# Patient Record
Sex: Male | Born: 1945
Health system: Southern US, Community
[De-identification: ages and names within clinical notes are randomized; demographics above are authoritative.]

## PROBLEM LIST (undated history)

## (undated) DIAGNOSIS — Z973 Presence of spectacles and contact lenses: Secondary | ICD-10-CM

## (undated) DIAGNOSIS — Z960 Presence of urogenital implants: Secondary | ICD-10-CM

## (undated) DIAGNOSIS — Z978 Presence of other specified devices: Secondary | ICD-10-CM

## (undated) DIAGNOSIS — I10 Essential (primary) hypertension: Secondary | ICD-10-CM

## (undated) DIAGNOSIS — N400 Enlarged prostate without lower urinary tract symptoms: Secondary | ICD-10-CM

## (undated) DIAGNOSIS — M1990 Unspecified osteoarthritis, unspecified site: Secondary | ICD-10-CM

## (undated) DIAGNOSIS — E785 Hyperlipidemia, unspecified: Secondary | ICD-10-CM

## (undated) DIAGNOSIS — E119 Type 2 diabetes mellitus without complications: Secondary | ICD-10-CM

## (undated) DIAGNOSIS — K219 Gastro-esophageal reflux disease without esophagitis: Secondary | ICD-10-CM

## (undated) DIAGNOSIS — R338 Other retention of urine: Secondary | ICD-10-CM

## (undated) HISTORY — PX: CATARACT EXTRACTION W/ INTRAOCULAR LENS IMPLANT: SHX1309

## (undated) HISTORY — PX: INGUINAL HERNIA REPAIR: SUR1180

## (undated) HISTORY — PX: RETINAL DETACHMENT SURGERY: SHX105

---

## 2000-09-16 ENCOUNTER — Ambulatory Visit (HOSPITAL_BASED_OUTPATIENT_CLINIC_OR_DEPARTMENT_OTHER): Admission: RE | Admit: 2000-09-16 | Discharge: 2000-09-16 | Payer: Self-pay | Admitting: Internal Medicine

## 2011-01-06 DIAGNOSIS — E113553 Type 2 diabetes mellitus with stable proliferative diabetic retinopathy, bilateral: Secondary | ICD-10-CM | POA: Diagnosis present

## 2015-12-03 ENCOUNTER — Encounter (HOSPITAL_BASED_OUTPATIENT_CLINIC_OR_DEPARTMENT_OTHER): Payer: Self-pay | Admitting: *Deleted

## 2015-12-03 ENCOUNTER — Emergency Department (HOSPITAL_BASED_OUTPATIENT_CLINIC_OR_DEPARTMENT_OTHER)
Admission: EM | Admit: 2015-12-03 | Discharge: 2015-12-03 | Disposition: A | Discharge status: Home / Self Care | Payer: BLUE CROSS/BLUE SHIELD | Attending: Emergency Medicine | Admitting: Emergency Medicine

## 2015-12-03 DIAGNOSIS — R339 Retention of urine, unspecified: Secondary | ICD-10-CM | POA: Diagnosis not present

## 2015-12-03 DIAGNOSIS — Z79899 Other long term (current) drug therapy: Secondary | ICD-10-CM | POA: Insufficient documentation

## 2015-12-03 DIAGNOSIS — I10 Essential (primary) hypertension: Secondary | ICD-10-CM | POA: Diagnosis not present

## 2015-12-03 DIAGNOSIS — R338 Other retention of urine: Secondary | ICD-10-CM

## 2015-12-03 DIAGNOSIS — E119 Type 2 diabetes mellitus without complications: Secondary | ICD-10-CM | POA: Insufficient documentation

## 2015-12-03 DIAGNOSIS — Z7984 Long term (current) use of oral hypoglycemic drugs: Secondary | ICD-10-CM | POA: Diagnosis not present

## 2015-12-03 HISTORY — DX: Essential (primary) hypertension: I10

## 2015-12-03 LAB — URINE MICROSCOPIC-ADD ON
RBC / HPF: NONE SEEN RBC/hpf (ref 0–5)
Squamous Epithelial / LPF: NONE SEEN
WBC, UA: NONE SEEN WBC/hpf (ref 0–5)

## 2015-12-03 LAB — URINALYSIS, ROUTINE W REFLEX MICROSCOPIC
Bilirubin Urine: NEGATIVE
Hgb urine dipstick: NEGATIVE
Ketones, ur: NEGATIVE mg/dL
LEUKOCYTES UA: NEGATIVE
Nitrite: NEGATIVE
PH: 5.5 (ref 5.0–8.0)
Protein, ur: NEGATIVE mg/dL
Specific Gravity, Urine: 1.025 (ref 1.005–1.030)

## 2015-12-03 NOTE — ED Provider Notes (Signed)
[place] DEPT [facility] Provider Note   CSN: [id number] Arrival date & time: 12/03/15  0438     History   Chief Complaint Chief Complaint  Patient presents with  . Urinary Retention    HPI David Sharp is a 70 y.o. male with a history of BPH. He is here with a 2 day history of inability to urinate. His bladder discomfort and urge to urinate have worsened and he presents with moderate discomfort. He denies fever, chills, nausea, vomiting or diarrhea. He has no previous history of urinary retention. He denies any new prescription or over-the-counter medications that may have precipitated this. Bladder scan performed by nursing staff indicates retention of over 700 milliliters of urine.  HPI  Past Medical History:  Diagnosis Date  . Diabetes mellitus without complication ([place])   . Hypertension     There are no active problems to display for this patient.   History reviewed. No pertinent surgical history.     Home Medications    Prior to Admission medications   Medication Sig Start Date End Date Taking? Authorizing Provider  lisinopril (PRINIVIL,ZESTRIL) 10 MG tablet Take 10 mg by mouth daily.   Yes Historical Provider, MD  metFORMIN (GLUCOPHAGE) 500 MG tablet Take by mouth 2 (two) times daily with a meal.   Yes Historical Provider, MD    Family History No family history on file.  Social History Social History  Substance Use Topics  . Smoking status: Not on file  . Smokeless tobacco: Not on file  . Alcohol use Not on file     Allergies   Review of patient's allergies indicates no known allergies.   Review of Systems Review of Systems  All other systems reviewed and are negative.   Physical Exam Updated Vital Signs BP 149/61 (BP Location: Left Arm)   Pulse 82   Temp 98.2 F (36.8 C) (Oral)   Resp 20   Ht 6\' 2"  (1.88 m)   Wt 280 lb (127 kg)   SpO2 99%   BMI 35.95 kg/m   Physical Exam General: Well-developed, well-nourished male in no acute  distress; appearance consistent with age of record HENT: normocephalic; atraumatic Eyes: pupils equal, round and reactive to light; extraocular muscles intact Neck: supple Heart: regular rate and rhythm Lungs: clear to auscultation bilaterally Abdomen: soft; distended bladder; bowel sounds present Extremities: No deformity; full range of motion; pulses normal Neurologic: Awake, alert and oriented; motor function intact in all extremities and symmetric; no facial droop Skin: Warm and dry Psychiatric: Normal mood and affect   ED Treatments / Results  Nursing notes and vitals signs, including pulse oximetry, reviewed.  Summary of this visit's results, reviewed by myself:  Labs:  Results for orders placed or performed during the hospital encounter of 12/03/15 (from the past 24 hour(s))  Urinalysis, Routine w reflex microscopic (not at [facility])     Status: Abnormal   Collection Time: 12/03/15  5:34 AM  Result Value Ref Range   Color, Urine YELLOW YELLOW   APPearance CLEAR CLEAR   Specific Gravity, Urine 1.025 1.005 - 1.030   pH 5.5 5.0 - 8.0   Glucose, UA >1000 (A) NEGATIVE mg/dL   Hgb urine dipstick NEGATIVE NEGATIVE   Bilirubin Urine NEGATIVE NEGATIVE   Ketones, ur NEGATIVE NEGATIVE mg/dL   Protein, ur NEGATIVE NEGATIVE mg/dL   Nitrite NEGATIVE NEGATIVE   Leukocytes, UA NEGATIVE NEGATIVE  Urine microscopic-add on     Status: Abnormal   Collection Time: 12/03/15  5:34  AM  Result Value Ref Range   Squamous Epithelial / LPF NONE SEEN NONE SEEN   WBC, UA NONE SEEN 0 - 5 WBC/hpf   RBC / HPF NONE SEEN 0 - 5 RBC/hpf   Bacteria, UA FEW (A) NONE SEEN   Casts HYALINE CASTS (A) NEGATIVE   Urine-Other MUCOUS PRESENT     5:31 AM Foley catheter placed by nursing staff, more than 1000 milliliters of clear yellow urine obtained.  Procedures (including critical care time)  Final Clinical Impressions(s) / ED Diagnoses   Final diagnoses:  Acute urinary retention       Shanon Rosser,  MD 12/03/15 [phone]

## 2015-12-03 NOTE — ED Triage Notes (Signed)
Unable to urinate since Sunday am

## 2015-12-14 ENCOUNTER — Encounter (HOSPITAL_COMMUNITY): Payer: Self-pay | Admitting: Emergency Medicine

## 2015-12-14 ENCOUNTER — Emergency Department (HOSPITAL_COMMUNITY)
Admission: EM | Admit: 2015-12-14 | Discharge: 2015-12-14 | Disposition: A | Discharge status: Home / Self Care | Payer: BLUE CROSS/BLUE SHIELD | Attending: Emergency Medicine | Admitting: Emergency Medicine

## 2015-12-14 DIAGNOSIS — Z79899 Other long term (current) drug therapy: Secondary | ICD-10-CM | POA: Diagnosis not present

## 2015-12-14 DIAGNOSIS — I10 Essential (primary) hypertension: Secondary | ICD-10-CM | POA: Diagnosis not present

## 2015-12-14 DIAGNOSIS — E119 Type 2 diabetes mellitus without complications: Secondary | ICD-10-CM | POA: Diagnosis not present

## 2015-12-14 DIAGNOSIS — R339 Retention of urine, unspecified: Secondary | ICD-10-CM | POA: Diagnosis present

## 2015-12-14 LAB — URINE MICROSCOPIC-ADD ON

## 2015-12-14 LAB — URINALYSIS, ROUTINE W REFLEX MICROSCOPIC
Bilirubin Urine: NEGATIVE
GLUCOSE, UA: 250 mg/dL — AB
Ketones, ur: NEGATIVE mg/dL
Nitrite: NEGATIVE
PH: 5.5 (ref 5.0–8.0)
PROTEIN: 30 mg/dL — AB
SPECIFIC GRAVITY, URINE: 1.014 (ref 1.005–1.030)

## 2015-12-14 MED ORDER — CIPROFLOXACIN HCL 500 MG PO TABS
500.0000 mg | ORAL_TABLET | Freq: Once | ORAL | Status: AC
  Administered 2015-12-14: 500 mg via ORAL
  Filled 2015-12-14: qty 1

## 2015-12-14 MED ORDER — CIPROFLOXACIN HCL 500 MG PO TABS: 500.0000 mg | ORAL_TABLET | Freq: Two times a day (BID) | ORAL | 0 refills | Status: DC

## 2015-12-14 NOTE — ED Notes (Signed)
>  970ml showed on the bladder scan.

## 2015-12-14 NOTE — ED Provider Notes (Signed)
[place] DEPT Provider Note   CSN: [id number] Arrival date & time: 12/14/15  1457     History   Chief Complaint Chief Complaint  Patient presents with  . Urinary Retention    HPI David Sharp is a 70 y.o. male.  HPI   70 year old male presents today with complaints of urinary retention. Patient reports that he was seen on 12/03/2015 emergency room for acute urinary retention. Patient reports he followed up with [facility], was started on Rapaflo at that time. He was again seen on September 12 with catheter removal. He notes the following day he had a urinary tract infection was started on Bactrim. Patient notes that he was able to urinate after that but has still had some difficulties. Patient reports over the last day he has decreased urine output; very little output this morning. Patient notes developing suprapubic pain radiates to his back. Denies any fever or chills nausea or vomiting, upper abdominal pain. Patient denies any history of kidney stones, denies any history of cancer.   Past Medical History:  Diagnosis Date  . Diabetes mellitus without complication ([place])   . Hypertension   . Renal disorder     There are no active problems to display for this patient.   History reviewed. No pertinent surgical history.     Home Medications    Prior to Admission medications   Medication Sig Start Date End Date Taking? Authorizing Provider  amLODipine (NORVASC) 5 MG tablet Take 5 mg by mouth daily. for blood pressure 12/04/15  Yes Historical Provider, MD  atorvastatin (LIPITOR) 10 MG tablet Take 10 mg by mouth every evening.  12/04/15  Yes Historical Provider, MD  glyBURIDE-metformin (GLUCOVANCE) 2.5-500 MG tablet Take 1 tablet by mouth 2 (two) times daily. 11/18/15  Yes Historical Provider, MD  lisinopril (PRINIVIL,ZESTRIL) 20 MG tablet Take 1 tablet by mouth every morning. 11/12/15  Yes Historical Provider, MD  pioglitazone (ACTOS) 45 MG tablet Take 45 mg by mouth  every evening.  12/04/15  Yes Historical Provider, MD  ciprofloxacin (CIPRO) 500 MG tablet Take 1 tablet (500 mg total) by mouth every 12 (twelve) hours. 12/14/15   Okey Regal, PA-C    Family History No family history on file.  Social History Social History  Substance Use Topics  . Smoking status: Not on file  . Smokeless tobacco: Not on file  . Alcohol use Not on file     Allergies   Review of patient's allergies indicates no known allergies.   Review of Systems Review of Systems  All other systems reviewed and are negative.    Physical Exam Updated Vital Signs BP (!) 160/53   Pulse 78   Temp 98.3 F (36.8 C) (Oral)   Resp 19   SpO2 100%   Physical Exam  Constitutional: He is oriented to person, place, and time. He appears well-developed and well-nourished.  HENT:  Head: Normocephalic and atraumatic.  Eyes: Conjunctivae are normal. Pupils are equal, round, and reactive to light. Right eye exhibits no discharge. Left eye exhibits no discharge. No scleral icterus.  Neck: Normal range of motion. No JVD present. No tracheal deviation present.  Pulmonary/Chest: Effort normal. No stridor.  Abdominal:  Suprapubic distention and TTP; remainder of abd soft and non-tender   Neurological: He is alert and oriented to person, place, and time. Coordination normal.  Psychiatric: He has a normal mood and affect. His behavior is normal. Judgment and thought content normal.  Nursing note and vitals reviewed.  ED Treatments / Results  Labs (all labs ordered are listed, but only abnormal results are displayed) Labs Reviewed  URINALYSIS, ROUTINE W REFLEX MICROSCOPIC (NOT AT [facility]) - Abnormal; Notable for the following:       Result Value   APPearance CLOUDY (*)    Glucose, UA 250 (*)    Hgb urine dipstick LARGE (*)    Protein, ur 30 (*)    Leukocytes, UA LARGE (*)    All other components within normal limits  URINE MICROSCOPIC-ADD ON - Abnormal; Notable for the following:     Squamous Epithelial / LPF 0-5 (*)    Bacteria, UA FEW (*)    All other components within normal limits  URINE CULTURE    EKG  EKG Interpretation None      Radiology No results found.  Procedures Procedures (including critical care time)  Medications Ordered in ED Medications  ciprofloxacin (CIPRO) tablet 500 mg (500 mg Oral Given 12/14/15 1925)     Initial Impression / Assessment and Plan / ED Course  I have reviewed the triage vital signs and the nursing notes.  Pertinent labs & imaging results that were available during my care of the patient were reviewed by me and considered in my medical decision making (see chart for details).  Clinical Course     Final Clinical Impressions(s) / ED Diagnoses   Final diagnoses:  Urinary retention   Labs:  Imaging:  Consults:  Therapeutics:  Discharge Meds:   Assessment/Plan:   Pt presents today with urinary retention. Greater than 999 ml. Foley will be placed.  Foley placed patient notes dramatic improvement in symptoms. He is afebrile nontoxic, he does have signs of urinary tract infection. He will be started on Cipro, urine culture ordered, close follow-up with urology. With the patient and his wife verbalized understanding and agreement to today's plan had no further questions or concerns at time of discharge.      New Prescriptions Discharge Medication List as of 12/14/2015  7:42 PM    START taking these medications   Details  ciprofloxacin (CIPRO) 500 MG tablet Take 1 tablet (500 mg total) by mouth every 12 (twelve) hours., Starting Sat 12/14/2015, Print         Okey Regal, PA-C 12/14/15 2158    Deno Etienne, DO 12/14/15 2240

## 2015-12-14 NOTE — Discharge Instructions (Signed)
Please read attached information. If you experience any new or worsening signs or symptoms please return to the emergency room for evaluation. Please follow-up with urology as discussed. Please use medication prescribed only as directed and discontinue taking if you have any concerning signs or symptoms.

## 2015-12-14 NOTE — ED Triage Notes (Signed)
Per pt, states urinary retention for a couple of weeks-diagnosed with UTI on Wednesday-on antibiotic--states can urinate a little but doesn't empty bladder completely

## 2015-12-17 LAB — URINE CULTURE: Culture: NO GROWTH

## 2016-01-13 ENCOUNTER — Other Ambulatory Visit: Payer: Self-pay | Admitting: Urology

## 2016-01-21 ENCOUNTER — Encounter (HOSPITAL_BASED_OUTPATIENT_CLINIC_OR_DEPARTMENT_OTHER): Payer: Self-pay | Admitting: *Deleted

## 2016-01-21 NOTE — Progress Notes (Signed)
SPOKE W/ PT WIFE.  NPO AFTER MN.  NEEDS ISTAT AND EKG.  WILL TAKE NORVASC AM DOS W/ SIPS OF WATER.

## 2016-01-26 NOTE — H&P (Signed)
HPI: David Sharp is a 70 year-old male with urinary retention.  His problem was diagnosed 12/03/2015. His current symptoms did not begin after he had a surgical procedure. He currently has an indwelling catheter. His urinary retention is being treated with foley catheter. Patient denies suprapubic tube, intemittent catheterization, flomax, hytrin, cardura, uroxatrol, rapaflo, avodart, and proscar.   He does not have a good size and strength to his urinary stream.   He has not previously had an indwelling catheter in for more than two weeks at a time.   He developed urinary retention and was seen on 12/03/15. Foley catheter was placed and he was started on Rapaflo. He underwent a voiding trial on 12/10/15 and did well but the following day he was seen for symptoms of retention however his PVR was 0. He was maintained on Rapaflo however he presented to the emergency room on 12/14/15 with greater than 1 L in his bladder. A Foley catheter was reinserted.       ALLERGIES: No Known Drug Allergies    MEDICATIONS: Lisinopril 1 PO Daily  Metformin Hcl 1 PO Daily  Rapaflo 8 mg capsule 1 capsule PO Daily  Amlodipine Besylate 1 PO Daily  Aspir 81 81 mg tablet, delayed release  Atorvastatin Calcium 1 PO Daily  Daily Multiple Vitamin  Fish Oil     GU PSH: Complex cystometrogram, w/ void pressure and urethral pressure profile studies, any technique - 01/02/2016 Complex Uroflow - 01/02/2016 Emg surf Electrd - 01/02/2016 Hernia Repair Inject For cystogram - 01/02/2016 Intrabd voidng Press - 01/02/2016 Prostate Needle Biopsy, pt states it was done at [facility]: negative - 2014    NON-GU PSH: Cataract Surgery Repair Retinal Detach, Cplx    GU PMH: Urinary Retention, drug induced (Stable), We discussed the options moving forward and since he has had a catheter in and been on Rapaflo I told him we could take the catheter out today for a voiding trial but there was some risk that if he redeveloped urinary  retention he might have reviewed the emergency room. The second option was to take the catheter out in the morning at home to see if he urinated throughout the day and if not come in for replacement of his catheter. The third option we discussed was to learn intermittent self-catheterization in order to eliminate the catheter. He said he does not mind particularly and he is getting more sleep at night. Finally his wife wanted to know what I thought the cause of his urinary retention was and I told her that I felt it was most likely due to his BPH especially with his symptoms prior to this occurring. He had reported nocturia 1-2 times with a weak stream and some hesitancy. - 12/17/2015 Acute Cystitis (Acute), Culture urine. Empirically begin SMZ-TMP DS 1 po BID X 7 days Ceftriaxone 1 GM IM today - 12/11/2015 Gross hematuria (Acute), Hemorrhagic cystitis. Will culture urine. - 12/11/2015 Urinary Retention (Improving, Chronic), Will continue with Rapaflo 8 mg 1 po daily. If unable to void in 6 hrs RTC for PVR. Instructed to push po fluids - 12/10/2015, (Acute), Rapaflo 8 mg 1 po daily RTC in 2 days for attempted voiding trail. If he fails voiding trail will need UDS and cysto w/MD, - 12/06/2015      PMH Notes: Elevated PSA: He reported that in about 2013 he underwent TRUS/BX due to an elevated PSA. He indicated that this was done in [facility] in that his pathology was benign.  He does have a markedly enlarged prostate by DRE.     NON-GU PMH: Basal cell carcinoma of skin of nose Diabetes Type 2 Hypertension    FAMILY HISTORY: Death - Father, Mother Diabetes - Mother   SOCIAL HISTORY: Marital Status: Married Current Smoking Status: Patient has never smoked.  Social Drinker.  Drinks 2 caffeinated drinks per day. Patient's occupation is/was [employer].     Notes: 3 stepchildren   REVIEW OF SYSTEMS:    GU Review Male:   Patient reports frequent urination, hard to postpone urination, get up at  night to urinate, trouble starting your stream, have to strain to urinate , and erection problems. Patient denies burning/ pain with urination, leakage of urine, stream starts and stops, and penile pain.  Gastrointestinal (Upper):   Patient denies nausea, vomiting, and indigestion/ heartburn.  Gastrointestinal (Lower):   Patient denies diarrhea and constipation.  Constitutional:   Patient denies fever, night sweats, weight loss, and fatigue.  Skin:   Patient denies skin rash/ lesion and itching.  Eyes:   Patient denies blurred vision and double vision.  Ears/ Nose/ Throat:   Patient denies sore throat and sinus problems.  Hematologic/Lymphatic:   Patient denies swollen glands and easy bruising.  Cardiovascular:   Patient denies leg swelling and chest pains.  Respiratory:   Patient denies shortness of breath and cough.  Endocrine:   Patient denies excessive thirst.  Musculoskeletal:   Patient denies back pain and joint pain.  Neurological:   Patient denies headaches and dizziness.  Psychologic:   Patient denies depression and anxiety.   VITAL SIGNS:  Weight 280 lb / 127.01 kg  Height 74 in / 187.96 cm  BP 154/74 mmHg  Pulse 66 /min  Temperature 98.1 F / 37 C  BMI 35.9 kg/m     GU PHYSICAL EXAMINATION:    Penis: Penile foley catheter present. Circumcised, no foreskin warts, no cracks.   Prostate: Prostate 4 + size. Left lobe normal consistency, right lobe normal consistency. Symmetrical lobes. No prostate nodule. Left lobe no tenderness, right lobe no tenderness.    MULTI-SYSTEM PHYSICAL EXAMINATION:    Constitutional: Obese. No physical deformities. Normally developed. Good grooming.   Respiratory: No labored breathing, no use of accessory muscles.   Cardiovascular: Normal temperature, normal extremity pulses, no swelling, no varicosities.   Neurologic / Psychiatric: Oriented to time, oriented to place, oriented to person. No depression, no anxiety, no agitation.   Gastrointestinal:  Obese abdomen. No mass, no tenderness, no rigidity.     PAST DATA REVIEWED:  Source Of History:  Patient  Records Review:   Previous Patient Records  Urodynamics Review:   Review Urodynamics Tests  Notes:                     Urodynamics 01/02/16: Study was performed to evaluate urinary retention.  CMG: He was found to have a maximum cystometric capacity of 645 cc. There was normal bladder sensation. No detrusor instability was noted.  Pressure-flow: He was able to generate a voluntary contraction but was unable to void. He was able to reach contraction pressures of 69 cm H2O.  EMG: Normal  Fluoroscopy: Indentation of the bladder base by a large prostate with mild trabeculation.   Impression: His study was essentially normal other than obstruction with a good detrusor contraction generated. He would benefit from outlet resistance surgery.     PROCEDURES:          Urinalysis w/Scope - 81001 Dipstick  Dipstick Cont'd Micro  Specimen: Voided Bilirubin: Neg WBC/hpf: >60/hpf  Color: Yellow Ketones: Neg RBC/hpf: 3-10/hpf  Appearance: Cloudy Blood: 2+ Bacteria: Moderate (26-50/hpf)  Specific Gravity: 1.020 Protein: 3+ Cystals: NS (Not Seen)  pH: 5.0 Urobilinogen: 0.2 Casts: NS (Not Seen)  Glucose: Neg Nitrites: Neg Trichomonas: Not Present    Leukocyte Esterase: 3+ Mucous: Not Present      Epithelial Cells: 0-5/hpf      Yeast: NS (Not Seen)      Sperm: Not Present    ASSESSMENT:      ICD-10 Details  1 GU:   Urinary Retention, drug induced - R33.0 We discussed the options which would include continued Foley catheterization versus suprapubic tube placement. I also have discussed nonsurgical management with intermittent self-catheterization. Finally I discussed outlet resistance surgery and the various types. We primarily discussed TURP and I went over the procedure with him in detail, its risks and complications, the probability of success and the anticipated postoperative course. He  understands and has elected to proceed with surgery.              Notes:   I went over the results of the urodynamic study today. We discussed each of the findings of the study and the normal/anticipated results as well as the significance of the results of this study in relation to the reported subjective symptoms.   He has a good detrusor contraction with outlet obstruction.   He had a positive culture the time of his urodynamic study. Based on these culture results I have prescribed Septra DS which she will begin taking one week prior to his planned surgery.       PLAN:   TURP

## 2016-01-27 ENCOUNTER — Encounter (HOSPITAL_COMMUNITY)
Admission: RE | Disposition: A | Discharge status: Home / Self Care | Payer: Self-pay | Source: Ambulatory Visit | Attending: Urology

## 2016-01-27 ENCOUNTER — Ambulatory Visit (HOSPITAL_BASED_OUTPATIENT_CLINIC_OR_DEPARTMENT_OTHER): Payer: BLUE CROSS/BLUE SHIELD | Admitting: Anesthesiology

## 2016-01-27 ENCOUNTER — Ambulatory Visit (HOSPITAL_BASED_OUTPATIENT_CLINIC_OR_DEPARTMENT_OTHER)
Admission: RE | Admit: 2016-01-27 | Discharge: 2016-01-28 | Disposition: A | Discharge status: Home / Self Care | Payer: BLUE CROSS/BLUE SHIELD | Source: Ambulatory Visit | Attending: Urology | Admitting: Urology

## 2016-01-27 ENCOUNTER — Encounter (HOSPITAL_BASED_OUTPATIENT_CLINIC_OR_DEPARTMENT_OTHER): Payer: Self-pay | Admitting: *Deleted

## 2016-01-27 DIAGNOSIS — E669 Obesity, unspecified: Secondary | ICD-10-CM | POA: Insufficient documentation

## 2016-01-27 DIAGNOSIS — Z6836 Body mass index (BMI) 36.0-36.9, adult: Secondary | ICD-10-CM | POA: Insufficient documentation

## 2016-01-27 DIAGNOSIS — N138 Other obstructive and reflux uropathy: Secondary | ICD-10-CM | POA: Insufficient documentation

## 2016-01-27 DIAGNOSIS — Z23 Encounter for immunization: Secondary | ICD-10-CM | POA: Insufficient documentation

## 2016-01-27 DIAGNOSIS — N401 Enlarged prostate with lower urinary tract symptoms: Secondary | ICD-10-CM | POA: Insufficient documentation

## 2016-01-27 DIAGNOSIS — R338 Other retention of urine: Secondary | ICD-10-CM | POA: Diagnosis not present

## 2016-01-27 DIAGNOSIS — Z7982 Long term (current) use of aspirin: Secondary | ICD-10-CM | POA: Diagnosis not present

## 2016-01-27 DIAGNOSIS — I10 Essential (primary) hypertension: Secondary | ICD-10-CM | POA: Diagnosis not present

## 2016-01-27 DIAGNOSIS — K219 Gastro-esophageal reflux disease without esophagitis: Secondary | ICD-10-CM | POA: Insufficient documentation

## 2016-01-27 DIAGNOSIS — Z7984 Long term (current) use of oral hypoglycemic drugs: Secondary | ICD-10-CM | POA: Insufficient documentation

## 2016-01-27 DIAGNOSIS — E119 Type 2 diabetes mellitus without complications: Secondary | ICD-10-CM | POA: Diagnosis not present

## 2016-01-27 DIAGNOSIS — M1990 Unspecified osteoarthritis, unspecified site: Secondary | ICD-10-CM | POA: Diagnosis not present

## 2016-01-27 HISTORY — DX: Unspecified osteoarthritis, unspecified site: M19.90

## 2016-01-27 HISTORY — PX: TRANSURETHRAL RESECTION OF PROSTATE: SHX73

## 2016-01-27 HISTORY — DX: Presence of other specified devices: Z97.8

## 2016-01-27 HISTORY — DX: Presence of spectacles and contact lenses: Z97.3

## 2016-01-27 HISTORY — DX: Gastro-esophageal reflux disease without esophagitis: K21.9

## 2016-01-27 HISTORY — DX: Benign prostatic hyperplasia without lower urinary tract symptoms: N40.0

## 2016-01-27 HISTORY — DX: Presence of urogenital implants: Z96.0

## 2016-01-27 HISTORY — DX: Other retention of urine: R33.8

## 2016-01-27 HISTORY — DX: Type 2 diabetes mellitus without complications: E11.9

## 2016-01-27 HISTORY — DX: Hyperlipidemia, unspecified: E78.5

## 2016-01-27 LAB — POCT I-STAT 4, (NA,K, GLUC, HGB,HCT)
Glucose, Bld: 221 mg/dL — ABNORMAL HIGH (ref 65–99)
HCT: 33 % — ABNORMAL LOW (ref 39.0–52.0)
Hemoglobin: 11.2 g/dL — ABNORMAL LOW (ref 13.0–17.0)
Potassium: 5 mmol/L (ref 3.5–5.1)
Sodium: 138 mmol/L (ref 135–145)

## 2016-01-27 LAB — GLUCOSE, CAPILLARY: Glucose-Capillary: 225 mg/dL — ABNORMAL HIGH (ref 65–99)

## 2016-01-27 SURGERY — TURP (TRANSURETHRAL RESECTION OF PROSTATE)
Anesthesia: General | Site: Prostate

## 2016-01-27 MED ORDER — METOCLOPRAMIDE HCL 5 MG/ML IJ SOLN
INTRAMUSCULAR | Status: DC | PRN
  Administered 2016-01-27: 10 mg via INTRAVENOUS

## 2016-01-27 MED ORDER — MIDAZOLAM HCL 2 MG/2ML IJ SOLN
INTRAMUSCULAR | Status: AC
  Filled 2016-01-27: qty 2

## 2016-01-27 MED ORDER — HYDROCODONE-ACETAMINOPHEN 10-325 MG PO TABS: 1.0000 | ORAL_TABLET | ORAL | 0 refills | Status: DC | PRN

## 2016-01-27 MED ORDER — SODIUM CHLORIDE 0.9 % IR SOLN
Status: DC | PRN
  Administered 2016-01-27: 18000 mL

## 2016-01-27 MED ORDER — PROMETHAZINE HCL 25 MG/ML IJ SOLN
6.2500 mg | INTRAMUSCULAR | Status: DC | PRN
  Filled 2016-01-27: qty 1

## 2016-01-27 MED ORDER — PROPOFOL 10 MG/ML IV BOLUS
INTRAVENOUS | Status: AC
  Filled 2016-01-27: qty 20

## 2016-01-27 MED ORDER — CIPROFLOXACIN HCL 500 MG PO TABS: 500.0000 mg | ORAL_TABLET | Freq: Two times a day (BID) | ORAL | 0 refills | Status: DC

## 2016-01-27 MED ORDER — FENTANYL CITRATE (PF) 100 MCG/2ML IJ SOLN
INTRAMUSCULAR | Status: DC | PRN
  Administered 2016-01-27 (×2): 25 ug via INTRAVENOUS
  Administered 2016-01-27: 50 ug via INTRAVENOUS

## 2016-01-27 MED ORDER — ONDANSETRON HCL 4 MG/2ML IJ SOLN
INTRAMUSCULAR | Status: DC | PRN
  Administered 2016-01-27: 4 mg via INTRAVENOUS

## 2016-01-27 MED ORDER — CIPROFLOXACIN IN D5W 400 MG/200ML IV SOLN
400.0000 mg | Freq: Two times a day (BID) | INTRAVENOUS | Status: AC
  Administered 2016-01-27 – 2016-01-28 (×2): 400 mg via INTRAVENOUS
  Filled 2016-01-27 (×2): qty 200

## 2016-01-27 MED ORDER — INFLUENZA VAC SPLIT QUAD 0.5 ML IM SUSY
0.5000 mL | PREFILLED_SYRINGE | INTRAMUSCULAR | Status: AC
  Administered 2016-01-28: 0.5 mL via INTRAMUSCULAR

## 2016-01-27 MED ORDER — PHENAZOPYRIDINE HCL 200 MG PO TABS: 200.0000 mg | ORAL_TABLET | Freq: Three times a day (TID) | ORAL | 0 refills | Status: DC | PRN

## 2016-01-27 MED ORDER — ONDANSETRON HCL 4 MG/2ML IJ SOLN: 4.0000 mg | INTRAMUSCULAR | Status: DC | PRN

## 2016-01-27 MED ORDER — EPHEDRINE SULFATE 50 MG/ML IJ SOLN
INTRAMUSCULAR | Status: DC | PRN
  Administered 2016-01-27 (×2): 10 mg via INTRAVENOUS

## 2016-01-27 MED ORDER — LIDOCAINE 2% (20 MG/ML) 5 ML SYRINGE
INTRAMUSCULAR | Status: AC
  Filled 2016-01-27: qty 5

## 2016-01-27 MED ORDER — HYDROMORPHONE HCL 1 MG/ML IJ SOLN: 0.5000 mg | INTRAMUSCULAR | Status: DC | PRN

## 2016-01-27 MED ORDER — ACETAMINOPHEN 325 MG PO TABS: 650.0000 mg | ORAL_TABLET | ORAL | Status: DC | PRN

## 2016-01-27 MED ORDER — CIPROFLOXACIN IN D5W 400 MG/200ML IV SOLN
INTRAVENOUS | Status: AC
  Filled 2016-01-27: qty 200

## 2016-01-27 MED ORDER — HYDROCODONE-ACETAMINOPHEN 5-325 MG PO TABS
1.0000 | ORAL_TABLET | ORAL | Status: DC | PRN
  Administered 2016-01-27: 1 via ORAL
  Filled 2016-01-27: qty 1

## 2016-01-27 MED ORDER — PHENYLEPHRINE HCL 10 MG/ML IJ SOLN
INTRAMUSCULAR | Status: DC | PRN
  Administered 2016-01-27 (×4): 80 ug via INTRAVENOUS

## 2016-01-27 MED ORDER — DEXAMETHASONE SODIUM PHOSPHATE 10 MG/ML IJ SOLN
INTRAMUSCULAR | Status: AC
  Filled 2016-01-27: qty 1

## 2016-01-27 MED ORDER — FENTANYL CITRATE (PF) 100 MCG/2ML IJ SOLN
25.0000 ug | INTRAMUSCULAR | Status: DC | PRN
  Filled 2016-01-27: qty 1

## 2016-01-27 MED ORDER — BACITRACIN-NEOMYCIN-POLYMYXIN 400-5-5000 EX OINT: 1.0000 "application " | TOPICAL_OINTMENT | Freq: Three times a day (TID) | CUTANEOUS | Status: DC | PRN

## 2016-01-27 MED ORDER — EPHEDRINE 5 MG/ML INJ
INTRAVENOUS | Status: AC
  Filled 2016-01-27: qty 10

## 2016-01-27 MED ORDER — LACTATED RINGERS IV SOLN
INTRAVENOUS | Status: DC
  Administered 2016-01-27 (×2): via INTRAVENOUS
  Filled 2016-01-27: qty 1000

## 2016-01-27 MED ORDER — FENTANYL CITRATE (PF) 100 MCG/2ML IJ SOLN
INTRAMUSCULAR | Status: AC
  Filled 2016-01-27: qty 2

## 2016-01-27 MED ORDER — PROPOFOL 10 MG/ML IV BOLUS
INTRAVENOUS | Status: DC | PRN
  Administered 2016-01-27: 200 mg via INTRAVENOUS
  Administered 2016-01-27: 50 mg via INTRAVENOUS

## 2016-01-27 MED ORDER — CIPROFLOXACIN IN D5W 400 MG/200ML IV SOLN
400.0000 mg | INTRAVENOUS | Status: AC
  Administered 2016-01-27: 400 mg via INTRAVENOUS
  Filled 2016-01-27: qty 200

## 2016-01-27 MED ORDER — BELLADONNA ALKALOIDS-OPIUM 16.2-60 MG RE SUPP: 1.0000 | Freq: Four times a day (QID) | RECTAL | Status: DC | PRN

## 2016-01-27 MED ORDER — MIDAZOLAM HCL 5 MG/5ML IJ SOLN
INTRAMUSCULAR | Status: DC | PRN
  Administered 2016-01-27: 2 mg via INTRAVENOUS

## 2016-01-27 MED ORDER — SODIUM CHLORIDE 0.9 % IV SOLN
INTRAVENOUS | Status: DC
  Administered 2016-01-27 – 2016-01-28 (×2): via INTRAVENOUS

## 2016-01-27 MED ORDER — LIDOCAINE HCL (CARDIAC) 20 MG/ML IV SOLN
INTRAVENOUS | Status: DC | PRN
  Administered 2016-01-27: 100 mg via INTRAVENOUS

## 2016-01-27 MED ORDER — ONDANSETRON HCL 4 MG/2ML IJ SOLN
INTRAMUSCULAR | Status: AC
  Filled 2016-01-27: qty 2

## 2016-01-27 SURGICAL SUPPLY — 28 items
BAG DRAIN URO-CYSTO SKYTR STRL (DRAIN) ×2 IMPLANT
BAG URINE DRAINAGE (UROLOGICAL SUPPLIES) ×2 IMPLANT
CATH FOLEY 3WAY 20FR (CATHETERS) ×2 IMPLANT
CLOTH BEACON ORANGE TIMEOUT ST (SAFETY) ×2 IMPLANT
ELECT REM PT RETURN 9FT ADLT (ELECTROSURGICAL)
ELECTRODE REM PT RTRN 9FT ADLT (ELECTROSURGICAL) IMPLANT
EVACUATOR MICROVAS BLADDER (UROLOGICAL SUPPLIES) ×2 IMPLANT
GLOVE BIO SURGEON STRL SZ8 (GLOVE) ×2 IMPLANT
GLOVE BIOGEL PI IND STRL 7.0 (GLOVE) ×1 IMPLANT
GLOVE BIOGEL PI INDICATOR 7.0 (GLOVE) ×1
GLOVE ECLIPSE 7.0 STRL STRAW (GLOVE) ×2 IMPLANT
GOWN STRL REUS W/ TWL LRG LVL3 (GOWN DISPOSABLE) ×1 IMPLANT
GOWN STRL REUS W/ TWL XL LVL3 (GOWN DISPOSABLE) ×1 IMPLANT
GOWN STRL REUS W/TWL LRG LVL3 (GOWN DISPOSABLE) ×1
GOWN STRL REUS W/TWL XL LVL3 (GOWN DISPOSABLE) ×1
HOLDER FOLEY CATH W/STRAP (MISCELLANEOUS) ×2 IMPLANT
IV NS IRRIG 3000ML ARTHROMATIC (IV SOLUTION) ×12 IMPLANT
KIT ROOM TURNOVER WOR (KITS) ×2 IMPLANT
LOOP CUT BIPOLAR 24F LRG (ELECTROSURGICAL) ×4 IMPLANT
MANIFOLD NEPTUNE II (INSTRUMENTS) ×2 IMPLANT
NS IRRIG 500ML POUR BTL (IV SOLUTION) ×4 IMPLANT
PACK CYSTO (CUSTOM PROCEDURE TRAY) ×2 IMPLANT
PLUG CATH AND CAP STER (CATHETERS) IMPLANT
SYR 30ML LL (SYRINGE) IMPLANT
SYRINGE IRR TOOMEY STRL 70CC (SYRINGE) ×2 IMPLANT
TUBE CONNECTING 12X1/4 (SUCTIONS) IMPLANT
WATER STERILE IRR 3000ML UROMA (IV SOLUTION) IMPLANT
WATER STERILE IRR 500ML POUR (IV SOLUTION) ×2 IMPLANT

## 2016-01-27 NOTE — Anesthesia Procedure Notes (Signed)
Procedure Name: LMA Insertion Date/Time: 01/27/2016 11:11 AM Performed by: Franne Grip Pre-anesthesia Checklist: Patient identified, Emergency Drugs available, Suction available and Patient being monitored Patient Re-evaluated:Patient Re-evaluated prior to inductionOxygen Delivery Method: Circle system utilized Preoxygenation: Pre-oxygenation with 100% oxygen Intubation Type: IV induction Ventilation: Mask ventilation without difficulty LMA: LMA inserted LMA Size: 5.0 Number of attempts: 1 Airway Equipment and Method: Bite block Placement Confirmation: positive ETCO2 Tube secured with: Tape Dental Injury: Teeth and Oropharynx as per pre-operative assessment

## 2016-01-27 NOTE — Anesthesia Postprocedure Evaluation (Signed)
Anesthesia Post Note  Patient: David Sharp  Procedure(s) Performed: Procedure(s) (LRB): TRANSURETHRAL RESECTION OF THE PROSTATE (TURP) WITH GYRUS (N/A)  Patient location during evaluation: PACU Anesthesia Type: General Level of consciousness: awake and alert Pain management: pain level controlled Vital Signs Assessment: post-procedure vital signs reviewed and stable Respiratory status: spontaneous breathing, nonlabored ventilation, respiratory function stable and patient connected to nasal cannula oxygen Cardiovascular status: blood pressure returned to baseline and stable Postop Assessment: no signs of nausea or vomiting Anesthetic complications: no    Last Vitals:  Vitals:   01/27/16 1229 01/27/16 1300  BP: 133/63 131/61  Pulse: 80 81  Resp: 12 11  Temp: (!) 36 C     Last Pain:  Vitals:   01/27/16 1300  TempSrc:   PainSc: 0-No pain                 Darsha Zumstein J

## 2016-01-27 NOTE — Progress Notes (Signed)
Taking over care of patient. Agree with previous RN assessment. Patient denies any needs at this time. Will continue to monitor.  

## 2016-01-27 NOTE — Transfer of Care (Signed)
  Last Vitals:  Vitals:   01/27/16 0917  BP: (!) 152/59  Pulse: 78  Resp: 18  Temp: 36.8 C    Last Pain:  Vitals:   01/27/16 0917  TempSrc: Oral      Patients Stated Pain Goal: 5 (01/27/16 0946)  Immediate Anesthesia Transfer of Care Note  Patient: David Sharp  Procedure(s) Performed: Procedure(s) (LRB): TRANSURETHRAL RESECTION OF THE PROSTATE (TURP) WITH GYRUS (N/A)  Patient Location: PACU  Anesthesia Type: General  Level of Consciousness: awake, alert  and oriented  Airway & Oxygen Therapy: Patient Spontanous Breathing and Patient connected to face mask oxygen  Post-op Assessment: Report given to PACU RN and Post -op Vital signs reviewed and stable  Post vital signs: Reviewed and stable  Complications: No apparent anesthesia complications

## 2016-01-27 NOTE — Anesthesia Preprocedure Evaluation (Signed)
Anesthesia Evaluation  Patient identified by MRN, date of birth, ID band Patient awake    Reviewed: Allergy & Precautions, NPO status , Patient's Chart, lab work & pertinent test results  Airway Mallampati: II  TM Distance: >3 FB Neck ROM: Full    Dental no notable dental hx.    Pulmonary neg pulmonary ROS,    Pulmonary exam normal breath sounds clear to auscultation       Cardiovascular Exercise Tolerance: Good hypertension, Pt. on medications Normal cardiovascular exam Rhythm:Regular Rate:Normal     Neuro/Psych negative neurological ROS  negative psych ROS   GI/Hepatic Neg liver ROS, GERD  Medicated,  Endo/Other  diabetes, Type 2, Oral Hypoglycemic Agents  Renal/GU negative Renal ROS  negative genitourinary   Musculoskeletal  (+) Arthritis ,   Abdominal (+) + obese,   Peds negative pediatric ROS (+)  Hematology negative hematology ROS (+)   Anesthesia Other Findings   Reproductive/Obstetrics negative OB ROS                             Anesthesia Physical Anesthesia Plan  ASA: III  Anesthesia Plan: General   Post-op Pain Management:    Induction: Intravenous  Airway Management Planned: LMA  Additional Equipment:   Intra-op Plan:   Post-operative Plan: Extubation in OR  Informed Consent: I have reviewed the patients History and Physical, chart, labs and discussed the procedure including the risks, benefits and alternatives for the proposed anesthesia with the patient or authorized representative who has indicated his/her understanding and acceptance.   Dental advisory given  Plan Discussed with: CRNA  Anesthesia Plan Comments:         Anesthesia Quick Evaluation

## 2016-01-27 NOTE — Progress Notes (Signed)
Night of surgery note  Subjective: The patient is doing well.  No complaints other than what he describes as slight bladder twinges that last for only about half a second. Otherwise no painor complaint.  Objective: Vital signs in last 24 hours: Temp:  [96.8 F (36 C)-98.4 F (36.9 C)] 98.4 F (36.9 C) (10/30 1403) Pulse Rate:  [78-84] 84 (10/30 1403) Resp:  [11-18] 18 (10/30 1403) BP: (131-152)/(53-63) 137/53 (10/30 1403) SpO2:  [98 %-100 %] 100 % (10/30 1403) Weight:  [284 lb 5 oz (129 kg)] 284 lb 5 oz (129 kg) (10/30 0917)  Intake/Output this shift: Total I/O In: 3846.7 [I.V.:2071.7; Other:1775] Out: [id number] [Urine:5400; Blood:20]  Physical Exam:  General: Alert and oriented. Abdomen: Soft, Nondistended. Foley catheter draining bloody urine with no clots on CBI.  Lab Results:  Recent Labs  01/27/16 1007  HGB 11.2*  HCT 33.0*    Assessment/Plan: 1) Continue to monitor 2) Per orders   Ayvin Lipinski C. Karsten Ro, MD  Claybon Jabs 01/27/2016, 5:44 PM

## 2016-01-27 NOTE — Op Note (Signed)
PATIENT:  David Sharp  PRE-OPERATIVE DIAGNOSIS: BPH with outlet obstruction and urinary retention  POST-OPERATIVE DIAGNOSIS: Same  PROCEDURE: TURP  SURGEON:  Claybon Jabs  INDICATION: David Sharp is a 70 year old male who developed urinary retention after a surgery. He was placed on alpha blockade therapy and initially passed a voiding trial but then redeveloped urinary retention. Evaluation with urodynamics revealed a good, strong detrusor contraction with outlet obstruction. We discussed the treatment options and he has elected to proceed with a transurethral resection of his prostate. A preoperative urine culture was positive and he was placed on oral antibiotics and maintained on this until his day of surgery.  ANESTHESIA:  General  EBL:  Minimal  DRAINS: 20 French three-way Foley catheter  SPECIMEN:  Prostate chips to pathology  After informed consent the patient was brought to the major OR and placed on the table. He was administered general anesthesia and then moved to the dorsal lithotomy position. His genitalia was sterilely prepped and draped and an official timeout was then performed.  Initially the 26 French resectoscope sheath with the visual obturator was passed into the bladder and the obturator removed. I then inserted the resectoscope element with 30 lens and  performed a systematic inspection of the bladder. I noted the bladder had 4+ trabeculation but was free of any tumor stones or inflammatory lesions. The ureteral orifices were noted to be of normal configuration and position. Withdrawing the scope into the prostatic urethra I noted obstructing bilobar hypertrophy with elongation of the prostatic urethra and a median lobe component.  Resection was then begun I. first resected the median lobe in the midline down to the level of the bladder neck. I then began resecting the left lobe of the prostate by resecting first from the level of the bladder neck back to the  level of the Veru at the 5:00 position and then progressed in a counterclockwise direction resecting all of the adenomatous tissue of the left lobe down to the surgical capsule. Bleeding points were cauterized as they were encountered. I then turned my attention to the right lobe of the prostate and it was resected in an identical fashion. Tissue in the area of the apex was then resected circumferentially with care being taken to maintain the resection proximal to the Veru at all times. The prostatic chips were then flushed into the bladder and the Microvasive evacuator was then used to evacuate all chips from the bladder. Reinspection of the bladder revealed the mucosa to be intact, the ureteral orifices intact as well and well away from the bladder neck and area of resection. There were no prostatic chips remaining within the bladder. The prostatic capsule was intact throughout with no perforation and there was no active bleeding noted at the end of the procedure.  The resectoscope was therefore removed and the 20 French three-way Foley catheter was then inserted and balloon was filled to 30 cc. This was placed on mild traction and the bladder was irrigated with the irrigant returning clear. The catheter was then hooked to closed system drainage and continuous irrigation and the patient was awakened and taken to recovery room in stable and satisfactory condition. He tolerated the procedure well and there were no intraoperative complications.  PLAN OF CARE: Observation overnight with anticipated discharge in the morning.  PATIENT DISPOSITION:  PACU - Hemodynamically stable.

## 2016-01-27 NOTE — OR Nursing (Signed)
PATIENT ARRIVED TO OR 1 WITH FOLEY LEG BAG REMOVED AT START OF CASE AND DISCARDED 200 ML OF URINE.

## 2016-01-27 NOTE — Discharge Instructions (Signed)

## 2016-01-28 ENCOUNTER — Encounter (HOSPITAL_BASED_OUTPATIENT_CLINIC_OR_DEPARTMENT_OTHER): Payer: Self-pay | Admitting: Urology

## 2016-01-28 DIAGNOSIS — N401 Enlarged prostate with lower urinary tract symptoms: Secondary | ICD-10-CM | POA: Diagnosis not present

## 2016-01-28 NOTE — Care Management Note (Signed)
Case Management Note  Patient Details  Name: David Sharp MRN: [id number] Date of Birth: May 15, 1945  Subjective/Objective: 70 y/o m admitted w/BPH. From home.No CM needs.                   Action/Plan:d/c home.   Expected Discharge Date:                  Expected Discharge Plan:  Home/Self Care  In-House Referral:     Discharge planning Services  CM Consult  Post Acute Care Choice:    Choice offered to:     DME Arranged:    DME Agency:     HH Arranged:    [place] Agency:     Status of Service:  Completed, signed off  If discussed at [employer] of Stay Meetings, dates discussed:    Additional Comments:  Dessa Phi, RN 01/28/2016, 9:49 AM

## 2016-01-28 NOTE — Discharge Summary (Signed)
Physician Discharge Summary  Patient ID: David Sharp MRN: [id number] DOB/AGE: [birth year] 70 y.o.  Admit date: 01/27/2016 Discharge date: 01/28/2016  Admission Diagnoses: BPH with acute uniary retention  Discharge Diagnoses:  Active Problems:   BPH with urinary obstruction   Discharged Condition: good  Hospital Course: he was admitted to the hospital for an elective transurethral resection of the prostate due to a long-standing history of BPH with urinary retention that did not respond to alpha blockade therapy and failed voiding trials.  Urodynamics revealed normal bladder with high pressure voiding consistent with outlet obstruction.  He underwent a transurethral resection of his prostate.  This was uncomplicated and postoperatively he was noted to be doing well the evening of his surgery with no pain.  He was maintained on continuous bladder irrigation overnight and the following morning his urine had cleared and his Foley catheter was removed in preparation for voiding trial.  He is tolerating regular diet.  He has no complaints at this time.  He will be discharged home once he urinates.  He will follow-up in the office as an outpatient in 1-2 weeks and was given prescriptions for pain medication, further short course of antibiotics due to his preop culture having been positive and Pyridium.  Significant Diagnostic Studies: No results found.  Discharge Exam: Blood pressure (!) 124/52, pulse 70, temperature 98.5 F (36.9 C), temperature source Oral, resp. rate 16, height 6\' 2"  (1.88 m), weight 284 lb 5 oz (129 kg), SpO2 98 %.  Alert, awake and in no distress. Abdomen soft and nontender without mass. Foley catheter removed.  No blood at meatus.   Disposition: 01-Home or Self Care  Discharge Instructions    Discharge patient    Complete by:  As directed        Medication List    TAKE these medications   amLODipine 5 MG tablet Commonly known as:  NORVASC Take 5 mg by mouth  every morning. for blood pressure   aspirin EC 81 MG tablet Take 81 mg by mouth daily.   atorvastatin 10 MG tablet Commonly known as:  LIPITOR Take 10 mg by mouth every evening.   ciprofloxacin 500 MG tablet Commonly known as:  CIPRO Take 500 mg by mouth 2 (two) times daily. What changed:  Another medication with the same name was added. Make sure you understand how and when to take each.   ciprofloxacin 500 MG tablet Commonly known as:  CIPRO Take 1 tablet (500 mg total) by mouth 2 (two) times daily. What changed:  You were already taking a medication with the same name, and this prescription was added. Make sure you understand how and when to take each.   Fish Oil 1200 MG Caps Take 1-2 capsules by mouth 2 (two) times daily.   glyBURIDE-metformin 2.5-500 MG tablet Commonly known as:  GLUCOVANCE Take 1 tablet by mouth 2 (two) times daily.   HYDROcodone-acetaminophen 10-325 MG tablet Commonly known as:  NORCO Take 1-2 tablets by mouth every 4 (four) hours as needed for moderate pain. Maximum dose per 24 hours -8 pills.   lisinopril 20 MG tablet Commonly known as:  PRINIVIL,ZESTRIL Take 20 mg by mouth every morning.   multivitamin tablet Take 1 tablet by mouth daily.   phenazopyridine 200 MG tablet Commonly known as:  PYRIDIUM Take 1 tablet (200 mg total) by mouth 3 (three) times daily as needed for pain.   pioglitazone 45 MG tablet Commonly known as:  ACTOS Take 45 mg by mouth  every evening.   RAPAFLO 8 MG Caps capsule Generic drug:  silodosin Take 8 mg by mouth daily.   sulfamethoxazole-trimethoprim 800-160 MG tablet Commonly known as:  BACTRIM DS,SEPTRA DS Take 1 tablet by mouth 2 (two) times daily.      [facility] On 02/04/2016.   Why:  For your appiontment at 2:45 Contact information: [place] [address] [phone]           Signed: Claybon Jabs 01/28/2016, 7:52 AM

## 2016-01-28 NOTE — Plan of Care (Signed)
Wife of patient requests anti-anxiety medication for patient.

## 2016-01-28 NOTE — Progress Notes (Signed)
After catheter was removed, patient was unable to void and was becoming very uncomfortable. Bladder scan showed 900cc. Called [facility] and spoke with MD's nurse. Verbal order to insert 15F. Attempted insertion of 15F with resistance and no return of urine. Irrigated catheter and a large clot can out around the catheter. Called office back to make MD aware. Verbal order to insert 58F Coude. 58F Coude inserted smoothly and urine began to drain. Exactly 900cc of bloody urine drained into catheter bag. Will d/c patient home with catheter per MD verbal order.   Callie Fielding RN

## 2017-04-12 ENCOUNTER — Emergency Department (HOSPITAL_BASED_OUTPATIENT_CLINIC_OR_DEPARTMENT_OTHER): Payer: BLUE CROSS/BLUE SHIELD

## 2017-04-12 ENCOUNTER — Encounter (HOSPITAL_BASED_OUTPATIENT_CLINIC_OR_DEPARTMENT_OTHER): Payer: Self-pay | Admitting: Emergency Medicine

## 2017-04-12 ENCOUNTER — Emergency Department (HOSPITAL_BASED_OUTPATIENT_CLINIC_OR_DEPARTMENT_OTHER)
Admission: EM | Admit: 2017-04-12 | Discharge: 2017-04-12 | Disposition: A | Discharge status: Home / Self Care | Payer: BLUE CROSS/BLUE SHIELD | Attending: Emergency Medicine | Admitting: Emergency Medicine

## 2017-04-12 ENCOUNTER — Other Ambulatory Visit: Payer: Self-pay

## 2017-04-12 DIAGNOSIS — Z7982 Long term (current) use of aspirin: Secondary | ICD-10-CM | POA: Insufficient documentation

## 2017-04-12 DIAGNOSIS — M545 Low back pain: Secondary | ICD-10-CM | POA: Diagnosis present

## 2017-04-12 DIAGNOSIS — M5442 Lumbago with sciatica, left side: Secondary | ICD-10-CM | POA: Insufficient documentation

## 2017-04-12 DIAGNOSIS — Z7984 Long term (current) use of oral hypoglycemic drugs: Secondary | ICD-10-CM | POA: Diagnosis not present

## 2017-04-12 DIAGNOSIS — I10 Essential (primary) hypertension: Secondary | ICD-10-CM | POA: Diagnosis not present

## 2017-04-12 DIAGNOSIS — R202 Paresthesia of skin: Secondary | ICD-10-CM | POA: Diagnosis not present

## 2017-04-12 DIAGNOSIS — M79605 Pain in left leg: Secondary | ICD-10-CM | POA: Insufficient documentation

## 2017-04-12 DIAGNOSIS — E119 Type 2 diabetes mellitus without complications: Secondary | ICD-10-CM | POA: Diagnosis not present

## 2017-04-12 DIAGNOSIS — Z79899 Other long term (current) drug therapy: Secondary | ICD-10-CM | POA: Insufficient documentation

## 2017-04-12 MED ORDER — TRAMADOL HCL 50 MG PO TABS: 50.0000 mg | ORAL_TABLET | Freq: Four times a day (QID) | ORAL | 0 refills | Status: DC | PRN

## 2017-04-12 MED ORDER — LIDOCAINE 5 % EX PTCH: 1.0000 | MEDICATED_PATCH | CUTANEOUS | 0 refills | Status: DC

## 2017-04-12 MED ORDER — KETOROLAC TROMETHAMINE 15 MG/ML IJ SOLN: 15.0000 mg | Freq: Once | INTRAMUSCULAR | Status: DC

## 2017-04-12 MED ORDER — METHYLPREDNISOLONE 4 MG PO TBPK: ORAL_TABLET | ORAL | 0 refills | Status: DC

## 2017-04-12 MED ORDER — NAPROXEN 375 MG PO TABS: 375.0000 mg | ORAL_TABLET | Freq: Two times a day (BID) | ORAL | 0 refills | Status: DC

## 2017-04-12 MED ORDER — KETOROLAC TROMETHAMINE 30 MG/ML IJ SOLN
15.0000 mg | Freq: Once | INTRAMUSCULAR | Status: AC
  Administered 2017-04-12: 15 mg via INTRAMUSCULAR
  Filled 2017-04-12: qty 1

## 2017-04-12 MED FILL — traMADol HCL 50 MG TABS: 50 | 2 days supply | Qty: 10 | Fill #0

## 2017-04-12 MED FILL — NAPROXEN 375 MG TABS: 375 | 10 days supply | Qty: 20 | Fill #0

## 2017-04-12 MED FILL — LIDOCAINE PATCH 5%: 5 | 30 days supply | Qty: 30 | Fill #0

## 2017-04-12 MED FILL — METHYLPREDNISOLONE 4 MG TAB: 4 | 6 days supply | Qty: 21 | Fill #0

## 2017-04-12 NOTE — ED Provider Notes (Signed)
Medical screening examination/treatment/procedure(s) were conducted as a shared visit with non-physician practitioner(s) and myself.  I personally evaluated the patient during the encounter. Briefly, the patient is a 72 yo M here with left buttocks pain radiating down leg. Suspect sciatica with likely lumbar radiculopathy. Pain reproduced with leg raise, weight bearing on left. No falls or hip pain. Imaging c/w degenerative disease. No LE weakness, numbness on my exam, no signs of cauda equina or cord compression. No abd pain, no pulsatile masses or signs of AAA. Will tx supportively, refer to ortho.   EKG Interpretation None          Duffy Bruce, MD 04/12/17 1009

## 2017-04-12 NOTE — Discharge Instructions (Signed)
Your x-Javyn shows chronic changes.  Have given you several medications to start taking for your symptoms.  You may take the naproxen as prescribed however avoid any other NSAIDs including Advil, Aleve, Motrin.  You may also take Tylenol for pain around-the-clock.  Have given you short course of tramadol to take for pain that is not controlled by the naproxen or Tylenol.  This medication make you drowsy so do not drive or work with it.  Have also given you a steroid pack to help with inflammation.  This will make her blood sugars increase to keep a close eye on your blood sugars.  The lidocaine patches can be applied to the area of most discomfort.  Use as prescribed.  I would also soak in warm bath with Epsom salt.  Apply heating pad to the affected area.  Have given you a referral to orthopedics.  Return to the ED with any worsening symptoms including loss of bowel or bladder, able to urinate, fevers, chills, worsening pain or numbness/weakness, or for any other reason.

## 2017-04-12 NOTE — ED Provider Notes (Signed)
[facility] EMERGENCY DEPARTMENT Provider Note   CSN: [id number] Arrival date & time: 04/12/17  0920     History   Chief Complaint Chief Complaint  Patient presents with  . Back Pain    HPI David Sharp is a 72 y.o. male.  HPI 72 year old Caucasian male past medical history significant for BPH, GERD, hypertension, diabetes that presents to the emergency department today with complaints of left lower back pain that radiates to his left leg.  The patient states that his symptoms have been ongoing for 1 week.  He states that certain positions when he sits, prolonged standing makes the pain worse.  He states that it is a dull ache with intermittent intensity with certain position changes.  Patient reports some intermittent paresthesias as well.  Denies any associated edema or calf tenderness.  Patient denies any known injury.  His wife who has sciatica thinks that he has a sciatic nerve pain.  Patient has been taking naproxen and oxycodone with only little relief.  Patient has not had to use a walker over the past few days due to the pain however he is normally ambulatory without a walker.  Patient denies any associated urinary symptoms. Pt denies any ha, night sweats, hx of ivdu/cancer, loss or bowel or bladder, urinary retention, saddle paresthesias, lower extremity paresthesias.  Denies any associated abdominal pain, chest pain, shortness of breath. Pt states he took one naproxen earlier this morning.  Past Medical History:  Diagnosis Date  . Acute urinary retention   . Arthritis   . BPH (benign prostatic hyperplasia)   . Foley catheter in place   . GERD (gastroesophageal reflux disease)   . Hyperlipidemia   . Hypertension   . Type 2 diabetes mellitus ([place])   . Wears glasses     Patient Active Problem List   Diagnosis Date Noted  . BPH with urinary obstruction 01/27/2016    Past Surgical History:  Procedure Laterality Date  . CATARACT EXTRACTION W/ INTRAOCULAR LENS  IMPLANT Right 1990's  . INGUINAL HERNIA REPAIR Right 2007  approx  . RETINAL DETACHMENT SURGERY Bilateral last one , left eye 1997/  right eye 1990's  . TRANSURETHRAL RESECTION OF PROSTATE N/A 01/27/2016   Procedure: TRANSURETHRAL RESECTION OF THE PROSTATE (TURP) WITH GYRUS;  Surgeon: Kathie Rhodes, MD;  Location: [facility];  Service: Urology;  Laterality: N/A;       Home Medications    Prior to Admission medications   Medication Sig Start Date End Date Taking? Authorizing Provider  amLODipine (NORVASC) 5 MG tablet Take 5 mg by mouth every morning. for blood pressure 12/04/15  Yes [provider]  aspirin EC 81 MG tablet Take 81 mg by mouth daily.   Yes [provider]  atorvastatin (LIPITOR) 10 MG tablet Take 10 mg by mouth every evening.  12/04/15  Yes [provider]  glyBURIDE-metformin (GLUCOVANCE) 2.5-500 MG tablet Take 1 tablet by mouth 2 (two) times daily. 11/18/15  Yes [provider]  lisinopril (PRINIVIL,ZESTRIL) 20 MG tablet Take 20 mg by mouth every morning.  11/12/15  Yes [provider]  ciprofloxacin (CIPRO) 500 MG tablet Take 500 mg by mouth 2 (two) times daily.    [provider]  ciprofloxacin (CIPRO) 500 MG tablet Take 1 tablet (500 mg total) by mouth 2 (two) times daily. 01/27/16   Kathie Rhodes, MD  HYDROcodone-acetaminophen (NORCO) 10-325 MG tablet Take 1-2 tablets by mouth every 4 (four) hours as needed for moderate  pain. Maximum dose per 24 hours -8 pills. 01/27/16   Kathie Rhodes, MD  Multiple Vitamin (MULTIVITAMIN) tablet Take 1 tablet by mouth daily.    [provider]  Omega-3 Fatty Acids (FISH OIL) 1200 MG CAPS Take 1-2 capsules by mouth 2 (two) times daily.     [provider]  phenazopyridine (PYRIDIUM) 200 MG tablet Take 1 tablet (200 mg total) by mouth 3 (three) times daily as needed for pain. 01/27/16   Kathie Rhodes, MD  pioglitazone (ACTOS) 45 MG tablet Take 45 mg by mouth  every evening.  12/04/15   [provider]  RAPAFLO 8 MG CAPS capsule Take 8 mg by mouth daily. 01/06/16   [provider]  sulfamethoxazole-trimethoprim (BACTRIM DS,SEPTRA DS) 800-160 MG tablet Take 1 tablet by mouth 2 (two) times daily. 01/14/16   [provider]    Family History No family history on file.  Social History Social History   Tobacco Use  . Smoking status: Never Smoker  . Smokeless tobacco: Never Used  Substance Use Topics  . Alcohol use: Yes    Comment: VERY RARE  . Drug use: No     Allergies   Amoxicillin   Review of Systems Review of Systems  Constitutional: Negative for chills and fever.  Respiratory: Negative for shortness of breath.   Cardiovascular: Negative for chest pain and leg swelling.  Gastrointestinal: Negative for abdominal pain, diarrhea, nausea and vomiting.  Genitourinary: Negative for dysuria, frequency, hematuria and urgency.  Musculoskeletal: Positive for arthralgias, back pain, gait problem and myalgias. Negative for neck pain and neck stiffness.  Skin: Negative for color change and rash.  Neurological: Positive for numbness. Negative for weakness.     Physical Exam Updated Vital Signs BP (!) 162/80 (BP Location: Right Arm)   Pulse 90   Temp 98.3 F (36.8 C) (Oral)   Resp 18   Ht 6\' 2"  (1.88 m)   Wt 122.5 kg (270 lb)   SpO2 99%   BMI 34.67 kg/m   Physical Exam  Constitutional: He appears well-developed and well-nourished. No distress.  HENT:  Head: Normocephalic and atraumatic.  Eyes: Right eye exhibits no discharge. Left eye exhibits no discharge. No scleral icterus.  Neck: Normal range of motion.  Cardiovascular: Intact distal pulses.  Pulmonary/Chest: No respiratory distress.  Abdominal: Soft. Bowel sounds are normal. He exhibits no pulsatile midline mass. There is no tenderness. There is no rebound and no guarding.  No CVA tenderness.  Musculoskeletal: Normal range of motion.  DP pulses are  2+ bilaterally.  Brisk cap refill.  Sensation intact.  Full range of motion.  No joints with erythema or warmth.  Patient does have some reproducible pain and paresthesias with straight leg raise test of the left leg.  Neurological: He is alert.  Strength 5 out of 5 in lower extremities with flexion extension of the bilateral hips and flexion extensions of the bilateral knees.  Sensation is intact in all dermatomes.  Patellar reflexes are normal.  Skin: Skin is warm and dry. Capillary refill takes less than 2 seconds. No rash noted. No pallor.  Varicose veins noted to the lower extremities.  No associated edema or erythema of the lower extremity with no associated calf tenderness.  No rashes noted.  Psychiatric: His behavior is normal. Judgment and thought content normal.  Nursing note and vitals reviewed.    ED Treatments / Results  Labs (all labs ordered are listed, but only abnormal results are displayed) Labs Reviewed -  No data to display  EKG  EKG Interpretation None       Radiology No results found.  Procedures Procedures (including critical care time)  Medications Ordered in ED Medications - No data to display   Initial Impression / Assessment and Plan / ED Course  I have reviewed the triage vital signs and the nursing notes.  Pertinent labs & imaging results that were available during my care of the patient were reviewed by me and considered in my medical decision making (see chart for details).     Patient with back pain that started 1 week ago.  Describes it as left low back pain that radiates down his left buttocks into his left leg.  Denies any known injuries.  X-Skipper shows degenerative changes but no acute findings.  No neurological deficits and normal neuro exam.  Patient can walk but states is painful. No abd pain, or pulsatile masses that would be concerning for AAA.  No loss of bowel or bladder control.  No concern for cauda equina.  No fever, night sweats,  weight loss, h/o cancer, IVDU.  Physical exam is reassuring.  Patient denies any associated abdominal pain, chest pain, fevers.  She is symptoms seem consistent with radicular pain from possible herniated disc versus sciatic nerve pain.  We will treat this symptomatically with NSAIDs, tramadol, lidocaine patches, steroid pack.  RICE protocol and pain medicine indicated and discussed with patient. .  Pt seen and eval by my attending who is agreeable with the above plan.   Final Clinical Impressions(s) / ED Diagnoses   Final diagnoses:  Acute left-sided low back pain with left-sided sciatica    ED Discharge Orders        Ordered    naproxen (NAPROSYN) 375 MG tablet  2 times daily     04/12/17 1012    traMADol (ULTRAM) 50 MG tablet  Every 6 hours PRN     04/12/17 1012    methylPREDNISolone (MEDROL DOSEPAK) 4 MG TBPK tablet     04/12/17 1012    lidocaine (LIDODERM) 5 %  Every 24 hours     04/12/17 1012       Doristine Devoid, PA-C 04/12/17 1021    Duffy Bruce, MD 04/13/17 [phone]

## 2017-04-12 NOTE — ED Triage Notes (Signed)
L lower back pain radiating down leg x 1 week. Denies injury.

## 2017-04-12 NOTE — ED Notes (Signed)
Patient transported to [facility] 

## 2020-01-18 ENCOUNTER — Telehealth (HOSPITAL_COMMUNITY): Payer: Self-pay

## 2020-01-18 ENCOUNTER — Ambulatory Visit (HOSPITAL_COMMUNITY)
Admission: RE | Admit: 2020-01-18 | Discharge: 2020-01-18 | Disposition: A | Discharge status: Home / Self Care | Payer: PPO | Source: Ambulatory Visit | Attending: Pulmonary Disease | Admitting: Pulmonary Disease

## 2020-01-18 ENCOUNTER — Other Ambulatory Visit: Payer: Self-pay | Admitting: Physician Assistant

## 2020-01-18 DIAGNOSIS — N138 Other obstructive and reflux uropathy: Secondary | ICD-10-CM | POA: Insufficient documentation

## 2020-01-18 DIAGNOSIS — I10 Essential (primary) hypertension: Secondary | ICD-10-CM | POA: Insufficient documentation

## 2020-01-18 DIAGNOSIS — E119 Type 2 diabetes mellitus without complications: Secondary | ICD-10-CM

## 2020-01-18 DIAGNOSIS — Z23 Encounter for immunization: Secondary | ICD-10-CM | POA: Insufficient documentation

## 2020-01-18 DIAGNOSIS — U071 COVID-19: Secondary | ICD-10-CM | POA: Insufficient documentation

## 2020-01-18 DIAGNOSIS — N401 Enlarged prostate with lower urinary tract symptoms: Secondary | ICD-10-CM | POA: Insufficient documentation

## 2020-01-18 MED ORDER — ALBUTEROL SULFATE HFA 108 (90 BASE) MCG/ACT IN AERS: 2.0000 | INHALATION_SPRAY | Freq: Once | RESPIRATORY_TRACT | Status: DC | PRN

## 2020-01-18 MED ORDER — EPINEPHRINE 0.3 MG/0.3ML IJ SOAJ: 0.3000 mg | Freq: Once | INTRAMUSCULAR | Status: DC | PRN

## 2020-01-18 MED ORDER — SODIUM CHLORIDE 0.9 % IV SOLN: INTRAVENOUS | Status: DC | PRN

## 2020-01-18 MED ORDER — METHYLPREDNISOLONE SODIUM SUCC 125 MG IJ SOLR: 125.0000 mg | Freq: Once | INTRAMUSCULAR | Status: DC | PRN

## 2020-01-18 MED ORDER — FAMOTIDINE IN NACL 20-0.9 MG/50ML-% IV SOLN: 20.0000 mg | Freq: Once | INTRAVENOUS | Status: DC | PRN

## 2020-01-18 MED ORDER — DIPHENHYDRAMINE HCL 50 MG/ML IJ SOLN: 50.0000 mg | Freq: Once | INTRAMUSCULAR | Status: DC | PRN

## 2020-01-18 MED ORDER — SODIUM CHLORIDE 0.9 % IV SOLN: Freq: Once | INTRAVENOUS | Status: AC

## 2020-01-18 NOTE — Telephone Encounter (Signed)
Called to Discuss with patient about Covid symptoms and the use of the monoclonal antibody infusion for those with mild to moderate Covid symptoms and at a high risk of hospitalization.     Pt appears to qualify for this infusion due to co-morbid conditions and/or a member of an at-risk group in accordance with the FDA Emergency Use Authorization.   Referred to APP to further screen and/or schedule. Pt did not have a COVID test, wife has positive test and pt has symptoms.

## 2020-01-18 NOTE — Discharge Instructions (Signed)

## 2020-01-18 NOTE — Progress Notes (Signed)
  Diagnosis: COVID-19  Physician: Dr. Wright  Procedure: Covid Infusion Clinic Med: bamlanivimab\etesevimab infusion - Provided patient with bamlanimivab\etesevimab fact sheet for patients, parents and caregivers prior to infusion.  Complications: No immediate complications noted.  Discharge: Discharged home   Cormick Moss J Victormanuel Mclure 01/18/2020  

## 2020-01-18 NOTE — Progress Notes (Signed)
I connected by phone with David Sharp on 01/18/2020 at 9:04 AM to discuss the potential use of a new treatment for mild to moderate COVID-19 viral infection in non-hospitalized patients.  This patient is a 74 y.o. male that meets the FDA criteria for Emergency Use Authorization of COVID monoclonal antibody casirivimab/imdevimab or bamlamivimab/estevimab.  Has a (+) direct SARS-CoV-2 viral test result  Has mild or moderate COVID-19   Is NOT hospitalized due to COVID-19  Is within 10 days of symptom onset  Has at least one of the high risk factor(s) for progression to severe COVID-19 and/or hospitalization as defined in EUA.  Specific high risk criteria : Older age (>/= 72 yo), BMI > 25, Diabetes and Cardiovascular disease or hypertension   I have spoken and communicated the following to the patient or parent/caregiver regarding COVID monoclonal antibody treatment:  1. FDA has authorized the emergency use for the treatment of mild to moderate COVID-19 in adults and pediatric patients with positive results of direct SARS-CoV-2 viral testing who are 31 years of age and older weighing at least 40 kg, and who are at high risk for progressing to severe COVID-19 and/or hospitalization.  2. The significant known and potential risks and benefits of COVID monoclonal antibody, and the extent to which such potential risks and benefits are unknown.  3. Information on available alternative treatments and the risks and benefits of those alternatives, including clinical trials.  4. Patients treated with COVID monoclonal antibody should continue to self-isolate and use infection control measures (e.g., wear mask, isolate, social distance, avoid sharing personal items, clean and disinfect "high touch" surfaces, and frequent handwashing) according to CDC guidelines.   5. The patient or parent/caregiver has the option to accept or refuse COVID monoclonal antibody treatment.  After reviewing this information  with the patient, the patient has agreed to receive one of the available covid 19 monoclonal antibodies and will be provided an appropriate fact sheet prior to infusion.  Sx onset 10/18. Set up for infusion on 10/21 @ 12:30pm. Directions given to [facility]. Pt is aware that insurance will be charged an infusion fee. Pt is fully vaccinated.   [name] 01/18/2020 9:04 AM

## 2020-01-31 ENCOUNTER — Encounter (HOSPITAL_COMMUNITY): Payer: Self-pay

## 2020-01-31 ENCOUNTER — Other Ambulatory Visit: Payer: Self-pay

## 2020-01-31 ENCOUNTER — Inpatient Hospital Stay (HOSPITAL_COMMUNITY)
Admission: EM | Admit: 2020-01-31 | Discharge: 2020-02-03 | DRG: 637 | Disposition: A | Discharge status: Home / Self Care | Payer: PPO | Attending: Student | Admitting: Student

## 2020-01-31 ENCOUNTER — Emergency Department (HOSPITAL_COMMUNITY): Payer: PPO

## 2020-01-31 DIAGNOSIS — E1100 Type 2 diabetes mellitus with hyperosmolarity without nonketotic hyperglycemic-hyperosmolar coma (NKHHC): Secondary | ICD-10-CM | POA: Diagnosis present

## 2020-01-31 DIAGNOSIS — I480 Paroxysmal atrial fibrillation: Secondary | ICD-10-CM | POA: Diagnosis not present

## 2020-01-31 DIAGNOSIS — Z79899 Other long term (current) drug therapy: Secondary | ICD-10-CM | POA: Diagnosis not present

## 2020-01-31 DIAGNOSIS — M1990 Unspecified osteoarthritis, unspecified site: Secondary | ICD-10-CM | POA: Diagnosis not present

## 2020-01-31 DIAGNOSIS — Z881 Allergy status to other antibiotic agents status: Secondary | ICD-10-CM

## 2020-01-31 DIAGNOSIS — E1165 Type 2 diabetes mellitus with hyperglycemia: Secondary | ICD-10-CM | POA: Diagnosis not present

## 2020-01-31 DIAGNOSIS — N179 Acute kidney failure, unspecified: Secondary | ICD-10-CM | POA: Diagnosis present

## 2020-01-31 DIAGNOSIS — E875 Hyperkalemia: Secondary | ICD-10-CM | POA: Diagnosis not present

## 2020-01-31 DIAGNOSIS — E871 Hypo-osmolality and hyponatremia: Secondary | ICD-10-CM | POA: Diagnosis present

## 2020-01-31 DIAGNOSIS — R739 Hyperglycemia, unspecified: Secondary | ICD-10-CM | POA: Diagnosis not present

## 2020-01-31 DIAGNOSIS — E785 Hyperlipidemia, unspecified: Secondary | ICD-10-CM | POA: Diagnosis present

## 2020-01-31 DIAGNOSIS — K219 Gastro-esophageal reflux disease without esophagitis: Secondary | ICD-10-CM | POA: Diagnosis present

## 2020-01-31 DIAGNOSIS — R32 Unspecified urinary incontinence: Secondary | ICD-10-CM | POA: Diagnosis not present

## 2020-01-31 DIAGNOSIS — I10 Essential (primary) hypertension: Secondary | ICD-10-CM | POA: Diagnosis not present

## 2020-01-31 DIAGNOSIS — R350 Frequency of micturition: Secondary | ICD-10-CM | POA: Diagnosis not present

## 2020-01-31 DIAGNOSIS — R531 Weakness: Secondary | ICD-10-CM | POA: Diagnosis not present

## 2020-01-31 DIAGNOSIS — J9811 Atelectasis: Secondary | ICD-10-CM | POA: Diagnosis not present

## 2020-01-31 DIAGNOSIS — R7989 Other specified abnormal findings of blood chemistry: Secondary | ICD-10-CM | POA: Diagnosis not present

## 2020-01-31 DIAGNOSIS — Z7982 Long term (current) use of aspirin: Secondary | ICD-10-CM | POA: Diagnosis not present

## 2020-01-31 DIAGNOSIS — Z8249 Family history of ischemic heart disease and other diseases of the circulatory system: Secondary | ICD-10-CM | POA: Diagnosis not present

## 2020-01-31 DIAGNOSIS — N281 Cyst of kidney, acquired: Secondary | ICD-10-CM | POA: Diagnosis not present

## 2020-01-31 DIAGNOSIS — E861 Hypovolemia: Secondary | ICD-10-CM | POA: Diagnosis not present

## 2020-01-31 DIAGNOSIS — U071 COVID-19: Secondary | ICD-10-CM | POA: Diagnosis not present

## 2020-01-31 DIAGNOSIS — Z9841 Cataract extraction status, right eye: Secondary | ICD-10-CM | POA: Diagnosis not present

## 2020-01-31 DIAGNOSIS — I4891 Unspecified atrial fibrillation: Secondary | ICD-10-CM | POA: Diagnosis not present

## 2020-01-31 DIAGNOSIS — E118 Type 2 diabetes mellitus with unspecified complications: Secondary | ICD-10-CM | POA: Diagnosis not present

## 2020-01-31 DIAGNOSIS — R112 Nausea with vomiting, unspecified: Secondary | ICD-10-CM

## 2020-01-31 DIAGNOSIS — Z961 Presence of intraocular lens: Secondary | ICD-10-CM | POA: Diagnosis not present

## 2020-01-31 DIAGNOSIS — N400 Enlarged prostate without lower urinary tract symptoms: Secondary | ICD-10-CM | POA: Diagnosis present

## 2020-01-31 DIAGNOSIS — R0981 Nasal congestion: Secondary | ICD-10-CM | POA: Diagnosis not present

## 2020-01-31 DIAGNOSIS — E860 Dehydration: Secondary | ICD-10-CM

## 2020-01-31 DIAGNOSIS — I674 Hypertensive encephalopathy: Secondary | ICD-10-CM | POA: Diagnosis not present

## 2020-01-31 LAB — URINALYSIS, ROUTINE W REFLEX MICROSCOPIC
Bacteria, UA: NONE SEEN
Bilirubin Urine: NEGATIVE
Glucose, UA: >=500 mg/dL — AB
Ketones, ur: NEGATIVE mg/dL
Leukocytes,Ua: NEGATIVE
Nitrite: NEGATIVE
Protein, ur: NEGATIVE mg/dL
Specific Gravity, Urine: 1.025 (ref 1.005–1.030)
pH: 5 (ref 5.0–8.0)

## 2020-01-31 LAB — RESPIRATORY PANEL BY RT PCR (FLU A&B, COVID)
Influenza A by PCR: NEGATIVE
Influenza B by PCR: NEGATIVE
SARS Coronavirus 2 by RT PCR: POSITIVE — AB

## 2020-01-31 LAB — BLOOD GAS, VENOUS
Acid-base deficit: 3.5 mmol/L — ABNORMAL HIGH (ref 0.0–2.0)
Bicarbonate: 21.5 mmol/L (ref 20.0–28.0)
O2 Saturation: 54.4 %
Patient temperature: 98.6
pCO2, Ven: 40.7 mmHg — ABNORMAL LOW (ref 44.0–60.0)
pH, Ven: 7.343 (ref 7.250–7.430)
pO2, Ven: 30 mmHg — CL (ref 32.0–45.0)

## 2020-01-31 LAB — CBG MONITORING, ED
Glucose-Capillary: 578 mg/dL (ref 70–99)
Glucose-Capillary: 594 mg/dL (ref 70–99)
Glucose-Capillary: 538 mg/dL (ref 70–99)
Glucose-Capillary: 438 mg/dL — ABNORMAL HIGH (ref 70–99)
Glucose-Capillary: 411 mg/dL — ABNORMAL HIGH (ref 70–99)

## 2020-01-31 LAB — BETA-HYDROXYBUTYRIC ACID: Beta-Hydroxybutyric Acid: 0.14 mmol/L (ref 0.05–0.27)

## 2020-01-31 LAB — BASIC METABOLIC PANEL
Anion gap: 13 (ref 5–15)
BUN: 53 mg/dL — ABNORMAL HIGH (ref 8–23)
CO2: 20 mmol/L — ABNORMAL LOW (ref 22–32)
Calcium: 9.9 mg/dL (ref 8.9–10.3)
Chloride: 101 mmol/L (ref 98–111)
Creatinine, Ser: 1.79 mg/dL — ABNORMAL HIGH (ref 0.61–1.24)
GFR, Estimated: 40 mL/min — ABNORMAL LOW (ref >60)
Glucose, Bld: 543 mg/dL (ref 70–99)
Potassium: 5.6 mmol/L — ABNORMAL HIGH (ref 3.5–5.1)
Sodium: 134 mmol/L — ABNORMAL LOW (ref 135–145)

## 2020-01-31 LAB — CBC
HCT: 41.3 % (ref 39.0–52.0)
Hemoglobin: 14.1 g/dL (ref 13.0–17.0)
MCH: 31.2 pg (ref 26.0–34.0)
MCHC: 34.1 g/dL (ref 30.0–36.0)
MCV: 91.4 fL (ref 80.0–100.0)
Platelets: 287 10*3/uL (ref 150–400)
RBC: 4.52 MIL/uL (ref 4.22–5.81)
RDW: 13.1 % (ref 11.5–15.5)
WBC: 22.5 10*3/uL — ABNORMAL HIGH (ref 4.0–10.5)
nRBC: 0 % (ref 0.0–0.2)

## 2020-01-31 MED ORDER — ONDANSETRON HCL 4 MG/2ML IJ SOLN: 4.0000 mg | Freq: Four times a day (QID) | INTRAMUSCULAR | Status: DC | PRN

## 2020-01-31 MED ORDER — ATORVASTATIN CALCIUM 10 MG PO TABS
10.0000 mg | ORAL_TABLET | Freq: Every evening | ORAL | Status: DC
  Administered 2020-02-01 – 2020-02-02 (×2): 10 mg via ORAL
  Filled 2020-01-31 (×2): qty 1

## 2020-01-31 MED ORDER — ACETAMINOPHEN 650 MG RE SUPP: 650.0000 mg | Freq: Four times a day (QID) | RECTAL | Status: DC | PRN

## 2020-01-31 MED ORDER — ACETAMINOPHEN 325 MG PO TABS
650.0000 mg | ORAL_TABLET | Freq: Four times a day (QID) | ORAL | Status: DC | PRN
  Administered 2020-01-31 – 2020-02-03 (×2): 650 mg via ORAL
  Filled 2020-01-31 (×2): qty 2

## 2020-01-31 MED ORDER — DEXTROSE 50 % IV SOLN: 0.0000 mL | INTRAVENOUS | Status: DC | PRN

## 2020-01-31 MED ORDER — INSULIN REGULAR(HUMAN) IN NACL 100-0.9 UT/100ML-% IV SOLN
INTRAVENOUS | Status: DC
  Administered 2020-01-31: 13 [IU]/h via INTRAVENOUS
  Filled 2020-01-31: qty 100

## 2020-01-31 MED ORDER — ASPIRIN EC 81 MG PO TBEC
81.0000 mg | DELAYED_RELEASE_TABLET | Freq: Every day | ORAL | Status: DC
  Administered 2020-02-01 – 2020-02-02 (×2): 81 mg via ORAL
  Filled 2020-01-31 (×2): qty 1

## 2020-01-31 MED ORDER — LACTATED RINGERS IV SOLN: INTRAVENOUS | Status: DC

## 2020-01-31 MED ORDER — AMLODIPINE BESYLATE 5 MG PO TABS: 5.0000 mg | ORAL_TABLET | Freq: Every morning | ORAL | Status: DC

## 2020-01-31 MED ORDER — HEPARIN SODIUM (PORCINE) 5000 UNIT/ML IJ SOLN
5000.0000 [IU] | Freq: Three times a day (TID) | INTRAMUSCULAR | Status: DC
  Administered 2020-02-01 (×2): 5000 [IU] via SUBCUTANEOUS
  Filled 2020-01-31 (×2): qty 1

## 2020-01-31 MED ORDER — DEXTROSE IN LACTATED RINGERS 5 % IV SOLN: INTRAVENOUS | Status: DC

## 2020-01-31 MED ORDER — ONDANSETRON HCL 4 MG PO TABS: 4.0000 mg | ORAL_TABLET | Freq: Four times a day (QID) | ORAL | Status: DC | PRN

## 2020-01-31 MED ORDER — ONDANSETRON HCL 4 MG/2ML IJ SOLN
4.0000 mg | Freq: Once | INTRAMUSCULAR | Status: AC
  Administered 2020-01-31: 4 mg via INTRAVENOUS
  Filled 2020-01-31: qty 2

## 2020-01-31 MED ORDER — LACTATED RINGERS IV BOLUS
1000.0000 mL | INTRAVENOUS | Status: AC
  Administered 2020-01-31: 1000 mL via INTRAVENOUS

## 2020-01-31 NOTE — H&P (Signed)
[facility] History and Physical  AYAZ SONDGEROTH SWN:[id number] DOB: [birth year] DOA: 01/31/2020   PCP: Dr. Daryel Gerald with [facility] Specialists: None  Chief Complaint: Elevated glucose levels  HPI: David Sharp is a 74 y.o. male with a past medical history of diabetes mellitus type 2, essential hypertension who was diagnosed with COVID-19 on 01/17/20.  Due to his risk factors he was offered monoclonal antibody treatment on 10/21.  He received an infusion on that day.  He was being followed by the hospital at home nursing staff.  Apparently his oral intake has been poor.  His appetite was very poor as well.  He has had some nausea and vomiting.  No diarrhea.  Denies any abdominal pain.  No chest pain.  Does not have any respiratory symptoms at all.  He was given a prescription for azithromycin.  Does not recall being on steroids.  At home his glucose levels have been elevated.  Despite getting insulin home health nurses were not able to control it.  So he was sent over to the ED for further evaluation.  In the emergency department he was found to have a potassium of 5.6, sodium of 134 glucose of 543.  Anion gap was normal.  Elevated WBC was noted.  Patient was started on insulin infusion and hospitalized for further management.  Home Medications: Prior to Admission medications   Medication Sig Start Date End Date Taking? Authorizing Provider  acetaminophen (TYLENOL) 500 MG tablet Take 1,000 mg by mouth every 6 (six) hours as needed for moderate pain.   Yes [provider]  amLODipine (NORVASC) 5 MG tablet Take 5 mg by mouth every morning.  12/04/15  Yes [provider]  Ascorbic Acid (VITAMIN C) 1000 MG tablet Take 1,000 mg by mouth daily.   Yes [provider]  aspirin EC 81 MG tablet Take 81 mg by mouth daily.   Yes [provider]  atorvastatin (LIPITOR) 10 MG tablet Take 10 mg by mouth every evening.  12/04/15  Yes [provider]    Cholecalciferol (VITAMIN D) 125 MCG (5000 UT) CAPS Take 5,000 Units by mouth daily.   Yes [provider]  glyBURIDE-metformin (GLUCOVANCE) 2.5-500 MG tablet Take 1 tablet by mouth 2 (two) times daily. 11/18/15  Yes [provider]  lisinopril (ZESTRIL) 40 MG tablet Take 40 mg by mouth daily.   Yes [provider]  Multiple Vitamin (MULTIVITAMIN) tablet Take 1 tablet by mouth daily.   Yes [provider]  Omega-3 Fatty Acids (FISH OIL) 1200 MG CAPS Take 1 capsule by mouth in the morning, at noon, and at bedtime.    Yes [provider]  zinc gluconate 50 MG tablet Take 50 mg by mouth daily.   Yes [provider]  ciprofloxacin (CIPRO) 500 MG tablet Take 500 mg by mouth 2 (two) times daily. Patient not taking: Reported on 01/31/2020    [provider]  ciprofloxacin (CIPRO) 500 MG tablet Take 1 tablet (500 mg total) by mouth 2 (two) times daily. Patient not taking: Reported on 01/31/2020 01/27/16   Kathie Rhodes, MD  HYDROcodone-acetaminophen [facility]) 10-325 MG tablet Take 1-2 tablets by mouth every 4 (four) hours as needed for moderate pain. Maximum dose per 24 hours -8 pills. Patient not taking: Reported on 01/31/2020 01/27/16   Kathie Rhodes, MD  lidocaine (LIDODERM) 5 % Place 1 patch onto the skin daily. Remove & Discard patch within 12 hours or as directed by MD Patient not taking:  Reported on 01/31/2020 04/12/17   Ocie Cornfield T, PA-C  methylPREDNISolone (MEDROL DOSEPAK) 4 MG TBPK tablet Take as prescribed Patient not taking: Reported on 01/31/2020 04/12/17   Ocie Cornfield T, PA-C  naproxen (NAPROSYN) 375 MG tablet Take 1 tablet (375 mg total) by mouth 2 (two) times daily. Patient not taking: Reported on 01/31/2020 04/12/17   Ocie Cornfield T, PA-C  phenazopyridine (PYRIDIUM) 200 MG tablet Take 1 tablet (200 mg total) by mouth 3 (three) times daily as needed for pain. Patient not taking: Reported on 01/31/2020 01/27/16   Kathie Rhodes, MD  traMADol (ULTRAM) 50 MG tablet Take 1 tablet (50 mg total) by mouth every 6 (six) hours as needed. Patient not taking: Reported on 01/31/2020 04/12/17   Doristine Devoid, PA-C    Allergies:  Allergies  Allergen Reactions  . Amoxicillin Hives    Past Medical History: Past Medical History:  Diagnosis Date  . Acute urinary retention   . Arthritis   . BPH (benign prostatic hyperplasia)   . Foley catheter in place   . GERD (gastroesophageal reflux disease)   . Hyperlipidemia   . Hypertension   . Type 2 diabetes mellitus ([place])   . Wears glasses     Past Surgical History:  Procedure Laterality Date  . CATARACT EXTRACTION W/ INTRAOCULAR LENS IMPLANT Right 1990's  . INGUINAL HERNIA REPAIR Right 2007  approx  . RETINAL DETACHMENT SURGERY Bilateral last one , left eye 1997/  right eye 1990's  . TRANSURETHRAL RESECTION OF PROSTATE N/A 01/27/2016   Procedure: TRANSURETHRAL RESECTION OF THE PROSTATE (TURP) WITH GYRUS;  Surgeon: Kathie Rhodes, MD;  Location: [facility];  Service: Urology;  Laterality: N/A;    Social History: Lives with his wife.  No history of smoking alcohol use.   Family History: History of high blood pressure in the family  Review of Systems - History obtained from the patient General ROS: positive for  - fatigue Psychological ROS: negative Ophthalmic ROS: negative ENT ROS: negative Allergy and Immunology ROS: negative Hematological and Lymphatic ROS: negative Endocrine ROS: negative Respiratory ROS: no cough, shortness of breath, or wheezing Cardiovascular ROS: no chest pain or dyspnea on exertion Gastrointestinal ROS: As in HPI Genito-Urinary ROS: no dysuria, trouble voiding, or hematuria Musculoskeletal ROS: negative Neurological ROS: no TIA or stroke symptoms Dermatological ROS: negative  Physical Examination  Vitals:   01/31/20 2130 01/31/20 2200 01/31/20 2306 01/31/20 2330  BP: (!) 161/71 (!) 151/69 (!) 149/67 138/64    Pulse: 72 68 75 69  Resp: 18 17 18 17   Temp:      TempSrc:      SpO2: 96% 98% 97% 96%  Weight:      Height:        BP 138/64   Pulse 69   Temp 97.9 F (36.6 C) (Oral)   Resp 17   Ht 6\' 1"  (1.854 m)   Wt 113.4 kg   SpO2 96%   BMI 32.98 kg/m   General appearance: alert, cooperative, appears stated age and no distress Head: Normocephalic, without obvious abnormality, atraumatic Eyes: conjunctivae/corneas clear. PERRL, EOM's intact.  Throat: lips, mucosa, and tongue normal; teeth and gums normal Neck: no adenopathy, no carotid bruit, no JVD, supple, symmetrical, trachea midline and thyroid not enlarged, symmetric, no tenderness/mass/nodules Resp: clear to auscultation bilaterally Cardio: regular rate and rhythm, S1, S2 normal, no murmur, click, rub or gallop GI: soft, non-tender; bowel sounds normal; no masses,  no organomegaly Extremities: Mild edema bilateral  lower extremities Pulses: 2+ and symmetric Skin: Skin color, texture, turgor normal. No rashes or lesions Lymph nodes: Cervical, supraclavicular, and axillary nodes normal. Neurologic: Alert and oriented x3.  Slow to respond at times.  No obvious focal deficits noted.   Labs on Admission: I have personally reviewed following labs and imaging studies  CBC: Recent Labs  Lab 01/31/20 1818  WBC 22.5*  HGB 14.1  HCT 41.3  MCV 91.4  PLT 656   Basic Metabolic Panel: Recent Labs  Lab 01/31/20 1818  NA 134*  K 5.6*  CL 101  CO2 20*  GLUCOSE 543*  BUN 53*  CREATININE 1.79*  CALCIUM 9.9   GFR: Estimated Creatinine Clearance: 49.2 mL/min (A) (by C-G formula based on SCr of 1.79 mg/dL (H)).  CBG: Recent Labs  Lab 01/31/20 1824 01/31/20 2025 01/31/20 2223 01/31/20 2302 01/31/20 2356  GLUCAP 578* 538* 438* 411* 309*     Radiological Exams on Admission: DG Chest 2 View  Result Date: 01/31/2020 CLINICAL DATA:  Weakness EXAM: CHEST - 2 VIEW COMPARISON:  None. FINDINGS: Cardiac shadows within normal  limits. Patchy atelectatic changes are noted in the bases bilaterally. No focal confluent infiltrate is seen. No sizable effusion is noted. No acute bony abnormality is seen. IMPRESSION: Patchy bibasilar atelectasis. Electronically Signed   By: Inez Catalina M.D.   On: 01/31/2020 18:59    My interpretation of Electrocardiogram: Sinus rhythm.  76 bpm.  Normal axis.  Intervals are normal.  No concerning ST or T wave changes.   Problem List  Active Problems:   Hyperglycemia   ARF (acute renal failure) (HCC)   Hyponatremia   Hyperkalemia   Essential hypertension   COVID-19 virus infection   Assessment: This is a 74 year old male with a past medical history as stated earlier who was diagnosed with COVID-19 on October 28.  Received monoclonal antibodies on October 21.  Has had poor oral intake.  Has been followed by a hospital at home nursing staff.  Has been noted to have high glucose levels.  Apparently has been having sugary drinks at home.  Came in with hyperglycemia.  Plan:  1. Hyperglycemia in the setting of diabetes mellitus type 2: He has been placed on a insulin infusion.  Continue to monitor CBGs.  No evidence for diabetic ketoacidosis.  He takes Metformin and glyburide at home.  Does not take insulin at home.  Check HbA1c.  HbA1c was 9.2 in June.  2. COVID-19 infection: He has completed his 10-day isolation.  He had mild symptoms.  He did not have any respiratory symptoms at all.  He received monoclonal antibody treatment on October 21.  Currently does not have any respiratory symptoms.  Chest x-Happy only showed atelectasis.  Continue to monitor for now.  No need for isolation currently.  3. Acute renal failure/hyponatremia/hyperkalemia: This is all likely due to poor oral intake in the setting of ACE inhibitor use at home.  He is being hydrated.  We will recheck labs tomorrow morning.  4.  Leukocytosis: Noted to be afebrile.  No obvious source of infection has been found.  Denies  diarrhea.  Likely reactive.  Recheck tomorrow morning.  5. Nausea and vomiting: Reason for this is unclear.  His abdomen is benign.  Continue to monitor.  6. Essential hypertension: Hold his ACE inhibitor.  Continue amlodipine.  Monitor blood pressures closely.  7.  History of BPH  DVT Prophylaxis: Subcutaneous heparin Code Status: Full code Family Communication: No family at bedside Disposition:  Hopefully return home when improved Consults called: None. Admission Status: Status is: Observation  The patient remains OBS appropriate and will d/c before 2 midnights.  Dispo: The patient is from: Home              Anticipated d/c is to: Home              Anticipated d/c date is: 1 day              Patient currently is not medically stable to d/c.     Severity of Illness: The appropriate patient status for this patient is OBSERVATION. Observation status is judged to be reasonable and necessary in order to provide the required intensity of service to ensure the patient's safety. The patient's presenting symptoms, physical exam findings, and initial radiographic and laboratory data in the context of their medical condition is felt to place them at decreased risk for further clinical deterioration. Furthermore, it is anticipated that the patient will be medically stable for discharge from the hospital within 2 midnights of admission. The following factors support the patient status of observation.   " The patient's presenting symptoms include fatigue, nausea vomiting. " The physical exam findings include generalized weakness. " The initial radiographic and laboratory data are hyperglycemia, acute renal failure.   Further management decisions will depend on results of further testing and patient's response to treatment.   Oren Barella [employer]  [facility] operational officer on [email or web address]  02/01/2020, 12:29 AM

## 2020-01-31 NOTE — ED Notes (Signed)
Date and time results received: 01/31/20 2138  (use smartphrase ".now" to insert current time)  Test: Glucose Critical Value: 543  Name of Provider Notified: Dr.Haviland  Orders Received? Or Actions Taken?: Notified MD

## 2020-01-31 NOTE — ED Provider Notes (Addendum)
[facility] DEPT Provider Note   CSN: [id number] Arrival date & time: 01/31/20  1740     History Chief Complaint  Patient presents with  . Hyperglycemia    David Sharp is a 74 y.o. male.  Pt presents to the ED today with elevated blood sugar.  Pt was diagnosed with Covid on 10/20.  He had the mab infusion on 10/21.  I received a call from hospital at home nurses.  They have been giving him fluids and extra insulin doses.  They checked his blood sugar was over 700.  Pt feels very weak and has been having n/v.          Past Medical History:  Diagnosis Date  . Acute urinary retention   . Arthritis   . BPH (benign prostatic hyperplasia)   . Foley catheter in place   . GERD (gastroesophageal reflux disease)   . Hyperlipidemia   . Hypertension   . Type 2 diabetes mellitus ([place])   . Wears glasses     Patient Active Problem List   Diagnosis Date Noted  . BPH with urinary obstruction 01/27/2016    Past Surgical History:  Procedure Laterality Date  . CATARACT EXTRACTION W/ INTRAOCULAR LENS IMPLANT Right 1990's  . INGUINAL HERNIA REPAIR Right 2007  approx  . RETINAL DETACHMENT SURGERY Bilateral last one , left eye 1997/  right eye 1990's  . TRANSURETHRAL RESECTION OF PROSTATE N/A 01/27/2016   Procedure: TRANSURETHRAL RESECTION OF THE PROSTATE (TURP) WITH GYRUS;  Surgeon: Kathie Rhodes, MD;  Location: [facility];  Service: Urology;  Laterality: N/A;       History reviewed. No pertinent family history.  Social History   Tobacco Use  . Smoking status: Never Smoker  . Smokeless tobacco: Never Used  Vaping Use  . Vaping Use: Never used  Substance Use Topics  . Alcohol use: Yes    Comment: VERY RARE  . Drug use: No    Home Medications Prior to Admission medications   Medication Sig Start Date End Date Taking? Authorizing Provider  acetaminophen (TYLENOL) 500 MG tablet Take 1,000 mg by mouth every 6 (six) hours as  needed for moderate pain.   Yes [provider]  amLODipine (NORVASC) 5 MG tablet Take 5 mg by mouth every morning.  12/04/15  Yes [provider]  Ascorbic Acid (VITAMIN C) 1000 MG tablet Take 1,000 mg by mouth daily.   Yes [provider]  aspirin EC 81 MG tablet Take 81 mg by mouth daily.   Yes [provider]  atorvastatin (LIPITOR) 10 MG tablet Take 10 mg by mouth every evening.  12/04/15  Yes [provider]  Cholecalciferol (VITAMIN D) 125 MCG (5000 UT) CAPS Take 5,000 Units by mouth daily.   Yes [provider]  glyBURIDE-metformin (GLUCOVANCE) 2.5-500 MG tablet Take 1 tablet by mouth 2 (two) times daily. 11/18/15  Yes [provider]  lisinopril (ZESTRIL) 40 MG tablet Take 40 mg by mouth daily.   Yes [provider]  Multiple Vitamin (MULTIVITAMIN) tablet Take 1 tablet by mouth daily.   Yes [provider]  Omega-3 Fatty Acids (FISH OIL) 1200 MG CAPS Take 1 capsule by mouth in the morning, at noon, and at bedtime.    Yes [provider]  zinc gluconate 50 MG tablet Take 50 mg by mouth daily.   Yes [provider]  ciprofloxacin (CIPRO) 500 MG tablet Take 500 mg by mouth 2 (two) times  daily. Patient not taking: Reported on 01/31/2020    [provider]  ciprofloxacin (CIPRO) 500 MG tablet Take 1 tablet (500 mg total) by mouth 2 (two) times daily. Patient not taking: Reported on 01/31/2020 01/27/16   Kathie Rhodes, MD  HYDROcodone-acetaminophen [facility]) 10-325 MG tablet Take 1-2 tablets by mouth every 4 (four) hours as needed for moderate pain. Maximum dose per 24 hours -8 pills. Patient not taking: Reported on 01/31/2020 01/27/16   Kathie Rhodes, MD  lidocaine (LIDODERM) 5 % Place 1 patch onto the skin daily. Remove & Discard patch within 12 hours or as directed by MD Patient not taking: Reported on 01/31/2020 04/12/17   Ocie Cornfield T, PA-C  methylPREDNISolone (MEDROL DOSEPAK) 4 MG TBPK  tablet Take as prescribed Patient not taking: Reported on 01/31/2020 04/12/17   Ocie Cornfield T, PA-C  naproxen (NAPROSYN) 375 MG tablet Take 1 tablet (375 mg total) by mouth 2 (two) times daily. Patient not taking: Reported on 01/31/2020 04/12/17   Ocie Cornfield T, PA-C  phenazopyridine (PYRIDIUM) 200 MG tablet Take 1 tablet (200 mg total) by mouth 3 (three) times daily as needed for pain. Patient not taking: Reported on 01/31/2020 01/27/16   Kathie Rhodes, MD  traMADol (ULTRAM) 50 MG tablet Take 1 tablet (50 mg total) by mouth every 6 (six) hours as needed. Patient not taking: Reported on 01/31/2020 04/12/17   Ocie Cornfield T, PA-C    Allergies    Amoxicillin  Review of Systems   Review of Systems  Gastrointestinal: Positive for nausea and vomiting.  Neurological: Positive for weakness.  All other systems reviewed and are negative.   Physical Exam Updated Vital Signs BP (!) 151/69   Pulse 68   Temp 97.9 F (36.6 C) (Oral)   Resp 17   Ht 6\' 1"  (1.854 m)   Wt 113.4 kg   SpO2 98%   BMI 32.98 kg/m   Physical Exam Vitals and nursing note reviewed.  Constitutional:      Appearance: Normal appearance.  HENT:     Head: Normocephalic and atraumatic.     Right Ear: External ear normal.     Left Ear: External ear normal.     Nose: Nose normal.     Mouth/Throat:     Mouth: Mucous membranes are dry.  Eyes:     Extraocular Movements: Extraocular movements intact.     Conjunctiva/sclera: Conjunctivae normal.     Pupils: Pupils are equal, round, and reactive to light.  Cardiovascular:     Rate and Rhythm: Normal rate and regular rhythm.     Pulses: Normal pulses.     Heart sounds: Normal heart sounds.  Pulmonary:     Effort: Pulmonary effort is normal.     Breath sounds: Normal breath sounds.  Abdominal:     General: Abdomen is flat. Bowel sounds are normal.     Palpations: Abdomen is soft.  Musculoskeletal:        General: Normal range of motion.     Cervical back:  Normal range of motion and neck supple.  Skin:    General: Skin is warm.     Capillary Refill: Capillary refill takes less than 2 seconds.  Neurological:     General: No focal deficit present.     Mental Status: He is alert and oriented to person, place, and time.     Comments: Diffusely weak  Psychiatric:        Mood and Affect: Mood normal.  Behavior: Behavior normal.        Thought Content: Thought content normal.        Judgment: Judgment normal.     ED Results / Procedures / Treatments   Labs (all labs ordered are listed, but only abnormal results are displayed) Labs Reviewed  BASIC METABOLIC PANEL - Abnormal; Notable for the following components:      Result Value   Sodium 134 (*)    Potassium 5.6 (*)    CO2 20 (*)    Glucose, Bld 543 (*)    BUN 53 (*)    Creatinine, Ser 1.79 (*)    GFR, Estimated 40 (*)    All other components within normal limits  CBC - Abnormal; Notable for the following components:   WBC 22.5 (*)    All other components within normal limits  URINALYSIS, ROUTINE W REFLEX MICROSCOPIC - Abnormal; Notable for the following components:   Color, Urine STRAW (*)    Glucose, UA >=500 (*)    Hgb urine dipstick SMALL (*)    All other components within normal limits  BLOOD GAS, VENOUS - Abnormal; Notable for the following components:   pCO2, Ven 40.7 (*)    pO2, Ven 30.0 (*)    Acid-base deficit 3.5 (*)    All other components within normal limits  CBG MONITORING, ED - Abnormal; Notable for the following components:   Glucose-Capillary 594 (*)    All other components within normal limits  CBG MONITORING, ED - Abnormal; Notable for the following components:   Glucose-Capillary 578 (*)    All other components within normal limits  CBG MONITORING, ED - Abnormal; Notable for the following components:   Glucose-Capillary 538 (*)    All other components within normal limits  CBG MONITORING, ED - Abnormal; Notable for the following components:    Glucose-Capillary 438 (*)    All other components within normal limits  RESPIRATORY PANEL BY RT PCR (FLU A&B, COVID)  BETA-HYDROXYBUTYRIC ACID  BETA-HYDROXYBUTYRIC ACID  OSMOLALITY  BETA-HYDROXYBUTYRIC ACID    EKG EKG Interpretation  Date/Time:  Wednesday January 31 2020 20:02:49 EDT Ventricular Rate:  76 PR Interval:    QRS Duration: 101 QT Interval:  358 QTC Calculation: 403 R Axis:   10 Text Interpretation: Sinus rhythm No significant change since last tracing Confirmed by Isla Pence [phone]) on 01/31/2020 10:10:29 PM   Radiology DG Chest 2 View  Result Date: 01/31/2020 CLINICAL DATA:  Weakness EXAM: CHEST - 2 VIEW COMPARISON:  None. FINDINGS: Cardiac shadows within normal limits. Patchy atelectatic changes are noted in the bases bilaterally. No focal confluent infiltrate is seen. No sizable effusion is noted. No acute bony abnormality is seen. IMPRESSION: Patchy bibasilar atelectasis. Electronically Signed   By: Inez Catalina M.D.   On: 01/31/2020 18:59    Procedures Procedures (including critical care time)  Medications Ordered in ED Medications  insulin regular, human (MYXREDLIN) 100 units/ 100 mL infusion (8.5 Units/hr Intravenous Rate/Dose Change 01/31/20 2226)  lactated ringers infusion ( Intravenous New Bag/Given 01/31/20 2044)  dextrose 5 % in lactated ringers infusion (has no administration in time range)  dextrose 50 % solution 0-50 mL (has no administration in time range)  lactated ringers bolus 1,000 mL (1,000 mLs Intravenous New Bag/Given 01/31/20 2045)  ondansetron (ZOFRAN) injection 4 mg (4 mg Intravenous Given 01/31/20 2105)    ED Course  I have reviewed the triage vital signs and the nursing notes.  Pertinent labs & imaging results that were available  during my care of the patient were reviewed by me and considered in my medical decision making (see chart for details).    MDM Rules/Calculators/A&P                          Pt started on an insulin  drip and is given IVFs.  BS is coming down slowly.  Pt is dehydrated and still looks extremely weak.  BUN 22 and Cr 1.15 on 09/18/19, so pt has some aki as well.  Pt d/w Dr. [place] Pink ([facility]).  CRITICAL CARE Performed by: Isla Pence   Total critical care time: 30 minutes  Critical care time was exclusive of separately billable procedures and treating other patients.  Critical care was necessary to treat or prevent imminent or life-threatening deterioration.  Critical care was time spent personally by me on the following activities: development of treatment plan with patient and/or surrogate as well as nursing, discussions with consultants, evaluation of patient's response to treatment, examination of patient, obtaining history from patient or surrogate, ordering and performing treatments and interventions, ordering and review of laboratory studies, ordering and review of radiographic studies, pulse oximetry and re-evaluation of patient's condition.  David Sharp was evaluated in Emergency Department on 01/31/2020 for the symptoms described in the history of present illness. He was evaluated in the context of the global COVID-19 pandemic, which necessitated consideration that the patient might be at risk for infection with the SARS-CoV-2 virus that causes COVID-19. Institutional protocols and algorithms that pertain to the evaluation of patients at risk for COVID-19 are in a state of rapid change based on information released by regulatory bodies including the CDC and federal and state organizations. These policies and algorithms were followed during the patient's care in the ED. Final Clinical Impression(s) / ED Diagnoses Final diagnoses:  Dehydration  Poorly controlled type 2 diabetes mellitus (HCC)  Non-intractable vomiting with nausea, unspecified vomiting type  AKI (acute kidney injury) [facility])    Rx / DC Orders ED Discharge Orders    None       Isla Pence, MD 01/31/20  2251    Isla Pence, MD 02/12/20 [phone]

## 2020-01-31 NOTE — ED Triage Notes (Signed)
Patient's CBG- 594. Patient takes Metformin and glyburide. patient states he is compliant with medications. Patient also c/o nausea and abdominal pain.

## 2020-01-31 NOTE — ED Notes (Signed)
Date and time results received: 01/31/20 2319 (use smartphrase ".now" to insert current time)  Test: COVID 19 Critical Value: Posiitive  Name of Provider Notified: Haviland  Orders Received? Or Actions Taken?: expected value

## 2020-02-01 DIAGNOSIS — Z79899 Other long term (current) drug therapy: Secondary | ICD-10-CM | POA: Diagnosis not present

## 2020-02-01 DIAGNOSIS — Z7982 Long term (current) use of aspirin: Secondary | ICD-10-CM | POA: Diagnosis not present

## 2020-02-01 DIAGNOSIS — N400 Enlarged prostate without lower urinary tract symptoms: Secondary | ICD-10-CM | POA: Diagnosis present

## 2020-02-01 DIAGNOSIS — E871 Hypo-osmolality and hyponatremia: Secondary | ICD-10-CM | POA: Diagnosis present

## 2020-02-01 DIAGNOSIS — I4891 Unspecified atrial fibrillation: Secondary | ICD-10-CM | POA: Diagnosis not present

## 2020-02-01 DIAGNOSIS — I10 Essential (primary) hypertension: Secondary | ICD-10-CM | POA: Diagnosis present

## 2020-02-01 DIAGNOSIS — U071 COVID-19: Secondary | ICD-10-CM | POA: Diagnosis present

## 2020-02-01 DIAGNOSIS — N179 Acute kidney failure, unspecified: Secondary | ICD-10-CM | POA: Diagnosis present

## 2020-02-01 DIAGNOSIS — I480 Paroxysmal atrial fibrillation: Secondary | ICD-10-CM | POA: Diagnosis present

## 2020-02-01 DIAGNOSIS — E785 Hyperlipidemia, unspecified: Secondary | ICD-10-CM | POA: Diagnosis present

## 2020-02-01 DIAGNOSIS — E1100 Type 2 diabetes mellitus with hyperosmolarity without nonketotic hyperglycemic-hyperosmolar coma (NKHHC): Secondary | ICD-10-CM | POA: Diagnosis present

## 2020-02-01 DIAGNOSIS — R7989 Other specified abnormal findings of blood chemistry: Secondary | ICD-10-CM | POA: Diagnosis not present

## 2020-02-01 DIAGNOSIS — E875 Hyperkalemia: Secondary | ICD-10-CM | POA: Diagnosis present

## 2020-02-01 DIAGNOSIS — E1165 Type 2 diabetes mellitus with hyperglycemia: Secondary | ICD-10-CM | POA: Diagnosis present

## 2020-02-01 DIAGNOSIS — Z881 Allergy status to other antibiotic agents status: Secondary | ICD-10-CM | POA: Diagnosis not present

## 2020-02-01 DIAGNOSIS — Z8249 Family history of ischemic heart disease and other diseases of the circulatory system: Secondary | ICD-10-CM | POA: Diagnosis not present

## 2020-02-01 DIAGNOSIS — E861 Hypovolemia: Secondary | ICD-10-CM | POA: Diagnosis present

## 2020-02-01 DIAGNOSIS — R739 Hyperglycemia, unspecified: Secondary | ICD-10-CM | POA: Diagnosis not present

## 2020-02-01 DIAGNOSIS — E860 Dehydration: Secondary | ICD-10-CM | POA: Diagnosis present

## 2020-02-01 DIAGNOSIS — K219 Gastro-esophageal reflux disease without esophagitis: Secondary | ICD-10-CM | POA: Diagnosis present

## 2020-02-01 DIAGNOSIS — M1990 Unspecified osteoarthritis, unspecified site: Secondary | ICD-10-CM | POA: Diagnosis present

## 2020-02-01 DIAGNOSIS — Z9841 Cataract extraction status, right eye: Secondary | ICD-10-CM | POA: Diagnosis not present

## 2020-02-01 DIAGNOSIS — R112 Nausea with vomiting, unspecified: Secondary | ICD-10-CM | POA: Diagnosis not present

## 2020-02-01 DIAGNOSIS — Z961 Presence of intraocular lens: Secondary | ICD-10-CM | POA: Diagnosis present

## 2020-02-01 LAB — BASIC METABOLIC PANEL
Anion gap: 10 (ref 5–15)
BUN: 44 mg/dL — ABNORMAL HIGH (ref 8–23)
CO2: 21 mmol/L — ABNORMAL LOW (ref 22–32)
Calcium: 9 mg/dL (ref 8.9–10.3)
Chloride: 103 mmol/L (ref 98–111)
Creatinine, Ser: 1.45 mg/dL — ABNORMAL HIGH (ref 0.61–1.24)
GFR, Estimated: 51 mL/min — ABNORMAL LOW (ref >60)
Glucose, Bld: 326 mg/dL — ABNORMAL HIGH (ref 70–99)
Potassium: 4.6 mmol/L (ref 3.5–5.1)
Sodium: 134 mmol/L — ABNORMAL LOW (ref 135–145)

## 2020-02-01 LAB — HEMOGLOBIN A1C
Hgb A1c MFr Bld: 11.3 % — ABNORMAL HIGH (ref 4.8–5.6)
Mean Plasma Glucose: 277.61 mg/dL

## 2020-02-01 LAB — CBC
HCT: 40.4 % (ref 39.0–52.0)
Hemoglobin: 13.7 g/dL (ref 13.0–17.0)
MCH: 31 pg (ref 26.0–34.0)
MCHC: 33.9 g/dL (ref 30.0–36.0)
MCV: 91.4 fL (ref 80.0–100.0)
Platelets: 261 10*3/uL (ref 150–400)
RBC: 4.42 MIL/uL (ref 4.22–5.81)
RDW: 13 % (ref 11.5–15.5)
WBC: 23.1 10*3/uL — ABNORMAL HIGH (ref 4.0–10.5)
nRBC: 0 % (ref 0.0–0.2)

## 2020-02-01 LAB — COMPREHENSIVE METABOLIC PANEL
ALT: 22 U/L (ref 0–44)
AST: 22 U/L (ref 15–41)
Albumin: 3.6 g/dL (ref 3.5–5.0)
Alkaline Phosphatase: 70 U/L (ref 38–126)
Anion gap: 12 (ref 5–15)
BUN: 50 mg/dL — ABNORMAL HIGH (ref 8–23)
CO2: 22 mmol/L (ref 22–32)
Calcium: 9.5 mg/dL (ref 8.9–10.3)
Chloride: 104 mmol/L (ref 98–111)
Creatinine, Ser: 1.48 mg/dL — ABNORMAL HIGH (ref 0.61–1.24)
GFR, Estimated: 50 mL/min — ABNORMAL LOW (ref >60)
Glucose, Bld: 207 mg/dL — ABNORMAL HIGH (ref 70–99)
Potassium: 4.4 mmol/L (ref 3.5–5.1)
Sodium: 138 mmol/L (ref 135–145)
Total Bilirubin: 1.1 mg/dL (ref 0.3–1.2)
Total Protein: 6.9 g/dL (ref 6.5–8.1)

## 2020-02-01 LAB — CBG MONITORING, ED
Glucose-Capillary: 115 mg/dL — ABNORMAL HIGH (ref 70–99)
Glucose-Capillary: 202 mg/dL — ABNORMAL HIGH (ref 70–99)
Glucose-Capillary: 226 mg/dL — ABNORMAL HIGH (ref 70–99)
Glucose-Capillary: 309 mg/dL — ABNORMAL HIGH (ref 70–99)

## 2020-02-01 LAB — GLUCOSE, CAPILLARY
Glucose-Capillary: 230 mg/dL — ABNORMAL HIGH (ref 70–99)
Glucose-Capillary: 243 mg/dL — ABNORMAL HIGH (ref 70–99)
Glucose-Capillary: 286 mg/dL — ABNORMAL HIGH (ref 70–99)
Glucose-Capillary: 313 mg/dL — ABNORMAL HIGH (ref 70–99)
Glucose-Capillary: 326 mg/dL — ABNORMAL HIGH (ref 70–99)
Glucose-Capillary: 355 mg/dL — ABNORMAL HIGH (ref 70–99)

## 2020-02-01 LAB — BETA-HYDROXYBUTYRIC ACID
Beta-Hydroxybutyric Acid: 0.07 mmol/L (ref 0.05–0.27)
Beta-Hydroxybutyric Acid: 2.26 mmol/L — ABNORMAL HIGH (ref 0.05–0.27)

## 2020-02-01 LAB — MRSA PCR SCREENING: MRSA by PCR: NEGATIVE

## 2020-02-01 LAB — TSH: TSH: 0.803 u[IU]/mL (ref 0.350–4.500)

## 2020-02-01 LAB — OSMOLALITY: Osmolality: 325 mOsm/kg (ref 275–295)

## 2020-02-01 MED ORDER — INSULIN GLARGINE 100 UNIT/ML ~~LOC~~ SOLN
10.0000 [IU] | Freq: Two times a day (BID) | SUBCUTANEOUS | Status: DC
  Administered 2020-02-01 – 2020-02-02 (×2): 10 [IU] via SUBCUTANEOUS
  Filled 2020-02-01 (×2): qty 0.1

## 2020-02-01 MED ORDER — LACTATED RINGERS IV SOLN: INTRAVENOUS | Status: DC

## 2020-02-01 MED ORDER — INSULIN ASPART 100 UNIT/ML ~~LOC~~ SOLN
0.0000 [IU] | Freq: Three times a day (TID) | SUBCUTANEOUS | Status: DC
  Administered 2020-02-01: 11 [IU] via SUBCUTANEOUS
  Administered 2020-02-02: 5 [IU] via SUBCUTANEOUS

## 2020-02-01 MED ORDER — INSULIN ASPART 100 UNIT/ML ~~LOC~~ SOLN
0.0000 [IU] | Freq: Every day | SUBCUTANEOUS | Status: DC
  Administered 2020-02-01: 5 [IU] via SUBCUTANEOUS
  Filled 2020-02-01: qty 0.05

## 2020-02-01 MED ORDER — DILTIAZEM HCL 30 MG PO TABS
30.0000 mg | ORAL_TABLET | Freq: Four times a day (QID) | ORAL | Status: DC
  Administered 2020-02-01 – 2020-02-02 (×4): 30 mg via ORAL
  Filled 2020-02-01 (×4): qty 1

## 2020-02-01 MED ORDER — ENOXAPARIN SODIUM 120 MG/0.8ML ~~LOC~~ SOLN
110.0000 mg | Freq: Once | SUBCUTANEOUS | Status: AC
  Administered 2020-02-01: 110 mg via SUBCUTANEOUS
  Filled 2020-02-01: qty 0.74

## 2020-02-01 MED ORDER — INSULIN ASPART 100 UNIT/ML ~~LOC~~ SOLN
0.0000 [IU] | Freq: Three times a day (TID) | SUBCUTANEOUS | Status: DC
  Administered 2020-02-01: 7 [IU] via SUBCUTANEOUS
  Administered 2020-02-01: 3 [IU] via SUBCUTANEOUS
  Filled 2020-02-01: qty 0.09

## 2020-02-01 MED ORDER — DILTIAZEM HCL 25 MG/5ML IV SOLN
10.0000 mg | Freq: Once | INTRAVENOUS | Status: AC
  Administered 2020-02-01: 10 mg via INTRAVENOUS
  Filled 2020-02-01: qty 5

## 2020-02-01 MED ORDER — INSULIN GLARGINE 100 UNIT/ML ~~LOC~~ SOLN: 10.0000 [IU] | Freq: Every day | SUBCUTANEOUS | Status: DC

## 2020-02-01 MED ORDER — ENOXAPARIN SODIUM 120 MG/0.8ML ~~LOC~~ SOLN
110.0000 mg | Freq: Once | SUBCUTANEOUS | Status: AC
  Administered 2020-02-02: 110 mg via SUBCUTANEOUS
  Filled 2020-02-01: qty 0.74

## 2020-02-01 MED ORDER — INSULIN ASPART 100 UNIT/ML ~~LOC~~ SOLN: 0.0000 [IU] | Freq: Every day | SUBCUTANEOUS | Status: DC

## 2020-02-01 MED ORDER — INSULIN GLARGINE 100 UNIT/ML ~~LOC~~ SOLN
10.0000 [IU] | Freq: Every day | SUBCUTANEOUS | Status: DC
  Administered 2020-02-01: 10 [IU] via SUBCUTANEOUS
  Filled 2020-02-01: qty 0.1

## 2020-02-01 MED ORDER — ENOXAPARIN SODIUM 120 MG/0.8ML ~~LOC~~ SOLN
110.0000 mg | Freq: Two times a day (BID) | SUBCUTANEOUS | Status: DC
  Administered 2020-02-02: 110 mg via SUBCUTANEOUS
  Filled 2020-02-01: qty 0.74

## 2020-02-01 MED ORDER — CHLORHEXIDINE GLUCONATE CLOTH 2 % EX PADS
6.0000 | MEDICATED_PAD | Freq: Every day | CUTANEOUS | Status: DC
  Administered 2020-02-01 – 2020-02-03 (×3): 6 via TOPICAL

## 2020-02-01 NOTE — ED Notes (Signed)
Date and time results received: 02/01/20 0035 (use smartphrase ".now" to insert current time)  Test: Serum Osmolaity  Critical Value: 325  Name of Provider Notified: Dr.Krishnan  Orders Received? Or Actions Taken?: MD notified

## 2020-02-01 NOTE — Progress Notes (Signed)
Inpatient Diabetes Program Recommendations  AACE/ADA: New Consensus Statement on Inpatient Glycemic Control (2015)  Target Ranges:  Prepandial:   less than 140 mg/dL      Peak postprandial:   less than 180 mg/dL (1-2 hours)      Critically ill patients:  140 - 180 mg/dL   Lab Results  Component Value Date   GLUCAP 326 (H) 02/01/2020   HGBA1C 11.3 (H) 02/01/2020    Review of Glycemic Control  Diabetes history: DM2 Outpatient Diabetes medications: Glucovance 2.5/500 mg bid Current orders for Inpatient glycemic control: Lantus 10 units QHS, Novolog 0-15 units tidwc and hs  HgbA1C - 11.3%  Inpatient Diabetes Program Recommendations:     Increase Lantus to 10 units bid Add Novolog 4 units tidwc for meal coverage insulin if eating > 50% meal BMET  Change diet to CHO mod med.  Will likely need insulin at discharge with HgbA1C of 11.3%.  Thank you. Lorenda Peck, RD, LDN, CDE Inpatient Diabetes Coordinator [phone]

## 2020-02-01 NOTE — Progress Notes (Signed)
PROGRESS NOTE  David Sharp DDU:[id number] DOB: 21-Apr-1945 DOA: 01/31/2020 PCP: Patient, No Pcp Per   LOS: 0 days   Brief narrative: As per HPI,   David Sharp is a 74 y.o. male with a past medical history of diabetes mellitus type 2, essential hypertension with history of  COVID-19 on 01/17/20, status post  monoclonal antibody treatment on 10/21 was subsequently noted to have poor oral intake nausea vomiting.  He did not have any respiratory symptoms.  He was given prescription for erythromycin but his blood glucose levels were elevated. Despite getting insulin home health nurses were not able to do blood glucose levels so he was send in to the hospital for further evaluation. In the emergency department he was found to have a potassium of 5.6, sodium of 134 glucose of 543.  Anion gap was normal.    WBC was mildly elevated. Patient was started on insulin infusion and hospitalized for further management.  Patient was adequately hydrated.  He however developed atrial fibrillation on 02/01/2020.  Assessment/Plan:  Active Problems:   Hyperglycemia   ARF (acute renal failure) (HCC)   Hyponatremia   Hyperkalemia   Essential hypertension   COVID-19 virus infection   Hyperosmolar hyperglycemic state (HHS) (HCC) Paroxysmal atrial fibrillation  Hyperosmolar hyperglycemic state.  History of diabetes mellitus type 2.  Uncontrolled.  Hemoglobin A1c of 11.3 at this time.  Patient was on oral hypoglycemic agents at home including Metformin and glyburide.  At this time patient has been transitioned to long-acting and sliding scale insulin.  Will change to moderate intensity sliding scale insulin at this time.  He was initially on insulin drip.  Continue Ringer lactate at 125 mL/h.  Closely monitor blood glucose level.  Patient had glucose levels of 543 on presentation.  Patient will benefit from long-acting insulin on discharge.  New onset paroxysmal atrial fibrillation.  No prior history but stated that he  used to have palpitation in the past.  Elevated CHA2DS2-VASc score more than 2.  Continue IV hydration.  We will put the patient on Cardizem p.o.  Received 1 dose of IV Cardizem today.  Spoke with the patient regarding anticoagulation.  Check TSH.  Will communicate with pharmacy to change to therapeutic dose.  Recent COVID-19 infection: No respiratory symptoms. Mild disease.  He received monoclonal antibody treatment on October 21.  Chest x-Elijha with atelectasis.  Continue to monitor closely.  No remdesivir, Solu-Medrol planned at this time.  Acute kidney injury present on admission.  Likely secondary to poor oral intake and ACE inhibitor's.  Continue hydration.  Check levels in a.m.  Creatinine has trended down from 1.721.4 today.  Hyponatremia likely hypovolemic.  Continue IV fluids.  Check BMP in a.m.  BNP has improved at this time.  Hyperkalemia.  Has improved at this time after insulin.  Replenish as necessary.  Latest potassium of 4.4.  Leukocytosis likely reactive.  No obvious source of infection.  We will continue to monitor closely.  Nausea, vomiting.  Has improved at this time.    Essential hypertension.  Continue amlodipine.  Hold ACE inhibitor.  Closely monitor.  And as needed antihypertensives for now.  History of benign prostatic hypertrophy.  Not on medications.   DVT prophylaxis: heparin injection 5,000 Units Start: 01/31/20 2345   Code Status: Full code  Family Communication: I tried to reach the patient's wife on the phone few times but was unable to reach her  Status is: observation  Remains inpatient appropriate because:IV treatments appropriate  due to intensity of illness or inability to take PO and Inpatient level of care appropriate due to severity of illness   Dispo: The patient is from: Home              Anticipated d/c is to: Home              Anticipated d/c date is: 2 days              Patient currently is not medically stable to  d/c.  Consultants:  None  Procedures:  None  Antibiotics:  . None  Anti-infectives (From admission, onward)   None     Subjective: Today, patient was seen and examined at bedside.  Patient complains of generalized weakness.  Denies any chest pain palpitation shortness of breath cough or fever.  Denies any nausea, vomiting or abdominal pain today.  Objective: Vitals:   02/01/20 0900 02/01/20 0930  BP: (!) 171/78 (!) 157/74  Pulse: (!) 127 (!) 119  Resp: 17 17  Temp:    SpO2: 95% 93%    Intake/Output Summary (Last 24 hours) at 02/01/2020 1238 Last data filed at 02/01/2020 0404 Gross per 24 hour  Intake 1573.39 ml  Output --  Net 1573.39 ml   Filed Weights   01/31/20 1815  Weight: 113.4 kg   Body mass index is 32.98 kg/m.   Physical Exam: GENERAL: Patient is alert awake and communicative, obese,. Not in obvious distress. HENT: No scleral pallor or icterus. Pupils equally reactive to light. Oral mucosa is dry NECK: is supple, no gross swelling noted. CHEST:  Diminished breath sounds bilaterally.  No wheezes or crackles noted. CVS: S1 and S2 heard, irregularly irregular rhythm. ABDOMEN: Soft, non-tender, bowel sounds are present. EXTREMITIES: No edema. CNS: Cranial nerves are intact. No focal motor deficits.  Oriented. SKIN: warm and dry without rashes.  Data Review: I have personally reviewed the following laboratory data and studies,  CBC: Recent Labs  Lab 01/31/20 1818 02/01/20 0610  WBC 22.5* 23.1*  HGB 14.1 13.7  HCT 41.3 40.4  MCV 91.4 91.4  PLT 287 081   Basic Metabolic Panel: Recent Labs  Lab 01/31/20 1818 02/01/20 0610  NA 134* 138  K 5.6* 4.4  CL 101 104  CO2 20* 22  GLUCOSE 543* 207*  BUN 53* 50*  CREATININE 1.79* 1.48*  CALCIUM 9.9 9.5   Liver Function Tests: Recent Labs  Lab 02/01/20 0610  AST 22  ALT 22  ALKPHOS 70  BILITOT 1.1  PROT 6.9  ALBUMIN 3.6   No results for input(s): LIPASE, AMYLASE in the last 168  hours. No results for input(s): AMMONIA in the last 168 hours. Cardiac Enzymes: No results for input(s): CKTOTAL, CKMB, CKMBINDEX, TROPONINI in the last 168 hours. BNP (last 3 results) No results for input(s): BNP in the last 8760 hours.  ProBNP (last 3 results) No results for input(s): PROBNP in the last 8760 hours.  CBG: Recent Labs  Lab 02/01/20 0103 02/01/20 0232 02/01/20 0400 02/01/20 0802 02/01/20 1148  GLUCAP 226* 202* 115* 230* 313*   Recent Results (from the past 240 hour(s))  Respiratory Panel by RT PCR (Flu A&B, Covid) - Nasopharyngeal Swab     Status: Abnormal   Collection Time: 01/31/20  8:11 PM   Specimen: Nasopharyngeal Swab  Result Value Ref Range Status   SARS Coronavirus 2 by RT PCR POSITIVE (A) NEGATIVE Final    Comment: CRITICAL RESULT CALLED TO, READ BACK BY AND VERIFIED WITH: RN  JERRY AT [id number] 01/31/20 CRUICKSHANK A (NOTE) SARS-CoV-2 target nucleic acids are DETECTED.  SARS-CoV-2 RNA is generally detectable in upper respiratory specimens  during the acute phase of infection. Positive results are indicative of the presence of the identified virus, but do not rule out bacterial infection or co-infection with other pathogens not detected by the test. Clinical correlation with patient history and other diagnostic information is necessary to determine patient infection status. The expected result is Negative.  Fact Sheet for Patients:  PinkCheek.be  Fact Sheet for Healthcare Providers: GravelBags.it  This test is not yet approved or cleared by the [place] FDA and  has been authorized for detection and/or diagnosis of SARS-CoV-2 by FDA under an Emergency Use Authorization (EUA).  This EUA will remain in effect (meaning thi s test can be used) for the duration of  the COVID-19 declaration under Section 564(b)(1) of the Act, 21 U.S.C. section 360bbb-3(b)(1), unless the authorization  is terminated or revoked sooner.      Influenza A by PCR NEGATIVE NEGATIVE Final   Influenza B by PCR NEGATIVE NEGATIVE Final    Comment: (NOTE) The Xpert Xpress SARS-CoV-2/FLU/RSV assay is intended as an aid in  the diagnosis of influenza from Nasopharyngeal swab specimens and  should not be used as a sole basis for treatment. Nasal washings and  aspirates are unacceptable for Xpert Xpress SARS-CoV-2/FLU/RSV  testing.  Fact Sheet for Patients: PinkCheek.be  Fact Sheet for Healthcare Providers: GravelBags.it  This test is not yet approved or cleared by the [place] FDA and  has been authorized for detection and/or diagnosis of SARS-CoV-2 by  FDA under an Emergency Use Authorization (EUA). This EUA will remain  in effect (meaning this test can be used) for the duration of the  Covid-19 declaration under Section 564(b)(1) of the Act, 21  U.S.C. section 360bbb-3(b)(1), unless the authorization is  terminated or revoked. Performed at [facility], [place] [address], [place], [place] [address]   MRSA PCR Screening     Status: None   Collection Time: 02/01/20  4:49 AM   Specimen: Nasopharyngeal  Result Value Ref Range Status   MRSA by PCR NEGATIVE NEGATIVE Final    Comment:        The GeneXpert MRSA Assay (FDA approved for NASAL specimens only), is one component of a comprehensive MRSA colonization surveillance program. It is not intended to diagnose MRSA infection nor to guide or monitor treatment for MRSA infections. Performed at [facility], [place] [address], [place], [place] [address]      Studies: DG Chest 2 View  Result Date: 01/31/2020 CLINICAL DATA:  Weakness EXAM: CHEST - 2 VIEW COMPARISON:  None. FINDINGS: Cardiac shadows within normal limits. Patchy atelectatic changes are noted in the bases bilaterally. No focal confluent infiltrate is seen. No sizable effusion is  noted. No acute bony abnormality is seen. IMPRESSION: Patchy bibasilar atelectasis. Electronically Signed   By: Inez Catalina M.D.   On: 01/31/2020 18:59      Flora Lipps, MD  [facility] 02/01/2020

## 2020-02-01 NOTE — Progress Notes (Signed)
Patient rhythm switched to A fib RVR at 0820. EKG obtained and MD notified. Orders received for 10 mg IV cardizem once. MD renotified when heart rate still sustaining 110-140 post IV cardizem. Orders received for 30 mg Cardizem scheduled. Converted back to NSR 1445.

## 2020-02-01 NOTE — Progress Notes (Signed)
MD notified of BMET results- CO2 21 and anion gap [place] to transfer to telemetry at this time.   MD also notified of held Cardizem due to Sinus Bradycardia- HR 60 bpm. Orders modified with instructions to hold with HR <60

## 2020-02-01 NOTE — ED Notes (Signed)
Attempted to call report on pt to room 1422. Spoke with Network engineer and was told that they were not taking patient due to Covid + test. Agricultural consultant notified

## 2020-02-01 NOTE — Progress Notes (Signed)
RN notified MD of Beta Hydroxy of 2.26 at noon. RN also discussed insulin scale with. Orders placed for insulin scale change, no other orders received at this time.

## 2020-02-01 NOTE — Progress Notes (Signed)
OT Cancellation Note  Patient Details Name: David Sharp MRN: [id number] DOB: 13-May-1945   Cancelled Treatment:    Reason Eval/Treat Not Completed: Medical issues which prohibited therapy. Checked in x2 with RN reports patient in A-fib with RVR, HR up to 150s with ambulation to bathroom. Will check back 11/5  Delbert Phenix OT OT pager: [phone] 02/01/2020, 1:19 PM

## 2020-02-01 NOTE — Progress Notes (Signed)
PT Cancellation Note  Patient Details Name: David Sharp MRN: [id number] DOB: May 05, 1945   Cancelled Treatment:    Reason Eval/Treat Not Completed: Medical issues which prohibited therapy (RN reports patient in A-fib with RVR, HR up to 150s with ambulation to bathroom. Therapy to hold for now and check back at later date/time as pt able and schedule allows.)   Verner Mould, [place]  Office [phone] Pager [phone]  02/01/2020 1:39 PM

## 2020-02-02 ENCOUNTER — Inpatient Hospital Stay (HOSPITAL_COMMUNITY): Payer: PPO

## 2020-02-02 DIAGNOSIS — I4891 Unspecified atrial fibrillation: Secondary | ICD-10-CM | POA: Diagnosis not present

## 2020-02-02 DIAGNOSIS — R7989 Other specified abnormal findings of blood chemistry: Secondary | ICD-10-CM

## 2020-02-02 DIAGNOSIS — R112 Nausea with vomiting, unspecified: Secondary | ICD-10-CM

## 2020-02-02 LAB — ECHOCARDIOGRAM COMPLETE
Area-P 1/2: 2.99 cm2
Height: 73 in
S' Lateral: 3 cm
Weight: 4000 oz

## 2020-02-02 LAB — COMPREHENSIVE METABOLIC PANEL
ALT: 19 U/L (ref 0–44)
AST: 20 U/L (ref 15–41)
Albumin: 3.4 g/dL — ABNORMAL LOW (ref 3.5–5.0)
Alkaline Phosphatase: 65 U/L (ref 38–126)
Anion gap: 11 (ref 5–15)
BUN: 40 mg/dL — ABNORMAL HIGH (ref 8–23)
CO2: 20 mmol/L — ABNORMAL LOW (ref 22–32)
Calcium: 9.1 mg/dL (ref 8.9–10.3)
Chloride: 105 mmol/L (ref 98–111)
Creatinine, Ser: 1.67 mg/dL — ABNORMAL HIGH (ref 0.61–1.24)
GFR, Estimated: 43 mL/min — ABNORMAL LOW (ref >60)
Glucose, Bld: 211 mg/dL — ABNORMAL HIGH (ref 70–99)
Potassium: 4.5 mmol/L (ref 3.5–5.1)
Sodium: 136 mmol/L (ref 135–145)
Total Bilirubin: 1.3 mg/dL — ABNORMAL HIGH (ref 0.3–1.2)
Total Protein: 6.5 g/dL (ref 6.5–8.1)

## 2020-02-02 LAB — CBC
HCT: 41.5 % (ref 39.0–52.0)
Hemoglobin: 13.7 g/dL (ref 13.0–17.0)
MCH: 30.4 pg (ref 26.0–34.0)
MCHC: 33 g/dL (ref 30.0–36.0)
MCV: 92.2 fL (ref 80.0–100.0)
Platelets: 250 10*3/uL (ref 150–400)
RBC: 4.5 MIL/uL (ref 4.22–5.81)
RDW: 13.2 % (ref 11.5–15.5)
WBC: 19.9 10*3/uL — ABNORMAL HIGH (ref 4.0–10.5)
nRBC: 0 % (ref 0.0–0.2)

## 2020-02-02 LAB — PHOSPHORUS: Phosphorus: 2.6 mg/dL (ref 2.5–4.6)

## 2020-02-02 LAB — MAGNESIUM: Magnesium: 2.2 mg/dL (ref 1.7–2.4)

## 2020-02-02 LAB — GLUCOSE, CAPILLARY
Glucose-Capillary: 220 mg/dL — ABNORMAL HIGH (ref 70–99)
Glucose-Capillary: 250 mg/dL — ABNORMAL HIGH (ref 70–99)
Glucose-Capillary: 294 mg/dL — ABNORMAL HIGH (ref 70–99)
Glucose-Capillary: 201 mg/dL — ABNORMAL HIGH (ref 70–99)
Glucose-Capillary: 130 mg/dL — ABNORMAL HIGH (ref 70–99)
Glucose-Capillary: 115 mg/dL — ABNORMAL HIGH (ref 70–99)

## 2020-02-02 MED ORDER — APIXABAN 5 MG PO TABS
5.0000 mg | ORAL_TABLET | Freq: Two times a day (BID) | ORAL | Status: DC
  Administered 2020-02-02 – 2020-02-03 (×2): 5 mg via ORAL
  Filled 2020-02-02 (×2): qty 1

## 2020-02-02 MED ORDER — INSULIN GLARGINE 100 UNIT/ML ~~LOC~~ SOLN
15.0000 [IU] | Freq: Two times a day (BID) | SUBCUTANEOUS | Status: DC
  Administered 2020-02-02 – 2020-02-03 (×2): 15 [IU] via SUBCUTANEOUS
  Filled 2020-02-02 (×2): qty 0.15

## 2020-02-02 MED ORDER — INSULIN ASPART 100 UNIT/ML ~~LOC~~ SOLN
6.0000 [IU] | Freq: Three times a day (TID) | SUBCUTANEOUS | Status: DC
  Administered 2020-02-02 – 2020-02-03 (×3): 6 [IU] via SUBCUTANEOUS

## 2020-02-02 MED ORDER — INSULIN ASPART 100 UNIT/ML ~~LOC~~ SOLN: 0.0000 [IU] | Freq: Every day | SUBCUTANEOUS | Status: DC

## 2020-02-02 MED ORDER — DILTIAZEM HCL ER COATED BEADS 180 MG PO CP24
180.0000 mg | ORAL_CAPSULE | Freq: Every day | ORAL | Status: DC
  Administered 2020-02-02 – 2020-02-03 (×2): 180 mg via ORAL
  Filled 2020-02-02 (×2): qty 1

## 2020-02-02 MED ORDER — INSULIN ASPART 100 UNIT/ML ~~LOC~~ SOLN: 6.0000 [IU] | Freq: Three times a day (TID) | SUBCUTANEOUS | Status: DC

## 2020-02-02 MED ORDER — DILTIAZEM HCL 30 MG PO TABS
30.0000 mg | ORAL_TABLET | Freq: Four times a day (QID) | ORAL | Status: DC | PRN
  Filled 2020-02-02: qty 1

## 2020-02-02 MED ORDER — INSULIN ASPART 100 UNIT/ML ~~LOC~~ SOLN
0.0000 [IU] | Freq: Three times a day (TID) | SUBCUTANEOUS | Status: DC
  Administered 2020-02-02: 5 [IU] via SUBCUTANEOUS
  Administered 2020-02-02: 8 [IU] via SUBCUTANEOUS
  Administered 2020-02-03: 2 [IU] via SUBCUTANEOUS

## 2020-02-02 MED ORDER — SODIUM CHLORIDE 0.9 % IV SOLN: INTRAVENOUS | Status: DC

## 2020-02-02 MED ORDER — INSULIN ASPART 100 UNIT/ML ~~LOC~~ SOLN: 0.0000 [IU] | Freq: Three times a day (TID) | SUBCUTANEOUS | Status: DC

## 2020-02-02 NOTE — Progress Notes (Signed)
ANTICOAGULATION CONSULT NOTE - Initial Consult  Pharmacy Consult for apixaban Indication: atrial fibrillation  Allergies  Allergen Reactions  . Amoxicillin Hives    Patient Measurements: Height: 6\' 1"  (185.4 cm) Weight: 113.4 kg (250 lb) IBW/kg (Calculated) : 79.9  Vital Signs: Temp: 99.6 F (37.6 C) (11/05 0528) Temp Source: Oral (11/05 0528) BP: 126/69 (11/05 1101) Pulse Rate: 87 (11/05 0719)  Labs: Recent Labs    01/31/20 1818 01/31/20 1818 02/01/20 0610 02/01/20 1656 02/02/20 0354  HGB 14.1   < > 13.7  --  13.7  HCT 41.3  --  40.4  --  41.5  PLT 287  --  261  --  250  CREATININE 1.79*   < > 1.48* 1.45* 1.67*   < > = values in this interval not displayed.    Estimated Creatinine Clearance: 52.8 mL/min (A) (by C-G formula based on SCr of 1.67 mg/dL (H)).   Medical History: Past Medical History:  Diagnosis Date  . Acute urinary retention   . Arthritis   . BPH (benign prostatic hyperplasia)   . Foley catheter in place   . GERD (gastroesophageal reflux disease)   . Hyperlipidemia   . Hypertension   . Type 2 diabetes mellitus ([place])   . Wears glasses     Medications: No anticoagulants PTA  Assessment: Pt is a 74 yoM with PMH significant for T2DM, HTN, recent COVID-19 PNA (01/17/20). Pt now diagnosed with new onset atrial fibrillation, currently on therapeutic LMWH. Pharmacy consulted to transition to apixaban.  Age: 74 TBW: 113 kg  Anticoagulants: -11/4: Therapeutic LMWH -11/5: LMWH >> Apixaban  Today, 02/02/20  CBC: Hgb, Plt both WNL & stable  Scr 1.67, CrCl ~ 53 mL/min  Last dose of enoxaparin given 11/5 @ 1107   Plan:   Discontinue enoxaparin  Start apixaban 5 mg PO BID tonight  Medication counseling provided.   Lenis Noon, PharmD 02/02/2020,12:15 PM

## 2020-02-02 NOTE — Discharge Instructions (Signed)

## 2020-02-02 NOTE — Progress Notes (Signed)
Inpatient Diabetes Program Recommendations  AACE/ADA: New Consensus Statement on Inpatient Glycemic Control (2015)  Target Ranges:  Prepandial:   less than 140 mg/dL      Peak postprandial:   less than 180 mg/dL (1-2 hours)      Critically ill patients:  140 - 180 mg/dL   Lab Results  Component Value Date   GLUCAP 250 (H) 02/02/2020   HGBA1C 11.3 (H) 02/01/2020    Review of Glycemic Control   Current orders for Inpatient glycemic control:  Lantus 10 units bid Novolog 0-15 units tidwc and hs  Inpatient Diabetes Program Recommendations:     Increase Lantus to 15 units bid Add meal coverage - Novolog 6 units tidwc Change diet to CHO mod med  Will need to go home on insulin with HgbA1C of 11.3%. Will ask RN to teach insulin pen administration. Need to f/u with PCP for diabetes management.  Continue to follow while inpatient.   Thank you. Lorenda Peck, RD, LDN, CDE Inpatient Diabetes Coordinator [phone]

## 2020-02-02 NOTE — Progress Notes (Signed)
PROGRESS NOTE  David Sharp WNI:[id number] DOB: 1945/04/11   PCP: Patient, No Pcp Per  Patient is from: Home.  DOA: 01/31/2020 LOS: 1  Chief complaints: Nausea and vomiting  Brief Narrative / Interim history: 74 year old male with history of DM-2, HTN, recent COVID-80 diagnosis on 01/17/2020 s/p MAB infusion on 10/21 presenting with poor oral intake, nausea and vomiting, and admitted for HHS, new paroxysmal A. fib with RVR and AKI.  He was started on IV insulin and Cardizem drip.  Eventually transitioned to subcu insulin and p.o. Cardizem.  Did not have respiratory distress or oxygen requirement.  Subjective: Seen and examined earlier this morning.  No major events overnight of this morning.  No complaints other than generalized weakness and some nausea.  He denies emesis.  Denies chest pain, dyspnea, palpitation or UTI symptoms.  Denies prior history of GI bleed other than occasional blood streaked on stool with constipation.  Objective: Vitals:   02/02/20 0717 02/02/20 0719 02/02/20 1101 02/02/20 1346  BP: (!) 162/69 (!) 162/69 126/69 (!) 167/66  Pulse:  87  65  Resp:    18  Temp:    97.7 F (36.5 C)  TempSrc:    Oral  SpO2:  94%  96%  Weight:      Height:        Intake/Output Summary (Last 24 hours) at 02/02/2020 1617 Last data filed at 02/02/2020 0011 Gross per 24 hour  Intake 1292.57 ml  Output 50 ml  Net 1242.57 ml   Filed Weights   01/31/20 1815  Weight: 113.4 kg    Examination:  GENERAL: No apparent distress.  Nontoxic. HEENT: MMM.  Vision and hearing grossly intact.  NECK: Supple.  No apparent JVD.  RESP: On room air.  No IWOB.  Fair aeration bilaterally. CVS:  RRR. Heart sounds normal.  ABD/GI/GU: BS+. Abd soft, NTND.  MSK/EXT:  Moves extremities. No apparent deformity. No edema.  SKIN: no apparent skin lesion or wound NEURO: Awake, alert and oriented appropriately.  No apparent focal neuro deficit. PSYCH: Calm.  Somewhat flat affect.  Procedures:   None  Microbiology summarized: 11/3-COVID-19 PCR positive.  Assessment & Plan: Uncontrolled DM-2 with hyperglycemia: Did not meet criteria for HHS or DKA.  A1c 11.3%.  Initially treated with IV insulin and transitioned to subcu insulin. Recent Labs  Lab 02/01/20 2024 02/01/20 2341 02/02/20 0532 02/02/20 0747 02/02/20 1206  GLUCAP 243* 355* 220* 250* 294*  -Continue SSI-moderate -Add NovoLog 6 units AC -Add Lantus 15 units twice daily -Continue atorvastatin.  New onset paroxysmal A. fib with RVR: RVR resolved.  CHA2DS2-VASc score greater than 3.  No history of significant bleeding.  TSH within normal. -Consolidate to Cardizem CD 180 mg daily -P.o. Cardizem 30 mg every 6 hours as needed for for sustained HR> 120. -Start p.o. Eliquis for anticoagulation -Check echocardiogram to exclude valvular etiology  COVID-19 infection: Mild disease. S/p MAB on 10/21.  No respiratory symptoms. -Supportive care  AKI/azotemia: Likely prerenal from poor p.o. intake and emesis.  Also on ACE inhibitors which could contribute. Cr 1.49 (admit)>>> 1.67.  Worse despite IV fluid. -Continue IV LR -Avoid nephrotoxic meds -Urine chemistry and renal ultrasound -Recheck in the morning.  Nausea and emesis: Emesis seems to have resolved. -IV fluid and antiemetics  Hyponatremia: Corrects to normal for hyperglycemia.  Hyperkalemia: Due to AKI?  Resolved.  Leukocytosis: WBC 23>> 20.  Unclear source of this. No respiratory or UTI symptoms.  Denies recent steroid.  No significant fever other  than a mild temp to 99.6.  UA not suggestive for UTI.  CXR is negative. -Continue monitoring  Nausea, vomiting.  Has improved at this time.    Essential hypertension.  Continue amlodipine.  Hold ACE inhibitor.  Closely monitor.  And as needed antihypertensives for now.  History of BPH without LUTS: Not on meds.  Debility/generalized weakness: -PT/OT eval   Body mass index is 32.98 kg/m.         DVT  prophylaxis:   apixaban (ELIQUIS) tablet 5 mg  Code Status: Full code Family Communication: Patient and/or RN.  Attempted to call patient's wife but no answer.  Voicemail full. Status is: Inpatient  Remains inpatient appropriate because:Ongoing diagnostic testing needed not appropriate for outpatient work up, IV treatments appropriate due to intensity of illness or inability to take PO and Inpatient level of care appropriate due to severity of illness   Dispo: The patient is from: Home              Anticipated d/c is to: Home              Anticipated d/c date is: 1 day              Patient currently is not medically stable to d/c.       Consultants:  None   Sch Meds:  Scheduled Meds:  apixaban  5 mg Oral BID   atorvastatin  10 mg Oral QPM   Chlorhexidine Gluconate Cloth  6 each Topical Daily   diltiazem  180 mg Oral Daily   insulin aspart  0-15 Units Subcutaneous TID WC   insulin aspart  0-5 Units Subcutaneous QHS   insulin aspart  6 Units Subcutaneous TID WC   insulin glargine  15 Units Subcutaneous BID   Continuous Infusions:  lactated ringers 125 mL/hr at 02/02/20 0628   PRN Meds:.acetaminophen **OR** acetaminophen, dextrose, diltiazem, ondansetron **OR** ondansetron (ZOFRAN) IV  Antimicrobials: Anti-infectives (From admission, onward)   None       I have personally reviewed the following labs and images: CBC: Recent Labs  Lab 01/31/20 1818 02/01/20 0610 02/02/20 0354  WBC 22.5* 23.1* 19.9*  HGB 14.1 13.7 13.7  HCT 41.3 40.4 41.5  MCV 91.4 91.4 92.2  PLT 287 261 250   BMP &GFR Recent Labs  Lab 01/31/20 1818 02/01/20 0610 02/01/20 1656 02/02/20 0354  NA 134* 138 134* 136  K 5.6* 4.4 4.6 4.5  CL 101 104 103 105  CO2 20* 22 21* 20*  GLUCOSE 543* 207* 326* 211*  BUN 53* 50* 44* 40*  CREATININE 1.79* 1.48* 1.45* 1.67*  CALCIUM 9.9 9.5 9.0 9.1  MG  --   --   --  2.2  PHOS  --   --   --  2.6   Estimated Creatinine Clearance: 52.8  mL/min (A) (by C-G formula based on SCr of 1.67 mg/dL (H)). Liver & Pancreas: Recent Labs  Lab 02/01/20 0610 02/02/20 0354  AST 22 20  ALT 22 19  ALKPHOS 70 65  BILITOT 1.1 1.3*  PROT 6.9 6.5  ALBUMIN 3.6 3.4*   No results for input(s): LIPASE, AMYLASE in the last 168 hours. No results for input(s): AMMONIA in the last 168 hours. Diabetic: Recent Labs    02/01/20 0610  HGBA1C 11.3*   Recent Labs  Lab 02/01/20 2024 02/01/20 2341 02/02/20 0532 02/02/20 0747 02/02/20 1206  GLUCAP 243* 355* 220* 250* 294*   Cardiac Enzymes: No results for input(s): CKTOTAL, CKMB, CKMBINDEX,  TROPONINI in the last 168 hours. No results for input(s): PROBNP in the last 8760 hours. Coagulation Profile: No results for input(s): INR, PROTIME in the last 168 hours. Thyroid Function Tests: Recent Labs    02/01/20 1354  TSH 0.803   Lipid Profile: No results for input(s): CHOL, HDL, LDLCALC, TRIG, CHOLHDL, LDLDIRECT in the last 72 hours. Anemia Panel: No results for input(s): VITAMINB12, FOLATE, FERRITIN, TIBC, IRON, RETICCTPCT in the last 72 hours. Urine analysis:    Component Value Date/Time   COLORURINE STRAW (A) 01/31/2020 1818   APPEARANCEUR CLEAR 01/31/2020 1818   LABSPEC 1.025 01/31/2020 1818   PHURINE 5.0 01/31/2020 1818   GLUCOSEU >=500 (A) 01/31/2020 1818   HGBUR SMALL (A) 01/31/2020 1818   BILIRUBINUR NEGATIVE 01/31/2020 [place] 01/31/2020 1818   PROTEINUR NEGATIVE 01/31/2020 1818   NITRITE NEGATIVE 01/31/2020 1818   LEUKOCYTESUR NEGATIVE 01/31/2020 1818   Sepsis Labs: Invalid input(s): PROCALCITONIN, [place]  Microbiology: Recent Results (from the past 240 hour(s))  Respiratory Panel by RT PCR (Flu A&B, Covid) - Nasopharyngeal Swab     Status: Abnormal   Collection Time: 01/31/20  8:11 PM   Specimen: Nasopharyngeal Swab  Result Value Ref Range Status   SARS Coronavirus 2 by RT PCR POSITIVE (A) NEGATIVE Final    Comment: CRITICAL RESULT CALLED  TO, READ BACK BY AND VERIFIED WITH: [place] [address] 01/31/20 [place] A (NOTE) SARS-CoV-2 target nucleic acids are DETECTED.  SARS-CoV-2 RNA is generally detectable in upper respiratory specimens  during the acute phase of infection. Positive results are indicative of the presence of the identified virus, but do not rule out bacterial infection or co-infection with other pathogens not detected by the test. Clinical correlation with patient history and other diagnostic information is necessary to determine patient infection status. The expected result is Negative.  Fact Sheet for Patients:  PinkCheek.be  Fact Sheet for Healthcare Providers: GravelBags.it  This test is not yet approved or cleared by the [place] FDA and  has been authorized for detection and/or diagnosis of SARS-CoV-2 by FDA under an Emergency Use Authorization (EUA).  This EUA will remain in effect (meaning thi s test can be used) for the duration of  the COVID-19 declaration under Section 564(b)(1) of the Act, 21 U.S.C. section 360bbb-3(b)(1), unless the authorization is terminated or revoked sooner.      Influenza A by PCR NEGATIVE NEGATIVE Final   Influenza B by PCR NEGATIVE NEGATIVE Final    Comment: (NOTE) The Xpert Xpress SARS-CoV-2/FLU/RSV assay is intended as an aid in  the diagnosis of influenza from Nasopharyngeal swab specimens and  should not be used as a sole basis for treatment. Nasal washings and  aspirates are unacceptable for Xpert Xpress SARS-CoV-2/FLU/RSV  testing.  Fact Sheet for Patients: PinkCheek.be  Fact Sheet for Healthcare Providers: GravelBags.it  This test is not yet approved or cleared by the [place] FDA and  has been authorized for detection and/or diagnosis of SARS-CoV-2 by  FDA under an Emergency Use Authorization (EUA). This EUA will remain  in  effect (meaning this test can be used) for the duration of the  Covid-19 declaration under Section 564(b)(1) of the Act, 21  U.S.C. section 360bbb-3(b)(1), unless the authorization is  terminated or revoked. Performed at [facility], [place] [address] [place], [place] [address]   MRSA PCR Screening     Status: None   Collection Time: 02/01/20  4:49 AM   Specimen: Nasopharyngeal  Result  Value Ref Range Status   MRSA by PCR NEGATIVE NEGATIVE Final    Comment:        The GeneXpert MRSA Assay (FDA approved for NASAL specimens only), is one component of a comprehensive MRSA colonization surveillance program. It is not intended to diagnose MRSA infection nor to guide or monitor treatment for MRSA infections. Performed at [facility], [place] [address] [place], [place] [address]     Radiology Studies: No results found.   Nikya Busler T. [place]  If 7PM-7AM, please contact night-coverage [email or web address] 02/02/2020, 4:17 PM

## 2020-02-02 NOTE — Progress Notes (Signed)
Occupational Therapy Evaluation  Patient does not require any physical assistance for self care or functional transfers. Patient requiring increased time and effort standing from lower surfaces however able to self manage. Patient does report feeling low energy, educate patient on importance of mobility to build back endurance, pt verbalize understanding. Also patient provided with incentive spirometer with education to use regularly throughout the day. No further acute OT needs at this time, will discontinue. Please re-consult if new needs arise.    02/02/20 1400  OT Visit Information  Last OT Received On 02/02/20  Assistance Needed +1  History of Present Illness David Sharp is a 74 y.o. male with a past medical history of diabetes mellitus type 2, essential hypertension with history of  COVID-19 on 01/17/20, status post  monoclonal antibody treatment on 10/21 was subsequently noted to have poor oral intake nausea vomiting.  He did not have any respiratory symptoms. HH RN noted high glucose and was sent to Ed. In the emergency department he was found to have potassium of 5.6, glucose of 543.     WBC was mildly elevated.Patient decveloped  atrial fibrillation on 02/01/2020.  Precautions  Precaution Comments monitor VS, has been in Afib  Restrictions  Weight Bearing Restrictions No  Home Living  Family/patient expects to be discharged to: Private residence  Living Arrangements Spouse/significant other  Available Help at Discharge Family  Type of [place] to enter  Entrance Stairs-Number of Steps 3  [place] One level  Bathroom Shower/Tub [place] bars - tub/shower;BSC;Walker - 2 wheels  Prior Function  Level of Independence Independent  Communication  Communication No difficulties  Pain Assessment  Pain Assessment Faces  Faces Pain Scale 4  Pain Location abdomen  Pain Descriptors / Indicators Discomfort;Grimacing  Pain  Intervention(s) Monitored during session  Cognition  Arousal/Alertness Awake/alert  Behavior During Therapy Flat affect  Overall Cognitive Status Within Functional Limits for tasks assessed  General Comments keeping head down, minimal interaction, answers  with brief answers  Upper Extremity Assessment  Upper Extremity Assessment Overall WFL for tasks assessed  Lower Extremity Assessment  Lower Extremity Assessment Defer to PT evaluation  ADL  Overall ADL's  At baseline  General ADL Comments patient demonstrates ability to walk into bathroom, stand at sink to perform oral care without physical assistance  Bed Mobility  Overal bed mobility Modified Independent  Transfers  Overall transfer level Modified independent  General transfer comment increased effort due to low surfaces however did not require physical assistance  Balance  Overall balance assessment Mild deficits observed, not formally tested  General Comments  General comments (skin integrity, edema, etc.) VSS on room air  OT - End of Session  Activity Tolerance Patient tolerated treatment well  Patient left in chair;with call bell/phone within reach;with chair alarm set  Nurse Communication Mobility status;Other (comment) (IV bleeding)  OT Assessment  OT Recommendation/Assessment Patient does not need any further OT services  OT Visit Diagnosis Other abnormalities of gait and mobility (R26.89)  OT Problem List Decreased activity tolerance  AM-PAC OT "6 Clicks" Daily Activity Outcome Measure (Version 2)  Help from another person eating meals? 4  Help from another person taking care of personal grooming? 4  Help from another person toileting, which includes using toliet, bedpan, or urinal? 4  Help from another person bathing (including washing, rinsing, drying)? 4  Help from another person to put on and taking off regular upper body clothing?  4  Help from another person to put on and taking off regular lower body clothing?  4  6 Click Score 24  OT Recommendation  Follow Up Recommendations No OT follow up  OT Equipment None recommended by OT  Acute Rehab OT Goals  Patient Stated Goal go home, feel better  OT Goal Formulation With patient  OT Time Calculation  OT Start Time (ACUTE ONLY) 0937  OT Stop Time (ACUTE ONLY) 1023  OT Time Calculation (min) 46 min  OT General Charges  $OT Visit 1 Visit  OT Evaluation  $OT Eval Moderate Complexity 1 Mod  Written Expression  Dominant Hand Right   Delbert Phenix OT OT pager: [phone]

## 2020-02-02 NOTE — Evaluation (Signed)
Physical Therapy Evaluation Patient Details Name: David Sharp MRN: [id number] DOB: 10/05/1945 Today's Date: 02/02/2020   History of Present Illness  David Sharp is a 74 y.o. male with a past medical history of diabetes mellitus type 2, essential hypertension with history of  COVID-19 on 01/17/20, status post  monoclonal antibody treatment on 10/21 was subsequently noted to have poor oral intake nausea vomiting.  He did not have any respiratory symptoms. HH RN noted high glucose and was sent to Ed. In the emergency department he was found to have potassium of 5.6, glucose of 543.     WBC was mildly elevated.Patient decveloped  atrial fibrillation on 02/01/2020.  Clinical Impression  The patient resting in bed, very flat affect, minimal interaction. Patient mobil;ized and ambulated in room on RA, SPO2 > 93%, HR 81. Patient encouraged to sit up, use IS and try to eat. Patient should progress to Dc home. Wife at home, Positive for Covid.  Pt admitted with above diagnosis. Pt currently with functional limitations due to the deficits listed below (see PT Problem List). Pt will benefit from skilled PT to increase their independence and safety with mobility to allow discharge to the venue listed below.       Follow Up Recommendations No PT follow up    Equipment Recommendations  None recommended by PT    Recommendations for Other Services       Precautions / Restrictions Precautions Precaution Comments: monitor VS, has been in Afib      Mobility  Bed Mobility Overal bed mobility: Modified Independent                  Transfers Overall transfer level: Needs assistance   Transfers: Sit to/from Stand Sit to Stand: Min guard         General transfer comment: extra effort from low bed and low toilet-used rail  Ambulation/Gait Ambulation/Gait assistance: Min guard Gait Distance (Feet): 20 Feet (x2) Assistive device: None;IV Pole Gait Pattern/deviations: Trunk flexed Gait  velocity: decreased   General Gait Details: intermittent Hand support on pole  Stairs            Wheelchair Mobility    Modified Rankin (Stroke Patients Only)       Balance                                             Pertinent Vitals/Pain Pain Assessment: Faces Faces Pain Scale: Hurts little more Pain Location: abdomen Pain Descriptors / Indicators: Discomfort;Grimacing Pain Intervention(s): Monitored during session    Home Living Family/patient expects to be discharged to:: Private residence Living Arrangements: Spouse/significant other Available Help at Discharge: Family Type of Home: House Home Access: Stairs to enter   Technical brewer of Steps: 3 Home Layout: One level Home Equipment: Grab bars - tub/shower;Bedside commode;Walker - 2 wheels      Prior Function Level of Independence: Independent               Hand Dominance   Dominant Hand: Right    Extremity/Trunk Assessment   Upper Extremity Assessment Upper Extremity Assessment: Generalized weakness    Lower Extremity Assessment Lower Extremity Assessment: Generalized weakness    Cervical / Trunk Assessment Cervical / Trunk Assessment: Normal;Other exceptions Cervical / Trunk Exceptions: does keep head flexed, shoulders hunched  Communication   Communication: No difficulties  Cognition Arousal/Alertness: Awake/alert  Behavior During Therapy: [facility] for tasks assessed/performed;Flat affect Overall Cognitive Status: Within Functional Limits for tasks assessed                                 General Comments: keeping head down, minimal interaction, answers  with brief answers      General Comments      Exercises Other Exercises Other Exercises: IS x 5   Assessment/Plan    PT Assessment Patient needs continued PT services  PT Problem List Decreased strength;Decreased activity tolerance;Decreased mobility;Cardiopulmonary status limiting  activity       PT Treatment Interventions Gait training;Functional mobility training;Therapeutic activities;Therapeutic exercise;Patient/family education    PT Goals (Current goals can be found in the Care Plan section)  Acute Rehab PT Goals Patient Stated Goal: go home, feel better PT Goal Formulation: With patient Time For Goal Achievement: 02/16/20 Potential to Achieve Goals: Good    Frequency Min 3X/week   Barriers to discharge        Co-evaluation               AM-PAC PT "6 Clicks" Mobility  Outcome Measure Help needed turning from your back to your side while in a flat bed without using bedrails?: None Help needed moving from lying on your back to sitting on the side of a flat bed without using bedrails?: None Help needed moving to and from a bed to a chair (including a wheelchair)?: A Little Help needed standing up from a chair using your arms (e.g., wheelchair or bedside chair)?: A Little Help needed to walk in hospital room?: A Little Help needed climbing 3-5 steps with a railing? : A Little 6 Click Score: 20    End of Session   Activity Tolerance: Patient limited by fatigue Patient left: in chair;with call bell/phone within reach Nurse Communication: Mobility status (IV out of left hand and bleeding) PT Visit Diagnosis: Difficulty in walking, not elsewhere classified (R26.2)    Time: 4782-9562 PT Time Calculation (min) (ACUTE ONLY): 53 min   Charges:   PT Evaluation $PT Eval Low Complexity: 1 Low PT Treatments $Gait Training: 8-22 mins        [place] Pager [phone] Office [phone]   Claretha Cooper 02/02/2020, 11:47 AM

## 2020-02-03 ENCOUNTER — Inpatient Hospital Stay (HOSPITAL_COMMUNITY): Payer: PPO

## 2020-02-03 LAB — RENAL FUNCTION PANEL
Albumin: 2.7 g/dL — ABNORMAL LOW (ref 3.5–5.0)
Anion gap: 9 (ref 5–15)
BUN: 31 mg/dL — ABNORMAL HIGH (ref 8–23)
CO2: 19 mmol/L — ABNORMAL LOW (ref 22–32)
Calcium: 8.3 mg/dL — ABNORMAL LOW (ref 8.9–10.3)
Chloride: 105 mmol/L (ref 98–111)
Creatinine, Ser: 1.29 mg/dL — ABNORMAL HIGH (ref 0.61–1.24)
GFR, Estimated: 59 mL/min — ABNORMAL LOW (ref >60)
Glucose, Bld: 131 mg/dL — ABNORMAL HIGH (ref 70–99)
Phosphorus: 2.4 mg/dL — ABNORMAL LOW (ref 2.5–4.6)
Potassium: 4.2 mmol/L (ref 3.5–5.1)
Sodium: 133 mmol/L — ABNORMAL LOW (ref 135–145)

## 2020-02-03 LAB — CBC WITH DIFFERENTIAL/PLATELET
Abs Immature Granulocytes: 0.07 10*3/uL (ref 0.00–0.07)
Basophils Absolute: 0 10*3/uL (ref 0.0–0.1)
Basophils Relative: 0 %
Eosinophils Absolute: 0.1 10*3/uL (ref 0.0–0.5)
Eosinophils Relative: 1 %
HCT: 34.9 % — ABNORMAL LOW (ref 39.0–52.0)
Hemoglobin: 11.9 g/dL — ABNORMAL LOW (ref 13.0–17.0)
Immature Granulocytes: 1 %
Lymphocytes Relative: 7 %
Lymphs Abs: 1 10*3/uL (ref 0.7–4.0)
MCH: 30.5 pg (ref 26.0–34.0)
MCHC: 34.1 g/dL (ref 30.0–36.0)
MCV: 89.5 fL (ref 80.0–100.0)
Monocytes Absolute: 1.2 10*3/uL — ABNORMAL HIGH (ref 0.1–1.0)
Monocytes Relative: 9 %
Neutro Abs: 11.1 10*3/uL — ABNORMAL HIGH (ref 1.7–7.7)
Neutrophils Relative %: 82 %
Platelets: 194 10*3/uL (ref 150–400)
RBC: 3.9 MIL/uL — ABNORMAL LOW (ref 4.22–5.81)
RDW: 12.9 % (ref 11.5–15.5)
WBC: 13.5 10*3/uL — ABNORMAL HIGH (ref 4.0–10.5)
nRBC: 0 % (ref 0.0–0.2)

## 2020-02-03 LAB — GLUCOSE, CAPILLARY
Glucose-Capillary: 139 mg/dL — ABNORMAL HIGH (ref 70–99)
Glucose-Capillary: 186 mg/dL — ABNORMAL HIGH (ref 70–99)

## 2020-02-03 LAB — MAGNESIUM: Magnesium: 1.9 mg/dL (ref 1.7–2.4)

## 2020-02-03 MED ORDER — BLOOD GLUCOSE MONITOR KIT
PACK | 0 refills | Status: DC
Start: 2020-02-03 — End: 2022-04-18

## 2020-02-03 MED ORDER — ATORVASTATIN CALCIUM 20 MG PO TABS: 20.0000 mg | ORAL_TABLET | Freq: Every day | ORAL | 1 refills | Status: DC

## 2020-02-03 MED ORDER — DILTIAZEM HCL ER COATED BEADS 180 MG PO CP24
180.0000 mg | ORAL_CAPSULE | Freq: Every day | ORAL | 1 refills | Status: DC
Start: 2020-02-03 — End: 2022-04-27

## 2020-02-03 MED ORDER — LANTUS SOLOSTAR 100 UNIT/ML ~~LOC~~ SOPN: 15.0000 [IU] | PEN_INJECTOR | Freq: Two times a day (BID) | SUBCUTANEOUS | 11 refills | Status: AC

## 2020-02-03 MED ORDER — PEN NEEDLES 31G X 6 MM MISC: 1 refills | Status: AC

## 2020-02-03 MED ORDER — BLOOD GLUCOSE MONITOR KIT: PACK | 0 refills | Status: DC

## 2020-02-03 MED ORDER — METFORMIN HCL 1000 MG PO TABS: 1000.0000 mg | ORAL_TABLET | Freq: Two times a day (BID) | ORAL | 1 refills | Status: DC

## 2020-02-03 MED ORDER — APIXABAN 5 MG PO TABS
5.0000 mg | ORAL_TABLET | Freq: Two times a day (BID) | ORAL | 1 refills | Status: DC
Start: 2020-02-03 — End: 2022-04-17

## 2020-02-03 NOTE — Discharge Summary (Signed)
Physician Discharge Summary  David Sharp KDX:[id number] DOB: 1945/05/18 DOA: 01/31/2020  PCP: Patient, No Pcp Per  Admit date: 01/31/2020 Discharge date: 02/03/2020  Admitted From: Home Disposition: Home  Recommendations for Outpatient Follow-up:  1. Follow ups as below. 2. Please obtain CBC/BMP/Mag at follow up 3. Please follow up on the following pending results: Renal ultrasound  Home Health: None Equipment/Devices: None  Discharge Condition: Stable CODE STATUS: Full code  Hospital Course: 74 year old male with history of DM-2, HTN, recent COVID-19 diagnosis on 01/17/2020 s/p MAB infusion on 10/21 presenting with poor oral intake, nausea and vomiting, and admitted for HHS, new paroxysmal A. fib with RVR and AKI.  He was started on IV insulin and Cardizem drip.  Eventually transitioned to subcu insulin and p.o. Cardizem.  Did not have respiratory distress or oxygen requirement.   On the day of discharge, remained in sinus rhythm. Discharged on p.o. Cardizem CD 180 mg daily and Eliquis for anticoagulation.  In regard to his diabetes, blood glucose improved.  Discharged on Lantus 15 units twice daily and Metformin 1000 mg twice daily.  Also increased atorvastatin to 20 mg daily.  We stopped his lisinopril in the setting of AKI.  We have also stopped his amlodipine.  Recheck renal function, blood pressure and blood glucose at follow-up.  See individual problem list below for more hospital course. Discharge Diagnoses:  Uncontrolled DM-2 with hyperglycemia: Did not meet criteria for HHS or DKA.  A1c 11.3%.  Initially treated with IV insulin and transitioned to subcu insulin. Recent Labs  Lab 02/02/20 1722 02/02/20 2038 02/02/20 2243 02/03/20 0745 02/03/20 1132  GLUCAP 201* 130* 115* 139* 186*  -Discharged on Lantus 15 units twice daily, Metformin 1000 mg twice daily and atorvastatin 20 mg daily -Resume PTA ACE inhibitor's or ARB at follow-up if renal function improves.  New  onset paroxysmal A. fib with RVR: RVR resolved.  Converted to sinus rhythm. CHA2DS2-VASc score greater than 3.  No history of significant bleeding.  TSH within normal.  TTE basically normal.   -Discharged on Cardizem CD 180 mg daily and Eliquis 5 mg twice daily  COVID-19 infection: Mild disease. S/p MAB on 10/21.  No respiratory symptoms. -Supportive care  AKI/azotemia: Likely prerenal from poor p.o. intake and emesis.  Also on ACE inhibitors which could contribute. Cr 1.49 (admit)>>> 1.67> 1.29.  BUN 50> 31.   Renal ultrasound pending at the time of discharge. -Discontinue lisinopril -Recheck renal function at follow-up  Nausea and emesis:  Resolved.  Could be due to Covid or poorly controlled diabetes  Hyponatremia: Corrects to normal for hyperglycemia.  Hyperkalemia: Due to AKI?  Resolved.  Leukocytosis: WBC 23>> 13.5.  Unclear source of this. No respiratory or UTI symptoms.  Denies recent steroid.  No significant fever other than a mild temp to 99.6.  UA not suggestive for UTI.  CXR is negative.  It is reassuring that it has improved without antibiotics. -Recheck at follow-up  Essential hypertension: SBP slightly elevated.  Was on amlodipine and lisinopril. -Discontinue lisinopril in the setting of AKI -Discontinued amlodipine after starting Cardizem CD for A. fib.  History of BPH without LUTS: Not on meds.  Debility/generalized weakness: Evaluated by therapy and no need was identified.  Class I obesity Body mass index is 32.98 kg/m.            Discharge Exam: Vitals:   02/02/20 2035 02/03/20 0418  BP: (!) 150/73 (!) 160/69  Pulse: 74 72  Resp: (!) 21 17  Temp: 98.9 F (37.2 C) 99.4 F (37.4 C)  SpO2: 95% 95%    GENERAL: No apparent distress.  Nontoxic. HEENT: MMM.  Vision and hearing grossly intact.  NECK: Supple.  No apparent JVD.  RESP:  No IWOB.  Fair aeration bilaterally. CVS:  RRR. Heart sounds normal.  ABD/GI/GU: Bowel sounds present. Soft.  Non tender.  MSK/EXT:  Moves extremities. No apparent deformity. No edema.  SKIN: no apparent skin lesion or wound NEURO: Awake, alert and oriented appropriately.  No apparent focal neuro deficit. PSYCH: Calm. Normal affect.  Discharge Instructions  Discharge Instructions    Call MD for:  extreme fatigue   Complete by: As directed    Call MD for:  persistant dizziness or light-headedness   Complete by: As directed    Call MD for:  persistant nausea and vomiting   Complete by: As directed    Call MD for:  temperature >100.4   Complete by: As directed    Diet - low sodium heart healthy   Complete by: As directed    Diet Carb Modified   Complete by: As directed    Discharge instructions   Complete by: As directed    It has been a pleasure taking care of you!  You were hospitalized due to nausea and not feeling well likely from uncontrolled diabetes and atrial fibrillation. You were treated for both conditions and your symptoms improved. We have started you on insulin for diabetes and increased your Metformin dose. We have also started you on medications for atrial fibrillation. It is very important that you avoid any over-the-counter pain medication other than plain Tylenol while taking Eliquis (apixaban) which is a blood thinner to reduce your risk of having a stroke from atrial fibrillation.   Please review your new medication list and the directions on your medications before you take them.  Follow-up with your primary care doctor in 1 to 2 weeks.   Take care,  Managing your diabetes:  Your A1c is 11.2%. Your goal A1c is less than 7.0%. See below for more information about A1c.  Check your blood glucose 2-3 times a day before meals and keep blood glucose log. Inject 15 units of Lantus twice daily (in the morning and at bedtime). Take your Metformin twice daily as prescribed. Eat regular meals (breakfast, lunch and dinner) around-the-clock. You may snack as needed. See below  about few tips and recommendations on diet and exercise.  Watch for symptoms of low blood sugar (hypoglycemia). Read below about these symptoms and management. Please follow-up with your primary care doctor in 1 to 2 weeks.  Take your medications and blood glucose log to your follow-up. You also need an eye exam as soon as possible.  Please find an eye doctor and schedule an appointment as soon as possible. Do not walk barefoot.  Wear appropriate shoe with good cushion . Check your feet for any skin break or wound daily.  What is A1c:  The A1C test result reflects your average blood sugar level for the past two to three months. Specifically, the A1C test measures what percentage of your hemoglobin - a protein in red blood cells that carries oxygen - is coated with sugar (glycated). The higher your A1C level, the poorer your blood sugar control and the higher your risk of diabetes complications. Portion Size ?  Choose healthier foods such as 100% whole grains, vegetables, fruits, beans, nut seeds, olive oil, most vegetable oils, fat-free dietary, wild game and fish.  Avoid sweet tea, other sweetened beverages, soda, fruit juice, cold cereal and milk and trans fat.  Eat at least 3 meals and 1-2 snacks per day.  Aim for no more than 5 hours between eating.  Eat breakfast within one hour of getting up.   Exercise at least 150 minutes per week, including weight resistance exercises 3 or 4 times per week.  Try to lose at least 7-10% of your current body weight.   Hypoglycemia Hypoglycemia is when the sugar (glucose) level in the blood is too low. Symptoms of low blood sugar may include: Feeling: Hungry. Worried or nervous (anxious). Sweaty and clammy. Confused. Dizzy. Sleepy. Sick to your stomach (nauseous). Having: A fast heartbeat. A headache. A change in your vision. Jerky movements that you cannot control (seizure). Nightmares. Tingling or no feeling (numbness) around the mouth,  lips, or tongue. Having trouble with: Talking. Paying attention (concentrating). Moving (coordination). Sleeping. Shaking. Passing out (fainting). Getting upset easily (irritability).  Low blood sugar can happen to people who have diabetes and people who do not have diabetes. Low blood sugar can happen quickly, and it can be an emergency. Treating Low Blood Sugar Low blood sugar is often treated by eating or drinking something sugary right away. If you can think clearly and swallow safely, follow the 15:15 rule: Take 15 grams of a fast-acting carb (carbohydrate). Some fast-acting carbs are: 1 tube of glucose gel. 3 sugar tablets (glucose pills). 6-8 pieces of hard candy. 4 oz (120 mL) of fruit juice. 4 oz (120 mL) of regular (not diet) soda. Check your blood sugar 15 minutes after you take the carb. If your blood sugar is still at or below 70 mg/dL (3.9 mmol/L), take 15 grams of a carb again. If your blood sugar does not go above 70 mg/dL (3.9 mmol/L) after 3 tries, get help right away. After your blood sugar goes back to normal, eat a meal or a snack within 1 hour.   Treating Very Low Blood Sugar If your blood sugar is at or below 54 mg/dL (3 mmol/L), you have very low blood sugar (severe hypoglycemia). This is an emergency. Do not wait to see if the symptoms will go away. Get medical help right away. Call your local emergency services (911 in the [place] Do not drive yourself to the hospital. If you have very low blood sugar and you cannot eat or drink, you may need a glucagon shot (injection). A family member or friend should learn how to check your blood sugar and how to give you a glucagon shot. Ask your doctor if you need to have a glucagon shot kit at home. Follow these instructions at home: General instructions Avoid any diets that cause you to not eat enough food. Talk with your doctor before you start any new diet. Take over-the-counter and prescription medicines only as told  by your doctor. Limit alcohol to no more than 1 drink per day for nonpregnant women and 2 drinks per day for men. One drink equals 12 oz of beer, 5 oz of wine, or 1 oz of hard liquor. Keep all follow-up visits as told by your doctor. This is important. If You Have Diabetes: ? Make sure you know the symptoms of low blood sugar. Always keep a source of sugar with you, such as: Sugar. Sugar tablets. Glucose gel. Fruit juice. Regular soda (not diet soda). Milk. Hard candy. Honey. Take your medicines as told. Follow your exercise and meal plan. Eat on time. Do not  skip meals. Follow your sick day plan when you cannot eat or drink normally. Make this plan ahead of time with your doctor. Check your blood sugar as often as told by your doctor. Always check before and after exercise. Share your diabetes care plan with: Your work or school. People you live with. Check your pee (urine) for ketones: When you are sick. As told by your doctor. Carry a card or wear jewelry that says you have diabetes. If You Have Low Blood Sugar From Other Causes: ? Check your blood sugar as often as told by your doctor. Follow instructions from your doctor about what you cannot eat or drink. Contact a doctor if: You have trouble keeping your blood sugar in your target range. You have low blood sugar often. Get help right away if: You still have symptoms after you eat or drink something sugary. Your blood sugar is at or below 54 mg/dL (3 mmol/L). You have jerky movements that you cannot control.   Increase activity slowly   Complete by: As directed      Allergies as of 02/03/2020      Reactions   Amoxicillin Hives      Medication List    STOP taking these medications   amLODipine 5 MG tablet Commonly known as: NORVASC   ciprofloxacin 500 MG tablet Commonly known as: CIPRO   glyBURIDE-metformin 2.5-500 MG tablet Commonly known as: GLUCOVANCE   HYDROcodone-acetaminophen 10-325 MG  tablet Commonly known as: NORCO   lidocaine 5 % Commonly known as: Lidoderm   lisinopril 40 MG tablet Commonly known as: ZESTRIL   methylPREDNISolone 4 MG Tbpk tablet Commonly known as: MEDROL DOSEPAK   naproxen 375 MG tablet Commonly known as: NAPROSYN   phenazopyridine 200 MG tablet Commonly known as: Pyridium   traMADol 50 MG tablet Commonly known as: ULTRAM     TAKE these medications   acetaminophen 500 MG tablet Commonly known as: TYLENOL Take 1,000 mg by mouth every 6 (six) hours as needed for moderate pain.   apixaban 5 MG Tabs tablet Commonly known as: ELIQUIS Take 1 tablet (5 mg total) by mouth 2 (two) times daily.   aspirin EC 81 MG tablet Take 81 mg by mouth daily.   atorvastatin 20 MG tablet Commonly known as: Lipitor Take 1 tablet (20 mg total) by mouth daily. What changed:   medication strength  how much to take  when to take this   blood glucose meter kit and supplies Kit Use to check your blood glucose two to three times a day. Dispense based on patient and insurance preference. Use up to four times daily as directed. (FOR ICD-9 250.00, 250.01).   diltiazem 180 MG 24 hr capsule Commonly known as: CARDIZEM CD Take 1 capsule (180 mg total) by mouth daily.   Fish Oil 1200 MG Caps Take 1 capsule by mouth in the morning, at noon, and at bedtime.   Lantus SoloStar 100 UNIT/ML Solostar Pen Generic drug: insulin glargine Inject 15 Units into the skin 2 (two) times daily.   metFORMIN 1000 MG tablet Commonly known as: Glucophage Take 1 tablet (1,000 mg total) by mouth 2 (two) times daily with a meal.   multivitamin tablet Take 1 tablet by mouth daily.   Pen Needles 31G X 6 MM Misc Use to inject your insulin twice a day   vitamin C 1000 MG tablet Take 1,000 mg by mouth daily.   Vitamin D 125 MCG (5000 UT) Caps Take 5,000 Units by mouth  daily.   zinc gluconate 50 MG tablet Take 50 mg by mouth daily.        Consultations:  None  Procedures/Studies:  2D Echo on 02/02/2020 1. Left ventricular ejection fraction, by estimation, is 65 to 70%. The  left ventricle has normal function. The left ventricle has no regional  wall motion abnormalities. There is mild left ventricular hypertrophy.  Left ventricular diastolic parameters  were normal.  2. Right ventricular systolic function is normal. The right ventricular  size is normal.  3. No evidence of mitral valve regurgitation.  4. Mild aortic valve sclerosis is present, with no evidence of aortic  valve stenosis.    DG Chest 2 View  Result Date: 01/31/2020 CLINICAL DATA:  Weakness EXAM: CHEST - 2 VIEW COMPARISON:  None. FINDINGS: Cardiac shadows within normal limits. Patchy atelectatic changes are noted in the bases bilaterally. No focal confluent infiltrate is seen. No sizable effusion is noted. No acute bony abnormality is seen. IMPRESSION: Patchy bibasilar atelectasis. Electronically Signed   By: Inez Catalina M.D.   On: 01/31/2020 18:59   ECHOCARDIOGRAM COMPLETE  Result Date: 02/02/2020    ECHOCARDIOGRAM REPORT   Patient Name:   OBBIE LEWALLEN Date of Exam: 02/02/2020 Medical Rec #:  [id number]    Height:       73.0 in Accession #:    [id number]   Weight:       250.0 lb Date of Birth:  [birth year]     BSA:          2.366 m Patient Age:    6 years     BP:           167/66 mmHg Patient Gender: M            HR:           65 bpm. Exam Location:  Inpatient Procedure: 2D Echo Indications:    Atrial Fibrillation 427.31 / I48.91  History:        Patient has no prior history of Echocardiogram examinations.                 Risk Factors:Hypertension. COVID 19. Acute renal failure.                 History of paroxysmal atrial fibrillation.  Sonographer:    Darlina Sicilian RDCS Referring Phys: [id number] Charlesetta Ivory Shameka Aggarwal IMPRESSIONS  1. Left ventricular ejection fraction, by estimation, is 65 to 70%. The left ventricle has normal function. The left ventricle has no  regional wall motion abnormalities. There is mild left ventricular hypertrophy. Left ventricular diastolic parameters were normal.  2. Right ventricular systolic function is normal. The right ventricular size is normal.  3. No evidence of mitral valve regurgitation.  4. Mild aortic valve sclerosis is present, with no evidence of aortic valve stenosis. FINDINGS  Left Ventricle: Left ventricular ejection fraction, by estimation, is 65 to 70%. The left ventricle has normal function. The left ventricle has no regional wall motion abnormalities. The left ventricular internal cavity size was normal in size. There is  mild left ventricular hypertrophy. Left ventricular diastolic parameters were normal. Right Ventricle: The right ventricular size is normal. Right vetricular wall thickness was not assessed. Right ventricular systolic function is normal. Left Atrium: Left atrial size was normal in size. Right Atrium: Right atrial size was normal in size. Pericardium: There is no evidence of pericardial effusion. Mitral Valve: The mitral valve is abnormal. There is mild thickening of the mitral valve  leaflet(s). No evidence of mitral valve regurgitation. Tricuspid Valve: The tricuspid valve is normal in structure. Tricuspid valve regurgitation is trivial. Aortic Valve: The aortic valve is abnormal. Aortic valve regurgitation is not visualized. Mild aortic valve sclerosis is present, with no evidence of aortic valve stenosis. Pulmonic Valve: The pulmonic valve was normal in structure. Pulmonic valve regurgitation is not visualized. Aorta: The aortic root is normal in size and structure. IAS/Shunts: The interatrial septum was not assessed.  LEFT VENTRICLE PLAX 2D LVIDd:         4.90 cm  Diastology LVIDs:         3.00 cm  LV e' medial:    7.18 cm/s LV PW:         1.00 cm  LV E/e' medial:  7.0 LV IVS:        1.20 cm  LV e' lateral:   6.85 cm/s LVOT diam:     2.20 cm  LV E/e' lateral: 7.4 LV SV:         56 LV SV Index:   24 LVOT  Area:     3.80 cm  RIGHT VENTRICLE RV S prime:     12.30 cm/s TAPSE (M-mode): 2.0 cm LEFT ATRIUM             Index LA diam:        3.80 cm 1.61 cm/m LA Vol (A2C):   50.7 ml 21.43 ml/m LA Vol (A4C):   45.2 ml 19.11 ml/m LA Biplane Vol: 50.4 ml 21.30 ml/m  AORTIC VALVE LVOT Vmax:   84.90 cm/s LVOT Vmean:  64.000 cm/s LVOT VTI:    0.148 m  AORTA Ao Root diam: 3.70 cm Ao Asc diam:  3.80 cm MITRAL VALVE MV Area (PHT): 2.99 cm    SHUNTS MV Decel Time: 254 msec    Systemic VTI:  0.15 m MV E velocity: 50.60 cm/s  Systemic Diam: 2.20 cm MV A velocity: 64.30 cm/s MV E/A ratio:  0.79 Dorris Carnes MD Electronically signed by Dorris Carnes MD Signature Date/Time: 02/02/2020/7:48:50 PM    Final         The results of significant diagnostics from this hospitalization (including imaging, microbiology, ancillary and laboratory) are listed below for reference.     Microbiology: Recent Results (from the past 240 hour(s))  Respiratory Panel by RT PCR (Flu A&B, Covid) - Nasopharyngeal Swab     Status: Abnormal   Collection Time: 01/31/20  8:11 PM   Specimen: Nasopharyngeal Swab  Result Value Ref Range Status   SARS Coronavirus 2 by RT PCR POSITIVE (A) NEGATIVE Final    Comment: CRITICAL RESULT CALLED TO, READ BACK BY AND VERIFIED WITH: [place] [id number] 01/31/20 CRUICKSHANK A (NOTE) SARS-CoV-2 target nucleic acids are DETECTED.  SARS-CoV-2 RNA is generally detectable in upper respiratory specimens  during the acute phase of infection. Positive results are indicative of the presence of the identified virus, but do not rule out bacterial infection or co-infection with other pathogens not detected by the test. Clinical correlation with patient history and other diagnostic information is necessary to determine patient infection status. The expected result is Negative.  Fact Sheet for Patients:  PinkCheek.be  Fact Sheet for Healthcare  Providers: GravelBags.it  This test is not yet approved or cleared by the [place] FDA and  has been authorized for detection and/or diagnosis of SARS-CoV-2 by FDA under an Emergency Use Authorization (EUA).  This EUA will remain in effect (meaning thi s test can be  used) for the duration of  the COVID-19 declaration under Section 564(b)(1) of the Act, 21 U.S.C. section 360bbb-3(b)(1), unless the authorization is terminated or revoked sooner.      Influenza A by PCR NEGATIVE NEGATIVE Final   Influenza B by PCR NEGATIVE NEGATIVE Final    Comment: (NOTE) The Xpert Xpress SARS-CoV-2/FLU/RSV assay is intended as an aid in  the diagnosis of influenza from Nasopharyngeal swab specimens and  should not be used as a sole basis for treatment. Nasal washings and  aspirates are unacceptable for Xpert Xpress SARS-CoV-2/FLU/RSV  testing.  Fact Sheet for Patients: PinkCheek.be  Fact Sheet for Healthcare Providers: GravelBags.it  This test is not yet approved or cleared by the [place] FDA and  has been authorized for detection and/or diagnosis of SARS-CoV-2 by  FDA under an Emergency Use Authorization (EUA). This EUA will remain  in effect (meaning this test can be used) for the duration of the  Covid-19 declaration under Section 564(b)(1) of the Act, 21  U.S.C. section 360bbb-3(b)(1), unless the authorization is  terminated or revoked. Performed at [facility], [place] [address] [place], [place] [address]   MRSA PCR Screening     Status: None   Collection Time: 02/01/20  4:49 AM   Specimen: Nasopharyngeal  Result Value Ref Range Status   MRSA by PCR NEGATIVE NEGATIVE Final    Comment:        The GeneXpert MRSA Assay (FDA approved for NASAL specimens only), is one component of a comprehensive MRSA colonization surveillance program. It is not intended to diagnose  MRSA infection nor to guide or monitor treatment for MRSA infections. Performed at [facility], [place] [address], [place], [place] [address]      Labs: BNP (last 3 results) No results for input(s): BNP in the last 8760 hours. Basic Metabolic Panel: Recent Labs  Lab 01/31/20 1818 02/01/20 0610 02/01/20 1656 02/02/20 0354 02/03/20 0526  NA 134* 138 134* 136 133*  K 5.6* 4.4 4.6 4.5 4.2  CL 101 104 103 105 105  CO2 20* 22 21* 20* 19*  GLUCOSE 543* 207* 326* 211* 131*  BUN 53* 50* 44* 40* 31*  CREATININE 1.79* 1.48* 1.45* 1.67* 1.29*  CALCIUM 9.9 9.5 9.0 9.1 8.3*  MG  --   --   --  2.2 1.9  PHOS  --   --   --  2.6 2.4*   Liver Function Tests: Recent Labs  Lab 02/01/20 0610 02/02/20 0354 02/03/20 0526  AST 22 20  --   ALT 22 19  --   ALKPHOS 70 65  --   BILITOT 1.1 1.3*  --   PROT 6.9 6.5  --   ALBUMIN 3.6 3.4* 2.7*   No results for input(s): LIPASE, AMYLASE in the last 168 hours. No results for input(s): AMMONIA in the last 168 hours. CBC: Recent Labs  Lab 01/31/20 1818 02/01/20 0610 02/02/20 0354 02/03/20 0526  WBC 22.5* 23.1* 19.9* 13.5*  NEUTROABS  --   --   --  11.1*  HGB 14.1 13.7 13.7 11.9*  HCT 41.3 40.4 41.5 34.9*  MCV 91.4 91.4 92.2 89.5  PLT 287 261 250 194   Cardiac Enzymes: No results for input(s): CKTOTAL, CKMB, CKMBINDEX, TROPONINI in the last 168 hours. BNP: Invalid input(s): POCBNP CBG: Recent Labs  Lab 02/02/20 1722 02/02/20 2038 02/02/20 2243 02/03/20 0745 02/03/20 1132  GLUCAP 201* 130* 115* 139* 186*   D-Dimer No results for input(s): DDIMER  in the last 72 hours. Hgb A1c Recent Labs    02/01/20 0610  HGBA1C 11.3*   Lipid Profile No results for input(s): CHOL, HDL, LDLCALC, TRIG, CHOLHDL, LDLDIRECT in the last 72 hours. Thyroid function studies Recent Labs    02/01/20 1354  TSH 0.803   Anemia work up No results for input(s): VITAMINB12, FOLATE, FERRITIN, TIBC, IRON, RETICCTPCT in the last 72  hours. Urinalysis    Component Value Date/Time   COLORURINE STRAW (A) 01/31/2020 1818   APPEARANCEUR CLEAR 01/31/2020 1818   LABSPEC 1.025 01/31/2020 1818   PHURINE 5.0 01/31/2020 1818   GLUCOSEU >=500 (A) 01/31/2020 1818   HGBUR SMALL (A) 01/31/2020 1818   BILIRUBINUR NEGATIVE 01/31/2020 1818   KETONESUR NEGATIVE 01/31/2020 1818   PROTEINUR NEGATIVE 01/31/2020 1818   NITRITE NEGATIVE 01/31/2020 1818   LEUKOCYTESUR NEGATIVE 01/31/2020 1818   Sepsis Labs Invalid input(s): PROCALCITONIN,  WBC,  LACTICIDVEN   Time coordinating discharge: 40 minutes  SIGNED:  Mercy Riding, MD  [facility] 02/03/2020, 3:00 PM  If 7PM-7AM, please contact night-coverage [email or web address]

## 2020-02-03 NOTE — Progress Notes (Signed)
Patient discharged via wheelchair accompanied by staff. Pt. Is alert and oriented, not on any distress. AVS discussed and pt. Was properly instructed, pt. Verbalized understanding. Discharge paper given to pt. All personal belongings are with the pt.

## 2020-02-19 DIAGNOSIS — I10 Essential (primary) hypertension: Secondary | ICD-10-CM | POA: Diagnosis not present

## 2020-02-19 DIAGNOSIS — Z8616 Personal history of COVID-19: Secondary | ICD-10-CM | POA: Diagnosis not present

## 2020-02-19 DIAGNOSIS — Z7982 Long term (current) use of aspirin: Secondary | ICD-10-CM | POA: Diagnosis not present

## 2020-02-19 DIAGNOSIS — I480 Paroxysmal atrial fibrillation: Secondary | ICD-10-CM | POA: Diagnosis not present

## 2020-02-19 DIAGNOSIS — E871 Hypo-osmolality and hyponatremia: Secondary | ICD-10-CM | POA: Diagnosis not present

## 2020-02-19 DIAGNOSIS — E113553 Type 2 diabetes mellitus with stable proliferative diabetic retinopathy, bilateral: Secondary | ICD-10-CM | POA: Diagnosis not present

## 2020-02-19 DIAGNOSIS — Z79899 Other long term (current) drug therapy: Secondary | ICD-10-CM | POA: Diagnosis not present

## 2020-02-19 DIAGNOSIS — N401 Enlarged prostate with lower urinary tract symptoms: Secondary | ICD-10-CM | POA: Diagnosis not present

## 2020-02-19 DIAGNOSIS — E860 Dehydration: Secondary | ICD-10-CM | POA: Diagnosis not present

## 2020-02-19 DIAGNOSIS — N138 Other obstructive and reflux uropathy: Secondary | ICD-10-CM | POA: Diagnosis not present

## 2020-03-02 ENCOUNTER — Emergency Department (HOSPITAL_BASED_OUTPATIENT_CLINIC_OR_DEPARTMENT_OTHER)
Admission: EM | Admit: 2020-03-02 | Discharge: 2020-03-02 | Disposition: A | Discharge status: Home / Self Care | Payer: PPO | Attending: Emergency Medicine | Admitting: Emergency Medicine

## 2020-03-02 ENCOUNTER — Other Ambulatory Visit: Payer: Self-pay

## 2020-03-02 DIAGNOSIS — I129 Hypertensive chronic kidney disease with stage 1 through stage 4 chronic kidney disease, or unspecified chronic kidney disease: Secondary | ICD-10-CM | POA: Diagnosis not present

## 2020-03-02 DIAGNOSIS — E1122 Type 2 diabetes mellitus with diabetic chronic kidney disease: Secondary | ICD-10-CM | POA: Insufficient documentation

## 2020-03-02 DIAGNOSIS — L03115 Cellulitis of right lower limb: Secondary | ICD-10-CM | POA: Diagnosis not present

## 2020-03-02 DIAGNOSIS — N183 Chronic kidney disease, stage 3 unspecified: Secondary | ICD-10-CM | POA: Insufficient documentation

## 2020-03-02 DIAGNOSIS — R609 Edema, unspecified: Secondary | ICD-10-CM | POA: Diagnosis not present

## 2020-03-02 DIAGNOSIS — Z7984 Long term (current) use of oral hypoglycemic drugs: Secondary | ICD-10-CM | POA: Diagnosis not present

## 2020-03-02 DIAGNOSIS — N1831 Chronic kidney disease, stage 3a: Secondary | ICD-10-CM | POA: Diagnosis not present

## 2020-03-02 DIAGNOSIS — Z794 Long term (current) use of insulin: Secondary | ICD-10-CM | POA: Diagnosis not present

## 2020-03-02 DIAGNOSIS — Z7982 Long term (current) use of aspirin: Secondary | ICD-10-CM | POA: Diagnosis not present

## 2020-03-02 DIAGNOSIS — L03116 Cellulitis of left lower limb: Secondary | ICD-10-CM | POA: Diagnosis not present

## 2020-03-02 DIAGNOSIS — Z8616 Personal history of COVID-19: Secondary | ICD-10-CM | POA: Insufficient documentation

## 2020-03-02 DIAGNOSIS — R600 Localized edema: Secondary | ICD-10-CM | POA: Diagnosis not present

## 2020-03-02 DIAGNOSIS — M79605 Pain in left leg: Secondary | ICD-10-CM | POA: Diagnosis present

## 2020-03-02 LAB — CBC WITH DIFFERENTIAL/PLATELET
Abs Immature Granulocytes: 0.11 10*3/uL — ABNORMAL HIGH (ref 0.00–0.07)
Basophils Absolute: 0.1 10*3/uL (ref 0.0–0.1)
Basophils Relative: 1 %
Eosinophils Absolute: 0.2 10*3/uL (ref 0.0–0.5)
Eosinophils Relative: 1 %
HCT: 28.9 % — ABNORMAL LOW (ref 39.0–52.0)
Hemoglobin: 9.4 g/dL — ABNORMAL LOW (ref 13.0–17.0)
Immature Granulocytes: 1 %
Lymphocytes Relative: 13 %
Lymphs Abs: 1.4 10*3/uL (ref 0.7–4.0)
MCH: 29.9 pg (ref 26.0–34.0)
MCHC: 32.5 g/dL (ref 30.0–36.0)
MCV: 92 fL (ref 80.0–100.0)
Monocytes Absolute: 0.8 10*3/uL (ref 0.1–1.0)
Monocytes Relative: 7 %
Neutro Abs: 8.2 10*3/uL — ABNORMAL HIGH (ref 1.7–7.7)
Neutrophils Relative %: 77 %
Platelets: 408 10*3/uL — ABNORMAL HIGH (ref 150–400)
RBC: 3.14 MIL/uL — ABNORMAL LOW (ref 4.22–5.81)
RDW: 13.5 % (ref 11.5–15.5)
WBC: 10.6 10*3/uL — ABNORMAL HIGH (ref 4.0–10.5)
nRBC: 0 % (ref 0.0–0.2)

## 2020-03-02 LAB — BASIC METABOLIC PANEL
Anion gap: 10 (ref 5–15)
BUN: 15 mg/dL (ref 8–23)
CO2: 20 mmol/L — ABNORMAL LOW (ref 22–32)
Calcium: 8.8 mg/dL — ABNORMAL LOW (ref 8.9–10.3)
Chloride: 109 mmol/L (ref 98–111)
Creatinine, Ser: 1.32 mg/dL — ABNORMAL HIGH (ref 0.61–1.24)
GFR, Estimated: 57 mL/min — ABNORMAL LOW (ref >60)
Glucose, Bld: 65 mg/dL — ABNORMAL LOW (ref 70–99)
Potassium: 4.3 mmol/L (ref 3.5–5.1)
Sodium: 139 mmol/L (ref 135–145)

## 2020-03-02 MED ORDER — CEPHALEXIN 500 MG PO CAPS: 500.0000 mg | ORAL_CAPSULE | Freq: Four times a day (QID) | ORAL | 0 refills | Status: DC

## 2020-03-02 MED ORDER — FUROSEMIDE 10 MG/ML IJ SOLN
20.0000 mg | Freq: Once | INTRAMUSCULAR | Status: AC
  Administered 2020-03-02: 20 mg via INTRAVENOUS
  Filled 2020-03-02: qty 2

## 2020-03-02 MED ORDER — CEFAZOLIN SODIUM-DEXTROSE 1-4 GM/50ML-% IV SOLN
1.0000 g | Freq: Once | INTRAVENOUS | Status: AC
  Administered 2020-03-02: 1 g via INTRAVENOUS
  Filled 2020-03-02: qty 50

## 2020-03-02 MED ORDER — SODIUM CHLORIDE 0.9 % IV SOLN: INTRAVENOUS | Status: DC | PRN

## 2020-03-02 MED ORDER — FUROSEMIDE 20 MG PO TABS: 20.0000 mg | ORAL_TABLET | Freq: Every day | ORAL | 0 refills | Status: DC

## 2020-03-02 NOTE — ED Provider Notes (Signed)
David Sharp Provider Note   CSN: [id number] Arrival date & time: 03/02/20  1105     History Chief Complaint  Patient presents with  . Leg Pain    [name] is a 74 y.o. male.  Pt presents to the ED today with bilateral leg pain, swelling, and rash.  Pt was admitted to the hospital from 11/3-6 for dehydration and elevated BS.  Since d/c, his legs have been swollen.  He noticed a rash forming on both legs last week.  He denies any fevers.  He said his blood sugars have been good since d/c.  Pt has been compliant with his Eliquis that he's been taking for p. Afib.  CHA2DS2/VAS Stroke Risk Points  Current as of 21 minutes ago     3 >= 2 Points: High Risk  1 - 1.99 Points: Medium Risk  0 Points: Low Risk    No Change      Details    This score determines the patient's risk of having a stroke if the  patient has atrial fibrillation.       Points Metrics  0 Has Congestive Heart Failure:  No    Current as of 21 minutes ago  0 Has Vascular Disease:  No    Current as of 21 minutes ago  1 Has Hypertension:  Yes    Current as of 21 minutes ago  1 Age:  70    Current as of 21 minutes ago  1 Has Diabetes:  Yes    Current as of 21 minutes ago  0 Had Stroke:  No  Had TIA:  No  Had thromboembolism:  No    Current as of 21 minutes ago  0 Male:  No    Current as of 21 minutes ago                 Past Medical History:  Diagnosis Date  . Acute urinary retention   . Arthritis   . BPH (benign prostatic hyperplasia)   . Foley catheter in place   . GERD (gastroesophageal reflux disease)   . Hyperlipidemia   . Hypertension   . Type 2 diabetes mellitus ([place])   . Wears glasses     Patient Active Problem List   Diagnosis Date Noted  . ARF (acute renal failure) ([place]) 02/01/2020  . Hyponatremia 02/01/2020  . Hyperkalemia 02/01/2020  . Essential hypertension 02/01/2020  . COVID-19 virus infection 02/01/2020  . Hyperosmolar hyperglycemic  state (HHS) ([place]) 02/01/2020  . Hyperglycemia 01/31/2020  . BPH with urinary obstruction 01/27/2016    Past Surgical History:  Procedure Laterality Date  . CATARACT EXTRACTION W/ INTRAOCULAR LENS IMPLANT Right 1990's  . INGUINAL HERNIA REPAIR Right 2007  approx  . RETINAL DETACHMENT SURGERY Bilateral last one , left eye 1997/  right eye 1990's  . TRANSURETHRAL RESECTION OF PROSTATE N/A 01/27/2016   Procedure: TRANSURETHRAL RESECTION OF THE PROSTATE (TURP) WITH GYRUS;  Surgeon: Kathie Rhodes, MD;  Location: [facility];  Service: Urology;  Laterality: N/A;       No family history on file.  Social History   Tobacco Use  . Smoking status: Never Smoker  . Smokeless tobacco: Never Used  Vaping Use  . Vaping Use: Never used  Substance Use Topics  . Alcohol use: Yes    Comment: VERY RARE  . Drug use: No    Home Medications Prior to Admission medications   Medication Sig Start Date End  Date Taking? Authorizing Provider  acetaminophen (TYLENOL) 500 MG tablet Take 1,000 mg by mouth every 6 (six) hours as needed for moderate pain.    [provider]  apixaban (ELIQUIS) 5 MG TABS tablet Take 1 tablet (5 mg total) by mouth 2 (two) times daily. 02/03/20   Mercy Riding, MD  Ascorbic Acid (VITAMIN C) 1000 MG tablet Take 1,000 mg by mouth daily.    [provider]  aspirin EC 81 MG tablet Take 81 mg by mouth daily.    [provider]  atorvastatin (LIPITOR) 20 MG tablet Take 1 tablet (20 mg total) by mouth daily. 02/03/20 08/01/20  Mercy Riding, MD  blood glucose meter kit and supplies KIT Use to check your blood glucose two to three times a day. Dispense based on patient and insurance preference. Use up to four times daily as directed. (FOR ICD-9 250.00, 250.01). 02/03/20   Mercy Riding, MD  cephALEXin (KEFLEX) 500 MG capsule Take 1 capsule (500 mg total) by mouth 4 (four) times daily. 03/02/20   Isla Pence, MD  Cholecalciferol (VITAMIN D) 125 MCG  (5000 UT) CAPS Take 5,000 Units by mouth daily.    [provider]  diltiazem (CARDIZEM CD) 180 MG 24 hr capsule Take 1 capsule (180 mg total) by mouth daily. 02/03/20   Mercy Riding, MD  furosemide (LASIX) 20 MG tablet Take 1 tablet (20 mg total) by mouth daily. 03/02/20   Isla Pence, MD  insulin glargine (LANTUS SOLOSTAR) 100 UNIT/ML Solostar Pen Inject 15 Units into the skin 2 (two) times daily. 02/03/20   Mercy Riding, MD  Insulin Pen Needle (PEN NEEDLES) 31G X 6 MM MISC Use to inject your insulin twice a day 02/03/20   Mercy Riding, MD  metFORMIN (GLUCOPHAGE) 1000 MG tablet Take 1 tablet (1,000 mg total) by mouth 2 (two) times daily with a meal. 02/03/20 08/01/20  Mercy Riding, MD  Multiple Vitamin (MULTIVITAMIN) tablet Take 1 tablet by mouth daily.    [provider]  Omega-3 Fatty Acids (FISH OIL) 1200 MG CAPS Take 1 capsule by mouth in the morning, at noon, and at bedtime.     [provider]  zinc gluconate 50 MG tablet Take 50 mg by mouth daily.    [provider]    Allergies    Amoxicillin  Review of Systems   Review of Systems  Cardiovascular: Positive for leg swelling.  Skin: Positive for rash.  All other systems reviewed and are negative.   Physical Exam Updated Vital Signs BP (!) 144/70 (BP Location: Left Arm)   Pulse 62   Temp 98.4 F (36.9 C) (Oral)   Resp 18   Ht _0  (1.854 m)   Wt 115.2 kg   SpO2 99%   BMI 33.51 kg/m   Physical Exam Vitals and nursing note reviewed.  Constitutional:      Appearance: Normal appearance.  HENT:     Head: Normocephalic and atraumatic.     Right Ear: External ear normal.     Left Ear: External ear normal.     Nose: Nose normal.     Mouth/Throat:     Mouth: Mucous membranes are dry.  Eyes:     Extraocular Movements: Extraocular movements intact.     Conjunctiva/sclera: Conjunctivae normal.     Pupils: Pupils are equal, round, and reactive to light.  Cardiovascular:     Rate and  Rhythm: Normal rate and regular rhythm.  Pulses: Normal pulses.     Heart sounds: Normal heart sounds.  Pulmonary:     Effort: Pulmonary effort is normal.     Breath sounds: Normal breath sounds.  Abdominal:     General: Abdomen is flat. Bowel sounds are normal.     Palpations: Abdomen is soft.  Musculoskeletal:     Cervical back: Normal range of motion and neck supple.     Right lower leg: Edema present.     Left lower leg: Edema present.  Skin:    General: Skin is warm.     Capillary Refill: Capillary refill takes less than 2 seconds.     Comments: Cellulitis to BLE  Neurological:     General: No focal deficit present.     Mental Status: He is alert and oriented to person, place, and time.  Psychiatric:        Mood and Affect: Mood normal.        Behavior: Behavior normal.     ED Results / Procedures / Treatments   Labs (all labs ordered are listed, but only abnormal results are displayed) Labs Reviewed  BASIC METABOLIC PANEL - Abnormal; Notable for the following components:      Result Value   CO2 20 (*)    Glucose, Bld 65 (*)    Creatinine, Ser 1.32 (*)    Calcium 8.8 (*)    GFR, Estimated 57 (*)    All other components within normal limits  CBC WITH DIFFERENTIAL/PLATELET - Abnormal; Notable for the following components:   WBC 10.6 (*)    RBC 3.14 (*)    Hemoglobin 9.4 (*)    HCT 28.9 (*)    Platelets 408 (*)    Neutro Abs 8.2 (*)    Abs Immature Granulocytes 0.11 (*)    All other components within normal limits    EKG None  Radiology No results found.  Procedures Procedures (including critical care time)  Medications Ordered in ED Medications  ceFAZolin (ANCEF) IVPB 1 g/50 mL premix (1 g Intravenous New Bag/Given 03/02/20 1756)  0.9 %  sodium chloride infusion ( Intravenous New Bag/Given 03/02/20 1753)  furosemide (LASIX) injection 20 mg (20 mg Intravenous Given 03/02/20 1753)    ED Course  I have reviewed the triage vital signs and the nursing  notes.  Pertinent labs & imaging results that were available during my care of the patient were reviewed by me and considered in my medical decision making (see chart for details).    MDM Rules/Calculators/A&P                          Kidney function is stable.  He gets hives with amox, but was ok trying Ancef.  He had no reactions to the Ancef.  He is stable for d/c with keflex.  I am also going to start him on a 2 week low dose lasix to help with the edema.  He is also told to wear compression hose.  Return if worse.  F/u with pcp.   Final Clinical Impression(s) / ED Diagnoses Final diagnoses:  Cellulitis of left lower extremity  Cellulitis of right lower extremity  Peripheral edema  Stage 3a chronic kidney disease ([place])    Rx / DC Orders ED Discharge Orders         Ordered    cephALEXin (KEFLEX) 500 MG capsule  4 times daily        03/02/20 1822  furosemide (LASIX) 20 MG tablet  Daily        03/02/20 Elson Clan, MD 03/02/20 1825

## 2020-03-02 NOTE — ED Triage Notes (Signed)
Presents to the ED today with c/o bilateral leg pain, from knees to feet, has a rash on both legs as well. Swelling noted a few weeks ago, rash noted last week

## 2020-03-02 NOTE — ED Notes (Signed)
Attempted x 2 to establish IV, unsuccessful, will consult fellow RN

## 2020-03-02 NOTE — Discharge Instructions (Signed)
Wear compression hose to help the leg swelling.

## 2020-03-11 DIAGNOSIS — E860 Dehydration: Secondary | ICD-10-CM | POA: Diagnosis not present

## 2020-03-11 DIAGNOSIS — R7989 Other specified abnormal findings of blood chemistry: Secondary | ICD-10-CM | POA: Diagnosis not present

## 2020-03-11 DIAGNOSIS — R600 Localized edema: Secondary | ICD-10-CM | POA: Diagnosis not present

## 2020-03-11 DIAGNOSIS — L03119 Cellulitis of unspecified part of limb: Secondary | ICD-10-CM | POA: Diagnosis not present

## 2020-03-11 DIAGNOSIS — R944 Abnormal results of kidney function studies: Secondary | ICD-10-CM | POA: Diagnosis not present

## 2020-03-11 DIAGNOSIS — D649 Anemia, unspecified: Secondary | ICD-10-CM | POA: Diagnosis not present

## 2020-03-11 DIAGNOSIS — N1831 Chronic kidney disease, stage 3a: Secondary | ICD-10-CM | POA: Diagnosis not present

## 2020-03-12 DIAGNOSIS — E7800 Pure hypercholesterolemia, unspecified: Secondary | ICD-10-CM | POA: Diagnosis not present

## 2020-03-12 DIAGNOSIS — E113553 Type 2 diabetes mellitus with stable proliferative diabetic retinopathy, bilateral: Secondary | ICD-10-CM | POA: Diagnosis not present

## 2020-03-12 DIAGNOSIS — I10 Essential (primary) hypertension: Secondary | ICD-10-CM | POA: Diagnosis not present

## 2020-03-12 DIAGNOSIS — I480 Paroxysmal atrial fibrillation: Secondary | ICD-10-CM | POA: Diagnosis not present

## 2020-03-15 DIAGNOSIS — D649 Anemia, unspecified: Secondary | ICD-10-CM | POA: Diagnosis not present

## 2020-03-15 DIAGNOSIS — R600 Localized edema: Secondary | ICD-10-CM | POA: Diagnosis not present

## 2020-03-15 DIAGNOSIS — R944 Abnormal results of kidney function studies: Secondary | ICD-10-CM | POA: Diagnosis not present

## 2020-03-15 DIAGNOSIS — L03119 Cellulitis of unspecified part of limb: Secondary | ICD-10-CM | POA: Diagnosis not present

## 2020-03-19 DIAGNOSIS — E119 Type 2 diabetes mellitus without complications: Secondary | ICD-10-CM | POA: Diagnosis not present

## 2020-03-19 DIAGNOSIS — I4891 Unspecified atrial fibrillation: Secondary | ICD-10-CM | POA: Diagnosis not present

## 2020-03-19 DIAGNOSIS — Z794 Long term (current) use of insulin: Secondary | ICD-10-CM | POA: Diagnosis not present

## 2020-03-19 DIAGNOSIS — E1122 Type 2 diabetes mellitus with diabetic chronic kidney disease: Secondary | ICD-10-CM | POA: Diagnosis not present

## 2020-03-19 DIAGNOSIS — D649 Anemia, unspecified: Secondary | ICD-10-CM | POA: Diagnosis not present

## 2020-03-19 DIAGNOSIS — E113553 Type 2 diabetes mellitus with stable proliferative diabetic retinopathy, bilateral: Secondary | ICD-10-CM | POA: Diagnosis not present

## 2020-03-19 DIAGNOSIS — N281 Cyst of kidney, acquired: Secondary | ICD-10-CM | POA: Diagnosis not present

## 2020-03-19 DIAGNOSIS — R338 Other retention of urine: Secondary | ICD-10-CM | POA: Diagnosis not present

## 2020-03-19 DIAGNOSIS — R339 Retention of urine, unspecified: Secondary | ICD-10-CM | POA: Diagnosis not present

## 2020-03-19 DIAGNOSIS — N401 Enlarged prostate with lower urinary tract symptoms: Secondary | ICD-10-CM | POA: Diagnosis not present

## 2020-03-19 DIAGNOSIS — N1330 Unspecified hydronephrosis: Secondary | ICD-10-CM | POA: Diagnosis not present

## 2020-03-19 DIAGNOSIS — Z9079 Acquired absence of other genital organ(s): Secondary | ICD-10-CM | POA: Diagnosis not present

## 2020-03-19 DIAGNOSIS — N179 Acute kidney failure, unspecified: Secondary | ICD-10-CM | POA: Diagnosis not present

## 2020-03-19 DIAGNOSIS — I10 Essential (primary) hypertension: Secondary | ICD-10-CM | POA: Diagnosis not present

## 2020-03-19 DIAGNOSIS — I129 Hypertensive chronic kidney disease with stage 1 through stage 4 chronic kidney disease, or unspecified chronic kidney disease: Secondary | ICD-10-CM | POA: Diagnosis not present

## 2020-03-19 DIAGNOSIS — Z7901 Long term (current) use of anticoagulants: Secondary | ICD-10-CM | POA: Diagnosis not present

## 2020-03-19 DIAGNOSIS — N189 Chronic kidney disease, unspecified: Secondary | ICD-10-CM | POA: Diagnosis not present

## 2020-03-19 DIAGNOSIS — Z86010 Personal history of colonic polyps: Secondary | ICD-10-CM | POA: Diagnosis not present

## 2020-03-19 DIAGNOSIS — E785 Hyperlipidemia, unspecified: Secondary | ICD-10-CM | POA: Diagnosis not present

## 2020-03-19 DIAGNOSIS — I480 Paroxysmal atrial fibrillation: Secondary | ICD-10-CM | POA: Diagnosis not present

## 2020-03-19 DIAGNOSIS — L03116 Cellulitis of left lower limb: Secondary | ICD-10-CM | POA: Diagnosis not present

## 2020-03-19 DIAGNOSIS — I878 Other specified disorders of veins: Secondary | ICD-10-CM | POA: Diagnosis not present

## 2020-03-19 DIAGNOSIS — N138 Other obstructive and reflux uropathy: Secondary | ICD-10-CM | POA: Diagnosis not present

## 2020-03-19 DIAGNOSIS — Z8719 Personal history of other diseases of the digestive system: Secondary | ICD-10-CM | POA: Diagnosis not present

## 2020-03-19 DIAGNOSIS — D509 Iron deficiency anemia, unspecified: Secondary | ICD-10-CM | POA: Diagnosis not present

## 2020-03-19 DIAGNOSIS — R600 Localized edema: Secondary | ICD-10-CM | POA: Diagnosis not present

## 2020-03-19 DIAGNOSIS — E871 Hypo-osmolality and hyponatremia: Secondary | ICD-10-CM | POA: Diagnosis not present

## 2020-03-19 DIAGNOSIS — L03115 Cellulitis of right lower limb: Secondary | ICD-10-CM | POA: Diagnosis not present

## 2020-03-19 DIAGNOSIS — E1165 Type 2 diabetes mellitus with hyperglycemia: Secondary | ICD-10-CM | POA: Diagnosis not present

## 2020-03-25 DIAGNOSIS — N1831 Chronic kidney disease, stage 3a: Secondary | ICD-10-CM | POA: Diagnosis not present

## 2020-04-01 DIAGNOSIS — E785 Hyperlipidemia, unspecified: Secondary | ICD-10-CM | POA: Diagnosis not present

## 2020-04-01 DIAGNOSIS — E1169 Type 2 diabetes mellitus with other specified complication: Secondary | ICD-10-CM | POA: Diagnosis not present

## 2020-04-01 DIAGNOSIS — D509 Iron deficiency anemia, unspecified: Secondary | ICD-10-CM | POA: Diagnosis not present

## 2020-04-01 DIAGNOSIS — N401 Enlarged prostate with lower urinary tract symptoms: Secondary | ICD-10-CM | POA: Diagnosis not present

## 2020-04-01 DIAGNOSIS — I480 Paroxysmal atrial fibrillation: Secondary | ICD-10-CM | POA: Diagnosis not present

## 2020-04-01 DIAGNOSIS — I10 Essential (primary) hypertension: Secondary | ICD-10-CM | POA: Diagnosis not present

## 2020-04-01 DIAGNOSIS — D6869 Other thrombophilia: Secondary | ICD-10-CM | POA: Diagnosis not present

## 2020-04-03 DIAGNOSIS — R338 Other retention of urine: Secondary | ICD-10-CM | POA: Diagnosis not present

## 2020-04-03 DIAGNOSIS — I10 Essential (primary) hypertension: Secondary | ICD-10-CM | POA: Diagnosis not present

## 2020-04-03 DIAGNOSIS — I4891 Unspecified atrial fibrillation: Secondary | ICD-10-CM | POA: Diagnosis not present

## 2020-04-03 DIAGNOSIS — E785 Hyperlipidemia, unspecified: Secondary | ICD-10-CM | POA: Diagnosis present

## 2020-04-03 DIAGNOSIS — D649 Anemia, unspecified: Secondary | ICD-10-CM | POA: Diagnosis not present

## 2020-04-03 DIAGNOSIS — N179 Acute kidney failure, unspecified: Secondary | ICD-10-CM | POA: Diagnosis not present

## 2020-04-03 DIAGNOSIS — N401 Enlarged prostate with lower urinary tract symptoms: Secondary | ICD-10-CM | POA: Diagnosis not present

## 2020-04-03 DIAGNOSIS — N138 Other obstructive and reflux uropathy: Secondary | ICD-10-CM | POA: Diagnosis not present

## 2020-04-03 DIAGNOSIS — D509 Iron deficiency anemia, unspecified: Secondary | ICD-10-CM | POA: Diagnosis present

## 2020-04-04 DIAGNOSIS — R338 Other retention of urine: Secondary | ICD-10-CM | POA: Diagnosis not present

## 2020-04-16 DIAGNOSIS — D509 Iron deficiency anemia, unspecified: Secondary | ICD-10-CM | POA: Diagnosis not present

## 2020-04-16 DIAGNOSIS — N179 Acute kidney failure, unspecified: Secondary | ICD-10-CM | POA: Diagnosis not present

## 2020-05-01 DIAGNOSIS — I480 Paroxysmal atrial fibrillation: Secondary | ICD-10-CM | POA: Diagnosis not present

## 2020-05-01 DIAGNOSIS — I10 Essential (primary) hypertension: Secondary | ICD-10-CM | POA: Diagnosis not present

## 2020-05-07 DIAGNOSIS — R338 Other retention of urine: Secondary | ICD-10-CM | POA: Diagnosis not present

## 2020-05-07 DIAGNOSIS — R319 Hematuria, unspecified: Secondary | ICD-10-CM | POA: Diagnosis not present

## 2020-05-07 DIAGNOSIS — N390 Urinary tract infection, site not specified: Secondary | ICD-10-CM | POA: Diagnosis not present

## 2020-05-13 DIAGNOSIS — I471 Supraventricular tachycardia: Secondary | ICD-10-CM | POA: Diagnosis not present

## 2020-05-13 DIAGNOSIS — I472 Ventricular tachycardia: Secondary | ICD-10-CM | POA: Diagnosis not present

## 2020-06-05 DIAGNOSIS — N401 Enlarged prostate with lower urinary tract symptoms: Secondary | ICD-10-CM | POA: Diagnosis not present

## 2020-06-05 DIAGNOSIS — R338 Other retention of urine: Secondary | ICD-10-CM | POA: Diagnosis not present

## 2020-06-13 DIAGNOSIS — D509 Iron deficiency anemia, unspecified: Secondary | ICD-10-CM | POA: Diagnosis not present

## 2020-06-13 DIAGNOSIS — I10 Essential (primary) hypertension: Secondary | ICD-10-CM | POA: Diagnosis not present

## 2020-06-13 DIAGNOSIS — E785 Hyperlipidemia, unspecified: Secondary | ICD-10-CM | POA: Diagnosis not present

## 2020-06-13 DIAGNOSIS — I480 Paroxysmal atrial fibrillation: Secondary | ICD-10-CM | POA: Diagnosis not present

## 2020-07-02 DIAGNOSIS — I10 Essential (primary) hypertension: Secondary | ICD-10-CM | POA: Diagnosis not present

## 2020-07-02 DIAGNOSIS — E1169 Type 2 diabetes mellitus with other specified complication: Secondary | ICD-10-CM | POA: Diagnosis not present

## 2020-07-02 DIAGNOSIS — Z1159 Encounter for screening for other viral diseases: Secondary | ICD-10-CM | POA: Diagnosis not present

## 2020-07-02 DIAGNOSIS — D6869 Other thrombophilia: Secondary | ICD-10-CM | POA: Diagnosis not present

## 2020-07-02 DIAGNOSIS — D509 Iron deficiency anemia, unspecified: Secondary | ICD-10-CM | POA: Diagnosis not present

## 2020-07-02 DIAGNOSIS — I480 Paroxysmal atrial fibrillation: Secondary | ICD-10-CM | POA: Diagnosis not present

## 2020-07-02 DIAGNOSIS — E785 Hyperlipidemia, unspecified: Secondary | ICD-10-CM | POA: Diagnosis not present

## 2020-07-30 DIAGNOSIS — B952 Enterococcus as the cause of diseases classified elsewhere: Secondary | ICD-10-CM | POA: Diagnosis not present

## 2020-07-30 DIAGNOSIS — N390 Urinary tract infection, site not specified: Secondary | ICD-10-CM | POA: Diagnosis not present

## 2020-07-30 DIAGNOSIS — R319 Hematuria, unspecified: Secondary | ICD-10-CM | POA: Diagnosis not present

## 2020-07-30 DIAGNOSIS — R338 Other retention of urine: Secondary | ICD-10-CM | POA: Diagnosis not present

## 2020-07-31 DIAGNOSIS — B952 Enterococcus as the cause of diseases classified elsewhere: Secondary | ICD-10-CM | POA: Diagnosis not present

## 2020-07-31 DIAGNOSIS — R319 Hematuria, unspecified: Secondary | ICD-10-CM | POA: Diagnosis not present

## 2020-07-31 DIAGNOSIS — N390 Urinary tract infection, site not specified: Secondary | ICD-10-CM | POA: Diagnosis not present

## 2020-07-31 DIAGNOSIS — R338 Other retention of urine: Secondary | ICD-10-CM | POA: Diagnosis not present

## 2020-08-06 DIAGNOSIS — N400 Enlarged prostate without lower urinary tract symptoms: Secondary | ICD-10-CM | POA: Diagnosis not present

## 2020-08-06 DIAGNOSIS — Z9889 Other specified postprocedural states: Secondary | ICD-10-CM | POA: Diagnosis not present

## 2020-08-06 DIAGNOSIS — N138 Other obstructive and reflux uropathy: Secondary | ICD-10-CM | POA: Diagnosis not present

## 2020-08-06 DIAGNOSIS — R338 Other retention of urine: Secondary | ICD-10-CM | POA: Diagnosis not present

## 2020-08-06 DIAGNOSIS — N401 Enlarged prostate with lower urinary tract symptoms: Secondary | ICD-10-CM | POA: Diagnosis not present

## 2020-08-06 DIAGNOSIS — E1165 Type 2 diabetes mellitus with hyperglycemia: Secondary | ICD-10-CM | POA: Diagnosis not present

## 2020-08-12 DIAGNOSIS — R338 Other retention of urine: Secondary | ICD-10-CM | POA: Diagnosis not present

## 2020-08-12 DIAGNOSIS — N401 Enlarged prostate with lower urinary tract symptoms: Secondary | ICD-10-CM | POA: Diagnosis not present

## 2020-09-02 DIAGNOSIS — R338 Other retention of urine: Secondary | ICD-10-CM | POA: Diagnosis not present

## 2020-09-02 DIAGNOSIS — R8290 Unspecified abnormal findings in urine: Secondary | ICD-10-CM | POA: Diagnosis not present

## 2020-10-28 DIAGNOSIS — B3749 Other urogenital candidiasis: Secondary | ICD-10-CM | POA: Diagnosis not present

## 2020-10-28 DIAGNOSIS — R8290 Unspecified abnormal findings in urine: Secondary | ICD-10-CM | POA: Diagnosis not present

## 2020-10-28 DIAGNOSIS — R338 Other retention of urine: Secondary | ICD-10-CM | POA: Diagnosis not present

## 2020-10-28 DIAGNOSIS — N401 Enlarged prostate with lower urinary tract symptoms: Secondary | ICD-10-CM | POA: Diagnosis not present

## 2020-11-12 DIAGNOSIS — I10 Essential (primary) hypertension: Secondary | ICD-10-CM | POA: Diagnosis not present

## 2020-11-12 DIAGNOSIS — E785 Hyperlipidemia, unspecified: Secondary | ICD-10-CM | POA: Diagnosis not present

## 2020-11-12 DIAGNOSIS — E1169 Type 2 diabetes mellitus with other specified complication: Secondary | ICD-10-CM | POA: Diagnosis not present

## 2020-11-12 DIAGNOSIS — D509 Iron deficiency anemia, unspecified: Secondary | ICD-10-CM | POA: Diagnosis not present

## 2020-11-12 DIAGNOSIS — D6869 Other thrombophilia: Secondary | ICD-10-CM | POA: Diagnosis not present

## 2020-11-12 DIAGNOSIS — Z7984 Long term (current) use of oral hypoglycemic drugs: Secondary | ICD-10-CM | POA: Diagnosis not present

## 2020-11-12 DIAGNOSIS — I480 Paroxysmal atrial fibrillation: Secondary | ICD-10-CM | POA: Diagnosis not present

## 2020-11-26 DIAGNOSIS — D3131 Benign neoplasm of right choroid: Secondary | ICD-10-CM | POA: Diagnosis not present

## 2020-11-26 DIAGNOSIS — E119 Type 2 diabetes mellitus without complications: Secondary | ICD-10-CM | POA: Diagnosis not present

## 2020-11-26 DIAGNOSIS — H40053 Ocular hypertension, bilateral: Secondary | ICD-10-CM | POA: Diagnosis not present

## 2020-11-26 DIAGNOSIS — H59812 Chorioretinal scars after surgery for detachment, left eye: Secondary | ICD-10-CM | POA: Diagnosis not present

## 2021-01-06 DIAGNOSIS — L814 Other melanin hyperpigmentation: Secondary | ICD-10-CM | POA: Diagnosis not present

## 2021-01-06 DIAGNOSIS — L821 Other seborrheic keratosis: Secondary | ICD-10-CM | POA: Diagnosis not present

## 2021-01-06 DIAGNOSIS — D2272 Melanocytic nevi of left lower limb, including hip: Secondary | ICD-10-CM | POA: Diagnosis not present

## 2021-01-06 DIAGNOSIS — D234 Other benign neoplasm of skin of scalp and neck: Secondary | ICD-10-CM | POA: Diagnosis not present

## 2021-01-06 DIAGNOSIS — X32XXXS Exposure to sunlight, sequela: Secondary | ICD-10-CM | POA: Diagnosis not present

## 2021-01-06 DIAGNOSIS — D485 Neoplasm of uncertain behavior of skin: Secondary | ICD-10-CM | POA: Diagnosis not present

## 2021-01-06 DIAGNOSIS — Z85828 Personal history of other malignant neoplasm of skin: Secondary | ICD-10-CM | POA: Diagnosis not present

## 2021-01-06 DIAGNOSIS — D1801 Hemangioma of skin and subcutaneous tissue: Secondary | ICD-10-CM | POA: Diagnosis not present

## 2021-01-07 DIAGNOSIS — R338 Other retention of urine: Secondary | ICD-10-CM | POA: Diagnosis not present

## 2021-01-07 DIAGNOSIS — R8290 Unspecified abnormal findings in urine: Secondary | ICD-10-CM | POA: Diagnosis not present

## 2021-01-07 DIAGNOSIS — N3090 Cystitis, unspecified without hematuria: Secondary | ICD-10-CM | POA: Diagnosis not present

## 2021-02-18 DIAGNOSIS — E7800 Pure hypercholesterolemia, unspecified: Secondary | ICD-10-CM | POA: Diagnosis not present

## 2021-02-18 DIAGNOSIS — I10 Essential (primary) hypertension: Secondary | ICD-10-CM | POA: Diagnosis not present

## 2021-02-18 DIAGNOSIS — I480 Paroxysmal atrial fibrillation: Secondary | ICD-10-CM | POA: Diagnosis not present

## 2021-02-18 DIAGNOSIS — E113553 Type 2 diabetes mellitus with stable proliferative diabetic retinopathy, bilateral: Secondary | ICD-10-CM | POA: Diagnosis not present

## 2021-03-10 DIAGNOSIS — N401 Enlarged prostate with lower urinary tract symptoms: Secondary | ICD-10-CM | POA: Diagnosis not present

## 2021-03-10 DIAGNOSIS — Z5189 Encounter for other specified aftercare: Secondary | ICD-10-CM | POA: Diagnosis not present

## 2021-03-10 DIAGNOSIS — R8290 Unspecified abnormal findings in urine: Secondary | ICD-10-CM | POA: Diagnosis not present

## 2021-03-10 DIAGNOSIS — R338 Other retention of urine: Secondary | ICD-10-CM | POA: Diagnosis not present

## 2021-03-10 DIAGNOSIS — N429 Disorder of prostate, unspecified: Secondary | ICD-10-CM | POA: Diagnosis not present

## 2021-03-10 DIAGNOSIS — E119 Type 2 diabetes mellitus without complications: Secondary | ICD-10-CM | POA: Diagnosis not present

## 2021-03-10 DIAGNOSIS — B379 Candidiasis, unspecified: Secondary | ICD-10-CM | POA: Diagnosis not present

## 2021-03-13 DIAGNOSIS — I10 Essential (primary) hypertension: Secondary | ICD-10-CM | POA: Diagnosis not present

## 2021-03-13 DIAGNOSIS — Z0000 Encounter for general adult medical examination without abnormal findings: Secondary | ICD-10-CM | POA: Diagnosis not present

## 2021-03-13 DIAGNOSIS — E785 Hyperlipidemia, unspecified: Secondary | ICD-10-CM | POA: Diagnosis not present

## 2021-03-13 DIAGNOSIS — R809 Proteinuria, unspecified: Secondary | ICD-10-CM | POA: Diagnosis not present

## 2021-03-13 DIAGNOSIS — Z7984 Long term (current) use of oral hypoglycemic drugs: Secondary | ICD-10-CM | POA: Diagnosis not present

## 2021-03-13 DIAGNOSIS — I480 Paroxysmal atrial fibrillation: Secondary | ICD-10-CM | POA: Diagnosis not present

## 2021-03-13 DIAGNOSIS — L600 Ingrowing nail: Secondary | ICD-10-CM | POA: Diagnosis not present

## 2021-03-13 DIAGNOSIS — E1169 Type 2 diabetes mellitus with other specified complication: Secondary | ICD-10-CM | POA: Diagnosis not present

## 2021-04-15 DIAGNOSIS — L600 Ingrowing nail: Secondary | ICD-10-CM | POA: Diagnosis not present

## 2021-04-15 DIAGNOSIS — E113553 Type 2 diabetes mellitus with stable proliferative diabetic retinopathy, bilateral: Secondary | ICD-10-CM | POA: Diagnosis not present

## 2021-04-15 DIAGNOSIS — Z7984 Long term (current) use of oral hypoglycemic drugs: Secondary | ICD-10-CM | POA: Diagnosis not present

## 2021-04-15 DIAGNOSIS — L03032 Cellulitis of left toe: Secondary | ICD-10-CM | POA: Diagnosis not present

## 2021-04-15 DIAGNOSIS — Z794 Long term (current) use of insulin: Secondary | ICD-10-CM | POA: Diagnosis not present

## 2021-04-15 DIAGNOSIS — E1140 Type 2 diabetes mellitus with diabetic neuropathy, unspecified: Secondary | ICD-10-CM | POA: Diagnosis not present

## 2021-05-14 DIAGNOSIS — I10 Essential (primary) hypertension: Secondary | ICD-10-CM | POA: Diagnosis not present

## 2021-05-14 DIAGNOSIS — E1169 Type 2 diabetes mellitus with other specified complication: Secondary | ICD-10-CM | POA: Diagnosis not present

## 2021-05-14 DIAGNOSIS — E785 Hyperlipidemia, unspecified: Secondary | ICD-10-CM | POA: Diagnosis not present

## 2021-06-12 DIAGNOSIS — I480 Paroxysmal atrial fibrillation: Secondary | ICD-10-CM | POA: Diagnosis not present

## 2021-06-12 DIAGNOSIS — J310 Chronic rhinitis: Secondary | ICD-10-CM | POA: Diagnosis not present

## 2021-06-12 DIAGNOSIS — I10 Essential (primary) hypertension: Secondary | ICD-10-CM | POA: Diagnosis not present

## 2021-06-12 DIAGNOSIS — E785 Hyperlipidemia, unspecified: Secondary | ICD-10-CM | POA: Diagnosis not present

## 2021-06-12 DIAGNOSIS — D6869 Other thrombophilia: Secondary | ICD-10-CM | POA: Diagnosis not present

## 2021-06-12 DIAGNOSIS — R0989 Other specified symptoms and signs involving the circulatory and respiratory systems: Secondary | ICD-10-CM | POA: Diagnosis not present

## 2021-06-12 DIAGNOSIS — E1169 Type 2 diabetes mellitus with other specified complication: Secondary | ICD-10-CM | POA: Diagnosis not present

## 2021-06-13 ENCOUNTER — Other Ambulatory Visit: Payer: Self-pay | Admitting: Family Medicine

## 2021-06-13 DIAGNOSIS — R0989 Other specified symptoms and signs involving the circulatory and respiratory systems: Secondary | ICD-10-CM

## 2021-06-20 ENCOUNTER — Ambulatory Visit
Admission: RE | Admit: 2021-06-20 | Discharge: 2021-06-20 | Disposition: A | Discharge status: Home / Self Care | Payer: PPO | Source: Ambulatory Visit | Attending: Family Medicine | Admitting: Family Medicine

## 2021-06-20 DIAGNOSIS — R0989 Other specified symptoms and signs involving the circulatory and respiratory systems: Secondary | ICD-10-CM

## 2021-06-20 DIAGNOSIS — I6523 Occlusion and stenosis of bilateral carotid arteries: Secondary | ICD-10-CM | POA: Diagnosis not present

## 2021-07-15 DIAGNOSIS — I10 Essential (primary) hypertension: Secondary | ICD-10-CM | POA: Diagnosis not present

## 2021-07-15 DIAGNOSIS — E785 Hyperlipidemia, unspecified: Secondary | ICD-10-CM | POA: Diagnosis not present

## 2021-07-15 DIAGNOSIS — E1122 Type 2 diabetes mellitus with diabetic chronic kidney disease: Secondary | ICD-10-CM | POA: Diagnosis not present

## 2021-07-15 DIAGNOSIS — H9193 Unspecified hearing loss, bilateral: Secondary | ICD-10-CM | POA: Diagnosis not present

## 2021-07-15 DIAGNOSIS — E11319 Type 2 diabetes mellitus with unspecified diabetic retinopathy without macular edema: Secondary | ICD-10-CM | POA: Diagnosis not present

## 2021-07-15 DIAGNOSIS — E669 Obesity, unspecified: Secondary | ICD-10-CM | POA: Diagnosis not present

## 2021-07-15 DIAGNOSIS — J300 Vasomotor rhinitis: Secondary | ICD-10-CM | POA: Diagnosis not present

## 2021-07-15 DIAGNOSIS — E1165 Type 2 diabetes mellitus with hyperglycemia: Secondary | ICD-10-CM | POA: Diagnosis not present

## 2021-07-15 DIAGNOSIS — E1169 Type 2 diabetes mellitus with other specified complication: Secondary | ICD-10-CM | POA: Diagnosis not present

## 2021-07-15 DIAGNOSIS — N400 Enlarged prostate without lower urinary tract symptoms: Secondary | ICD-10-CM | POA: Diagnosis not present

## 2021-07-15 DIAGNOSIS — N189 Chronic kidney disease, unspecified: Secondary | ICD-10-CM | POA: Diagnosis not present

## 2021-07-15 DIAGNOSIS — Z794 Long term (current) use of insulin: Secondary | ICD-10-CM | POA: Diagnosis not present

## 2021-08-04 DIAGNOSIS — R338 Other retention of urine: Secondary | ICD-10-CM | POA: Diagnosis not present

## 2021-08-05 DIAGNOSIS — E1169 Type 2 diabetes mellitus with other specified complication: Secondary | ICD-10-CM | POA: Diagnosis not present

## 2021-08-05 DIAGNOSIS — E785 Hyperlipidemia, unspecified: Secondary | ICD-10-CM | POA: Diagnosis not present

## 2021-08-05 DIAGNOSIS — I10 Essential (primary) hypertension: Secondary | ICD-10-CM | POA: Diagnosis not present

## 2021-08-07 DIAGNOSIS — E1142 Type 2 diabetes mellitus with diabetic polyneuropathy: Secondary | ICD-10-CM | POA: Diagnosis not present

## 2021-08-07 DIAGNOSIS — L97422 Non-pressure chronic ulcer of left heel and midfoot with fat layer exposed: Secondary | ICD-10-CM | POA: Diagnosis not present

## 2021-08-07 DIAGNOSIS — E11621 Type 2 diabetes mellitus with foot ulcer: Secondary | ICD-10-CM | POA: Diagnosis not present

## 2021-08-14 DIAGNOSIS — L97422 Non-pressure chronic ulcer of left heel and midfoot with fat layer exposed: Secondary | ICD-10-CM | POA: Diagnosis not present

## 2021-08-14 DIAGNOSIS — E11621 Type 2 diabetes mellitus with foot ulcer: Secondary | ICD-10-CM | POA: Diagnosis not present

## 2021-08-14 DIAGNOSIS — E1140 Type 2 diabetes mellitus with diabetic neuropathy, unspecified: Secondary | ICD-10-CM | POA: Diagnosis not present

## 2021-08-19 DIAGNOSIS — I480 Paroxysmal atrial fibrillation: Secondary | ICD-10-CM | POA: Diagnosis not present

## 2021-08-19 DIAGNOSIS — E113553 Type 2 diabetes mellitus with stable proliferative diabetic retinopathy, bilateral: Secondary | ICD-10-CM | POA: Diagnosis not present

## 2021-08-19 DIAGNOSIS — E785 Hyperlipidemia, unspecified: Secondary | ICD-10-CM | POA: Diagnosis not present

## 2021-08-19 DIAGNOSIS — I10 Essential (primary) hypertension: Secondary | ICD-10-CM | POA: Diagnosis not present

## 2021-08-20 DIAGNOSIS — I480 Paroxysmal atrial fibrillation: Secondary | ICD-10-CM | POA: Diagnosis not present

## 2021-09-11 DIAGNOSIS — E1140 Type 2 diabetes mellitus with diabetic neuropathy, unspecified: Secondary | ICD-10-CM | POA: Diagnosis not present

## 2021-09-11 DIAGNOSIS — L97422 Non-pressure chronic ulcer of left heel and midfoot with fat layer exposed: Secondary | ICD-10-CM | POA: Diagnosis not present

## 2021-09-11 DIAGNOSIS — E11621 Type 2 diabetes mellitus with foot ulcer: Secondary | ICD-10-CM | POA: Diagnosis not present

## 2021-10-15 DIAGNOSIS — D6869 Other thrombophilia: Secondary | ICD-10-CM | POA: Diagnosis not present

## 2021-10-15 DIAGNOSIS — I480 Paroxysmal atrial fibrillation: Secondary | ICD-10-CM | POA: Diagnosis not present

## 2021-10-15 DIAGNOSIS — I10 Essential (primary) hypertension: Secondary | ICD-10-CM | POA: Diagnosis not present

## 2021-10-15 DIAGNOSIS — N182 Chronic kidney disease, stage 2 (mild): Secondary | ICD-10-CM | POA: Diagnosis not present

## 2021-10-15 DIAGNOSIS — D509 Iron deficiency anemia, unspecified: Secondary | ICD-10-CM | POA: Diagnosis not present

## 2021-10-15 DIAGNOSIS — E1169 Type 2 diabetes mellitus with other specified complication: Secondary | ICD-10-CM | POA: Diagnosis not present

## 2021-10-15 DIAGNOSIS — E785 Hyperlipidemia, unspecified: Secondary | ICD-10-CM | POA: Diagnosis not present

## 2021-10-15 DIAGNOSIS — D72829 Elevated white blood cell count, unspecified: Secondary | ICD-10-CM | POA: Diagnosis not present

## 2021-10-21 DIAGNOSIS — L97401 Non-pressure chronic ulcer of unspecified heel and midfoot limited to breakdown of skin: Secondary | ICD-10-CM | POA: Diagnosis not present

## 2021-10-21 DIAGNOSIS — E11621 Type 2 diabetes mellitus with foot ulcer: Secondary | ICD-10-CM | POA: Diagnosis not present

## 2021-10-21 DIAGNOSIS — E1142 Type 2 diabetes mellitus with diabetic polyneuropathy: Secondary | ICD-10-CM | POA: Diagnosis not present

## 2021-10-30 DIAGNOSIS — E1142 Type 2 diabetes mellitus with diabetic polyneuropathy: Secondary | ICD-10-CM | POA: Diagnosis not present

## 2021-10-30 DIAGNOSIS — L97422 Non-pressure chronic ulcer of left heel and midfoot with fat layer exposed: Secondary | ICD-10-CM | POA: Diagnosis not present

## 2021-10-30 DIAGNOSIS — E11621 Type 2 diabetes mellitus with foot ulcer: Secondary | ICD-10-CM | POA: Diagnosis not present

## 2021-11-13 DIAGNOSIS — Z8631 Personal history of diabetic foot ulcer: Secondary | ICD-10-CM | POA: Diagnosis not present

## 2021-11-13 DIAGNOSIS — E1142 Type 2 diabetes mellitus with diabetic polyneuropathy: Secondary | ICD-10-CM | POA: Diagnosis not present

## 2021-11-13 DIAGNOSIS — L84 Corns and callosities: Secondary | ICD-10-CM | POA: Diagnosis not present

## 2021-11-19 DIAGNOSIS — R8290 Unspecified abnormal findings in urine: Secondary | ICD-10-CM | POA: Diagnosis not present

## 2021-11-19 DIAGNOSIS — R338 Other retention of urine: Secondary | ICD-10-CM | POA: Diagnosis not present

## 2022-01-06 DIAGNOSIS — D229 Melanocytic nevi, unspecified: Secondary | ICD-10-CM | POA: Diagnosis not present

## 2022-01-06 DIAGNOSIS — L570 Actinic keratosis: Secondary | ICD-10-CM | POA: Diagnosis not present

## 2022-01-06 DIAGNOSIS — X32XXXS Exposure to sunlight, sequela: Secondary | ICD-10-CM | POA: Diagnosis not present

## 2022-01-06 DIAGNOSIS — L814 Other melanin hyperpigmentation: Secondary | ICD-10-CM | POA: Diagnosis not present

## 2022-01-06 DIAGNOSIS — D224 Melanocytic nevi of scalp and neck: Secondary | ICD-10-CM | POA: Diagnosis not present

## 2022-01-06 DIAGNOSIS — I781 Nevus, non-neoplastic: Secondary | ICD-10-CM | POA: Diagnosis not present

## 2022-01-06 DIAGNOSIS — E669 Obesity, unspecified: Secondary | ICD-10-CM | POA: Diagnosis not present

## 2022-01-06 DIAGNOSIS — D485 Neoplasm of uncertain behavior of skin: Secondary | ICD-10-CM | POA: Diagnosis not present

## 2022-01-06 DIAGNOSIS — L821 Other seborrheic keratosis: Secondary | ICD-10-CM | POA: Diagnosis not present

## 2022-01-20 DIAGNOSIS — Z87440 Personal history of urinary (tract) infections: Secondary | ICD-10-CM | POA: Diagnosis not present

## 2022-01-20 DIAGNOSIS — R338 Other retention of urine: Secondary | ICD-10-CM | POA: Diagnosis not present

## 2022-01-20 DIAGNOSIS — N529 Male erectile dysfunction, unspecified: Secondary | ICD-10-CM | POA: Diagnosis not present

## 2022-01-20 DIAGNOSIS — R8290 Unspecified abnormal findings in urine: Secondary | ICD-10-CM | POA: Diagnosis not present

## 2022-01-20 DIAGNOSIS — N400 Enlarged prostate without lower urinary tract symptoms: Secondary | ICD-10-CM | POA: Diagnosis not present

## 2022-01-29 DIAGNOSIS — Z8669 Personal history of other diseases of the nervous system and sense organs: Secondary | ICD-10-CM | POA: Diagnosis not present

## 2022-01-29 DIAGNOSIS — E119 Type 2 diabetes mellitus without complications: Secondary | ICD-10-CM | POA: Diagnosis not present

## 2022-01-29 DIAGNOSIS — H59812 Chorioretinal scars after surgery for detachment, left eye: Secondary | ICD-10-CM | POA: Diagnosis not present

## 2022-01-29 DIAGNOSIS — Z961 Presence of intraocular lens: Secondary | ICD-10-CM | POA: Diagnosis not present

## 2022-01-29 DIAGNOSIS — D3131 Benign neoplasm of right choroid: Secondary | ICD-10-CM | POA: Diagnosis not present

## 2022-01-29 DIAGNOSIS — H40053 Ocular hypertension, bilateral: Secondary | ICD-10-CM | POA: Diagnosis not present

## 2022-04-03 DIAGNOSIS — I480 Paroxysmal atrial fibrillation: Secondary | ICD-10-CM | POA: Diagnosis not present

## 2022-04-03 DIAGNOSIS — I10 Essential (primary) hypertension: Secondary | ICD-10-CM | POA: Diagnosis not present

## 2022-04-03 DIAGNOSIS — E7800 Pure hypercholesterolemia, unspecified: Secondary | ICD-10-CM | POA: Diagnosis not present

## 2022-04-03 DIAGNOSIS — E113553 Type 2 diabetes mellitus with stable proliferative diabetic retinopathy, bilateral: Secondary | ICD-10-CM | POA: Diagnosis not present

## 2022-04-09 DIAGNOSIS — D72829 Elevated white blood cell count, unspecified: Secondary | ICD-10-CM | POA: Diagnosis not present

## 2022-04-09 DIAGNOSIS — I480 Paroxysmal atrial fibrillation: Secondary | ICD-10-CM | POA: Diagnosis not present

## 2022-04-09 DIAGNOSIS — D509 Iron deficiency anemia, unspecified: Secondary | ICD-10-CM | POA: Diagnosis not present

## 2022-04-09 DIAGNOSIS — N182 Chronic kidney disease, stage 2 (mild): Secondary | ICD-10-CM | POA: Diagnosis not present

## 2022-04-09 DIAGNOSIS — E1169 Type 2 diabetes mellitus with other specified complication: Secondary | ICD-10-CM | POA: Diagnosis not present

## 2022-04-09 DIAGNOSIS — D6869 Other thrombophilia: Secondary | ICD-10-CM | POA: Diagnosis not present

## 2022-04-09 DIAGNOSIS — Z23 Encounter for immunization: Secondary | ICD-10-CM | POA: Diagnosis not present

## 2022-04-09 DIAGNOSIS — H9193 Unspecified hearing loss, bilateral: Secondary | ICD-10-CM | POA: Diagnosis not present

## 2022-04-09 DIAGNOSIS — Z0000 Encounter for general adult medical examination without abnormal findings: Secondary | ICD-10-CM | POA: Diagnosis not present

## 2022-04-09 DIAGNOSIS — E785 Hyperlipidemia, unspecified: Secondary | ICD-10-CM | POA: Diagnosis not present

## 2022-04-09 DIAGNOSIS — Z1211 Encounter for screening for malignant neoplasm of colon: Secondary | ICD-10-CM | POA: Diagnosis not present

## 2022-04-09 DIAGNOSIS — I10 Essential (primary) hypertension: Secondary | ICD-10-CM | POA: Diagnosis not present

## 2022-04-14 DIAGNOSIS — N138 Other obstructive and reflux uropathy: Secondary | ICD-10-CM | POA: Diagnosis not present

## 2022-04-14 DIAGNOSIS — N401 Enlarged prostate with lower urinary tract symptoms: Secondary | ICD-10-CM | POA: Diagnosis not present

## 2022-04-17 ENCOUNTER — Emergency Department (HOSPITAL_COMMUNITY): Payer: PPO

## 2022-04-17 ENCOUNTER — Inpatient Hospital Stay (HOSPITAL_COMMUNITY)
Admission: EM | Admit: 2022-04-17 | Discharge: 2022-04-27 | DRG: 871 | Disposition: A | Discharge status: Home Health | Payer: PPO | Attending: Internal Medicine | Admitting: Internal Medicine

## 2022-04-17 ENCOUNTER — Encounter (HOSPITAL_COMMUNITY): Payer: Self-pay

## 2022-04-17 ENCOUNTER — Other Ambulatory Visit: Payer: Self-pay

## 2022-04-17 DIAGNOSIS — L8990 Pressure ulcer of unspecified site, unspecified stage: Secondary | ICD-10-CM | POA: Diagnosis present

## 2022-04-17 DIAGNOSIS — B9561 Methicillin susceptible Staphylococcus aureus infection as the cause of diseases classified elsewhere: Secondary | ICD-10-CM | POA: Diagnosis present

## 2022-04-17 DIAGNOSIS — I4891 Unspecified atrial fibrillation: Secondary | ICD-10-CM | POA: Diagnosis present

## 2022-04-17 DIAGNOSIS — N201 Calculus of ureter: Secondary | ICD-10-CM | POA: Diagnosis not present

## 2022-04-17 DIAGNOSIS — R6520 Severe sepsis without septic shock: Secondary | ICD-10-CM | POA: Diagnosis present

## 2022-04-17 DIAGNOSIS — N132 Hydronephrosis with renal and ureteral calculous obstruction: Secondary | ICD-10-CM | POA: Diagnosis present

## 2022-04-17 DIAGNOSIS — R739 Hyperglycemia, unspecified: Secondary | ICD-10-CM | POA: Diagnosis not present

## 2022-04-17 DIAGNOSIS — E669 Obesity, unspecified: Secondary | ICD-10-CM | POA: Diagnosis present

## 2022-04-17 DIAGNOSIS — N138 Other obstructive and reflux uropathy: Secondary | ICD-10-CM | POA: Diagnosis present

## 2022-04-17 DIAGNOSIS — N179 Acute kidney failure, unspecified: Secondary | ICD-10-CM | POA: Diagnosis not present

## 2022-04-17 DIAGNOSIS — Z7984 Long term (current) use of oral hypoglycemic drugs: Secondary | ICD-10-CM

## 2022-04-17 DIAGNOSIS — E785 Hyperlipidemia, unspecified: Secondary | ICD-10-CM | POA: Diagnosis present

## 2022-04-17 DIAGNOSIS — I10 Essential (primary) hypertension: Secondary | ICD-10-CM | POA: Diagnosis present

## 2022-04-17 DIAGNOSIS — E1165 Type 2 diabetes mellitus with hyperglycemia: Secondary | ICD-10-CM | POA: Diagnosis present

## 2022-04-17 DIAGNOSIS — L89152 Pressure ulcer of sacral region, stage 2: Secondary | ICD-10-CM | POA: Diagnosis present

## 2022-04-17 DIAGNOSIS — E871 Hypo-osmolality and hyponatremia: Secondary | ICD-10-CM | POA: Diagnosis not present

## 2022-04-17 DIAGNOSIS — I480 Paroxysmal atrial fibrillation: Secondary | ICD-10-CM | POA: Diagnosis present

## 2022-04-17 DIAGNOSIS — E860 Dehydration: Secondary | ICD-10-CM | POA: Diagnosis present

## 2022-04-17 DIAGNOSIS — K8010 Calculus of gallbladder with chronic cholecystitis without obstruction: Secondary | ICD-10-CM | POA: Diagnosis present

## 2022-04-17 DIAGNOSIS — R000 Tachycardia, unspecified: Secondary | ICD-10-CM | POA: Diagnosis not present

## 2022-04-17 DIAGNOSIS — R338 Other retention of urine: Secondary | ICD-10-CM | POA: Diagnosis present

## 2022-04-17 DIAGNOSIS — M1990 Unspecified osteoarthritis, unspecified site: Secondary | ICD-10-CM | POA: Diagnosis present

## 2022-04-17 DIAGNOSIS — U071 COVID-19: Secondary | ICD-10-CM | POA: Diagnosis present

## 2022-04-17 DIAGNOSIS — A4189 Other specified sepsis: Secondary | ICD-10-CM | POA: Diagnosis present

## 2022-04-17 DIAGNOSIS — A4159 Other Gram-negative sepsis: Secondary | ICD-10-CM | POA: Diagnosis present

## 2022-04-17 DIAGNOSIS — R8290 Unspecified abnormal findings in urine: Secondary | ICD-10-CM | POA: Diagnosis present

## 2022-04-17 DIAGNOSIS — A419 Sepsis, unspecified organism: Secondary | ICD-10-CM | POA: Diagnosis present

## 2022-04-17 DIAGNOSIS — G4700 Insomnia, unspecified: Secondary | ICD-10-CM | POA: Diagnosis present

## 2022-04-17 DIAGNOSIS — R509 Fever, unspecified: Secondary | ICD-10-CM | POA: Diagnosis not present

## 2022-04-17 DIAGNOSIS — M6282 Rhabdomyolysis: Secondary | ICD-10-CM | POA: Diagnosis present

## 2022-04-17 DIAGNOSIS — R531 Weakness: Secondary | ICD-10-CM | POA: Diagnosis not present

## 2022-04-17 DIAGNOSIS — E876 Hypokalemia: Secondary | ICD-10-CM | POA: Diagnosis not present

## 2022-04-17 DIAGNOSIS — Z880 Allergy status to penicillin: Secondary | ICD-10-CM

## 2022-04-17 DIAGNOSIS — Z794 Long term (current) use of insulin: Secondary | ICD-10-CM

## 2022-04-17 DIAGNOSIS — B961 Klebsiella pneumoniae [K. pneumoniae] as the cause of diseases classified elsewhere: Secondary | ICD-10-CM | POA: Diagnosis not present

## 2022-04-17 DIAGNOSIS — I4811 Longstanding persistent atrial fibrillation: Secondary | ICD-10-CM | POA: Diagnosis not present

## 2022-04-17 DIAGNOSIS — K828 Other specified diseases of gallbladder: Secondary | ICD-10-CM | POA: Diagnosis not present

## 2022-04-17 DIAGNOSIS — K5900 Constipation, unspecified: Secondary | ICD-10-CM | POA: Diagnosis not present

## 2022-04-17 DIAGNOSIS — Z7901 Long term (current) use of anticoagulants: Secondary | ICD-10-CM

## 2022-04-17 DIAGNOSIS — R7881 Bacteremia: Secondary | ICD-10-CM | POA: Diagnosis not present

## 2022-04-17 DIAGNOSIS — N401 Enlarged prostate with lower urinary tract symptoms: Secondary | ICD-10-CM | POA: Diagnosis present

## 2022-04-17 DIAGNOSIS — R109 Unspecified abdominal pain: Secondary | ICD-10-CM | POA: Diagnosis not present

## 2022-04-17 DIAGNOSIS — N1330 Unspecified hydronephrosis: Secondary | ICD-10-CM | POA: Diagnosis not present

## 2022-04-17 DIAGNOSIS — J9811 Atelectasis: Secondary | ICD-10-CM | POA: Diagnosis not present

## 2022-04-17 DIAGNOSIS — K219 Gastro-esophageal reflux disease without esophagitis: Secondary | ICD-10-CM | POA: Diagnosis present

## 2022-04-17 DIAGNOSIS — Z6832 Body mass index (BMI) 32.0-32.9, adult: Secondary | ICD-10-CM

## 2022-04-17 DIAGNOSIS — D509 Iron deficiency anemia, unspecified: Secondary | ICD-10-CM | POA: Diagnosis present

## 2022-04-17 DIAGNOSIS — D500 Iron deficiency anemia secondary to blood loss (chronic): Secondary | ICD-10-CM | POA: Diagnosis not present

## 2022-04-17 DIAGNOSIS — K8020 Calculus of gallbladder without cholecystitis without obstruction: Secondary | ICD-10-CM | POA: Diagnosis not present

## 2022-04-17 DIAGNOSIS — Z79899 Other long term (current) drug therapy: Secondary | ICD-10-CM

## 2022-04-17 DIAGNOSIS — E113553 Type 2 diabetes mellitus with stable proliferative diabetic retinopathy, bilateral: Secondary | ICD-10-CM | POA: Diagnosis present

## 2022-04-17 DIAGNOSIS — K7689 Other specified diseases of liver: Secondary | ICD-10-CM | POA: Diagnosis not present

## 2022-04-17 LAB — CBC WITH DIFFERENTIAL/PLATELET
Abs Immature Granulocytes: 0.07 10*3/uL (ref 0.00–0.07)
Basophils Absolute: 0.1 10*3/uL (ref 0.0–0.1)
Basophils Relative: 0 %
Eosinophils Absolute: 0 10*3/uL (ref 0.0–0.5)
Eosinophils Relative: 0 %
HCT: 36.9 % — ABNORMAL LOW (ref 39.0–52.0)
Hemoglobin: 12.2 g/dL — ABNORMAL LOW (ref 13.0–17.0)
Immature Granulocytes: 1 %
Lymphocytes Relative: 2 %
Lymphs Abs: 0.3 10*3/uL — ABNORMAL LOW (ref 0.7–4.0)
MCH: 29.6 pg (ref 26.0–34.0)
MCHC: 33.1 g/dL (ref 30.0–36.0)
MCV: 89.6 fL (ref 80.0–100.0)
Monocytes Absolute: 0.9 10*3/uL (ref 0.1–1.0)
Monocytes Relative: 7 %
Neutro Abs: 12.5 10*3/uL — ABNORMAL HIGH (ref 1.7–7.7)
Neutrophils Relative %: 90 %
Platelets: 184 10*3/uL (ref 150–400)
RBC: 4.12 MIL/uL — ABNORMAL LOW (ref 4.22–5.81)
RDW: 14.4 % (ref 11.5–15.5)
WBC: 13.8 10*3/uL — ABNORMAL HIGH (ref 4.0–10.5)
nRBC: 0 % (ref 0.0–0.2)

## 2022-04-17 LAB — COMPREHENSIVE METABOLIC PANEL
ALT: 19 U/L (ref 0–44)
AST: 38 U/L (ref 15–41)
Albumin: 3.9 g/dL (ref 3.5–5.0)
Alkaline Phosphatase: 70 U/L (ref 38–126)
Anion gap: 10 (ref 5–15)
BUN: 20 mg/dL (ref 8–23)
CO2: 21 mmol/L — ABNORMAL LOW (ref 22–32)
Calcium: 8.8 mg/dL — ABNORMAL LOW (ref 8.9–10.3)
Chloride: 105 mmol/L (ref 98–111)
Creatinine, Ser: 1.11 mg/dL (ref 0.61–1.24)
GFR, Estimated: >60 mL/min (ref >60)
Glucose, Bld: 358 mg/dL — ABNORMAL HIGH (ref 70–99)
Potassium: 4 mmol/L (ref 3.5–5.1)
Sodium: 136 mmol/L (ref 135–145)
Total Bilirubin: 0.9 mg/dL (ref 0.3–1.2)
Total Protein: 7.1 g/dL (ref 6.5–8.1)

## 2022-04-17 LAB — RESP PANEL BY RT-PCR (RSV, FLU A&B, COVID)  RVPGX2
Influenza A by PCR: NEGATIVE
Influenza B by PCR: NEGATIVE
Resp Syncytial Virus by PCR: NEGATIVE
SARS Coronavirus 2 by RT PCR: POSITIVE — AB

## 2022-04-17 LAB — CK: Total CK: 1017 U/L — ABNORMAL HIGH (ref 49–397)

## 2022-04-17 LAB — LACTIC ACID, PLASMA: Lactic Acid, Venous: 2.2 mmol/L (ref 0.5–1.9)

## 2022-04-17 LAB — CBG MONITORING, ED: Glucose-Capillary: 402 mg/dL — ABNORMAL HIGH (ref 70–99)

## 2022-04-17 MED ORDER — ENOXAPARIN SODIUM 40 MG/0.4ML IJ SOSY
40.0000 mg | PREFILLED_SYRINGE | INTRAMUSCULAR | Status: DC
  Administered 2022-04-17 – 2022-04-27 (×10): 40 mg via SUBCUTANEOUS
  Filled 2022-04-17 (×10): qty 0.4

## 2022-04-17 MED ORDER — LISINOPRIL 20 MG PO TABS
40.0000 mg | ORAL_TABLET | Freq: Every day | ORAL | Status: DC
  Administered 2022-04-18 – 2022-04-19 (×2): 40 mg via ORAL
  Filled 2022-04-17: qty 4
  Filled 2022-04-17: qty 2

## 2022-04-17 MED ORDER — ADULT MULTIVITAMIN W/MINERALS CH
1.0000 | ORAL_TABLET | Freq: Every day | ORAL | Status: DC
  Administered 2022-04-18 – 2022-04-27 (×10): 1 via ORAL
  Filled 2022-04-17 (×10): qty 1

## 2022-04-17 MED ORDER — ASPIRIN 81 MG PO TBEC
81.0000 mg | DELAYED_RELEASE_TABLET | Freq: Every day | ORAL | Status: DC
  Administered 2022-04-18 – 2022-04-27 (×10): 81 mg via ORAL
  Filled 2022-04-17 (×10): qty 1

## 2022-04-17 MED ORDER — SODIUM CHLORIDE 0.9 % IV BOLUS
1000.0000 mL | Freq: Once | INTRAVENOUS | Status: AC
  Administered 2022-04-17: 1000 mL via INTRAVENOUS

## 2022-04-17 MED ORDER — TAMSULOSIN HCL 0.4 MG PO CAPS
0.4000 mg | ORAL_CAPSULE | Freq: Every day | ORAL | Status: DC
  Administered 2022-04-17 – 2022-04-18 (×2): 0.4 mg via ORAL
  Filled 2022-04-17 (×2): qty 1

## 2022-04-17 MED ORDER — DILTIAZEM HCL ER COATED BEADS 180 MG PO CP24
180.0000 mg | ORAL_CAPSULE | Freq: Every day | ORAL | Status: DC
  Administered 2022-04-18 – 2022-04-22 (×5): 180 mg via ORAL
  Filled 2022-04-17 (×5): qty 1

## 2022-04-17 MED ORDER — ACETAMINOPHEN 325 MG PO TABS
650.0000 mg | ORAL_TABLET | Freq: Four times a day (QID) | ORAL | Status: DC | PRN
  Administered 2022-04-18 – 2022-04-26 (×5): 650 mg via ORAL
  Filled 2022-04-17 (×5): qty 2

## 2022-04-17 MED ORDER — VITAMIN C 500 MG PO TABS
500.0000 mg | ORAL_TABLET | Freq: Every day | ORAL | Status: DC
  Administered 2022-04-18 – 2022-04-27 (×10): 500 mg via ORAL
  Filled 2022-04-17 (×10): qty 1

## 2022-04-17 MED ORDER — ZINC SULFATE 220 (50 ZN) MG PO CAPS
220.0000 mg | ORAL_CAPSULE | Freq: Every day | ORAL | Status: DC
  Administered 2022-04-18 – 2022-04-27 (×10): 220 mg via ORAL
  Filled 2022-04-17 (×9): qty 1

## 2022-04-17 MED ORDER — ALBUTEROL SULFATE HFA 108 (90 BASE) MCG/ACT IN AERS: 2.0000 | INHALATION_SPRAY | Freq: Four times a day (QID) | RESPIRATORY_TRACT | Status: DC | PRN

## 2022-04-17 MED ORDER — SODIUM CHLORIDE 0.9 % IV SOLN: INTRAVENOUS | Status: DC

## 2022-04-17 MED ORDER — INSULIN GLARGINE-YFGN 100 UNIT/ML ~~LOC~~ SOLN
15.0000 [IU] | Freq: Two times a day (BID) | SUBCUTANEOUS | Status: DC
  Administered 2022-04-18 (×2): 15 [IU] via SUBCUTANEOUS
  Filled 2022-04-17 (×3): qty 0.15

## 2022-04-17 MED ORDER — VITAMIN D 25 MCG (1000 UNIT) PO TABS
5000.0000 [IU] | ORAL_TABLET | Freq: Every day | ORAL | Status: DC
  Administered 2022-04-18 – 2022-04-27 (×10): 5000 [IU] via ORAL
  Filled 2022-04-17 (×10): qty 5

## 2022-04-17 MED ORDER — ACETAMINOPHEN 325 MG PO TABS
650.0000 mg | ORAL_TABLET | Freq: Once | ORAL | Status: AC
  Administered 2022-04-17: 650 mg via ORAL
  Filled 2022-04-17: qty 2

## 2022-04-17 MED ORDER — INSULIN ASPART 100 UNIT/ML IJ SOLN
0.0000 [IU] | Freq: Three times a day (TID) | INTRAMUSCULAR | Status: DC
  Administered 2022-04-18 (×2): 11 [IU] via SUBCUTANEOUS
  Administered 2022-04-18: 8 [IU] via SUBCUTANEOUS
  Administered 2022-04-19: 5 [IU] via SUBCUTANEOUS
  Administered 2022-04-19: 11 [IU] via SUBCUTANEOUS
  Administered 2022-04-19 – 2022-04-20 (×2): 3 [IU] via SUBCUTANEOUS
  Filled 2022-04-17: qty 0.15

## 2022-04-17 MED ORDER — NIRMATRELVIR/RITONAVIR (PAXLOVID)TABLET
3.0000 | ORAL_TABLET | Freq: Two times a day (BID) | ORAL | Status: AC
  Administered 2022-04-17 – 2022-04-22 (×10): 3 via ORAL
  Filled 2022-04-17: qty 30

## 2022-04-17 MED ORDER — GUAIFENESIN-DM 100-10 MG/5ML PO SYRP: 10.0000 mL | ORAL_SOLUTION | ORAL | Status: DC | PRN

## 2022-04-17 NOTE — Assessment & Plan Note (Signed)
Stable, continue to monitor

## 2022-04-17 NOTE — H&P (Signed)
History and Physical    Patient: David Sharp NKN:[id number] DOB: 06-Apr-1945 DOA: 04/17/2022 DOS: the patient was seen and examined on 04/17/2022 PCP: [name], MD  Patient coming from: Home - lives with his wife. Ambulates independently.    Chief Complaint: weakness/fever  HPI: David Sharp is a 76 y.o. male with medical history significant of T2DM, IDA, HLD, HTN, BPH, afib who presented to ED with complaints of weakness and fever.  This morning his son could not get him up out of bed. He had slid down on the floor and his son could not get him up. His wife tested positive on Tuesday. He had a fever this morning at his house to 103. He has no cough or congestion. He has no body aches. He states he took the last pfizer booster in 11/2021 with his flu shot.    Denies any vision changes/headaches, chest pain or palpitations, shortness of breath or cough, abdominal pain, N/V/D, dysuria or leg swelling.   He does not smoke or drink alcohol.   ER Course:  vitals: tem: 98.2>100.6, bp: 178/79, HR: 101, oxygen: 93% on RA Pertinent labs: covid positive, wbc: 13.8, hgb: 12.2, glucose: 358, lactic acid: 2.2, CK: 1017 CXR: no active disease In ED: given 1L IVF and tylenol. TRH asked to admit.    Review of Systems: As mentioned in the history of present illness. All other systems reviewed and are negative. Past Medical History:  Diagnosis Date   Acute urinary retention    Arthritis    BPH (benign prostatic hyperplasia)    Foley catheter in place    GERD (gastroesophageal reflux disease)    Hyperlipidemia    Hypertension    Type 2 diabetes mellitus ([place])    Wears glasses    Past Surgical History:  Procedure Laterality Date   CATARACT EXTRACTION W/ INTRAOCULAR LENS IMPLANT Right 1990's   INGUINAL HERNIA REPAIR Right 2007  approx   RETINAL DETACHMENT SURGERY Bilateral last one , left eye 1997/  right eye 1990's   TRANSURETHRAL RESECTION OF PROSTATE N/A 01/27/2016   Procedure:  TRANSURETHRAL RESECTION OF THE PROSTATE (TURP) WITH GYRUS;  Surgeon: Kathie Rhodes, MD;  Location: [facility];  Service: Urology;  Laterality: N/A;   Social History:  reports that he has never smoked. He has never used smokeless tobacco. He reports current alcohol use. He reports that he does not use drugs.  Allergies  Allergen Reactions   Amoxicillin Hives    History reviewed. No pertinent family history.  Prior to Admission medications   Medication Sig Start Date End Date Taking? Authorizing Provider  acetaminophen (TYLENOL) 500 MG tablet Take 1,000 mg by mouth every 6 (six) hours as needed for moderate pain.    [provider]  apixaban (ELIQUIS) 5 MG TABS tablet Take 1 tablet (5 mg total) by mouth 2 (two) times daily. 02/03/20   Mercy Riding, MD  Ascorbic Acid (VITAMIN C) 1000 MG tablet Take 1,000 mg by mouth daily.    [provider]  aspirin EC 81 MG tablet Take 81 mg by mouth daily.    [provider]  atorvastatin (LIPITOR) 20 MG tablet Take 1 tablet (20 mg total) by mouth daily. 02/03/20 08/01/20  Mercy Riding, MD  blood glucose meter kit and supplies KIT Use to check your blood glucose two to three times a day. Dispense based on patient and insurance preference. Use up to four times daily as directed. (FOR ICD-9 250.00, 250.01).  02/03/20   Mercy Riding, MD  cephALEXin (KEFLEX) 500 MG capsule Take 1 capsule (500 mg total) by mouth 4 (four) times daily. 03/02/20   Isla Pence, MD  Cholecalciferol (VITAMIN D) 125 MCG (5000 UT) CAPS Take 5,000 Units by mouth daily.    [provider]  diltiazem (CARDIZEM CD) 180 MG 24 hr capsule Take 1 capsule (180 mg total) by mouth daily. 02/03/20   Mercy Riding, MD  furosemide (LASIX) 20 MG tablet Take 1 tablet (20 mg total) by mouth daily. 03/02/20   Isla Pence, MD  insulin glargine (LANTUS SOLOSTAR) 100 UNIT/ML Solostar Pen Inject 15 Units into the skin 2 (two) times daily. 02/03/20   Mercy Riding, MD  Insulin Pen Needle (PEN NEEDLES) 31G X 6 MM MISC Use to inject your insulin twice a day 02/03/20   Mercy Riding, MD  metFORMIN (GLUCOPHAGE) 1000 MG tablet Take 1 tablet (1,000 mg total) by mouth 2 (two) times daily with a meal. 02/03/20 08/01/20  Mercy Riding, MD  Multiple Vitamin (MULTIVITAMIN) tablet Take 1 tablet by mouth daily.    [provider]  Omega-3 Fatty Acids (FISH OIL) 1200 MG CAPS Take 1 capsule by mouth in the morning, at noon, and at bedtime.     [provider]  zinc gluconate 50 MG tablet Take 50 mg by mouth daily.    [provider]    Physical Exam: Vitals:   04/17/22 2115 04/17/22 2130 04/17/22 2145 04/17/22 2206  BP: (!) 190/81 (!) 179/92 (!) 186/66   Pulse: 95 90 94   Resp: 18     Temp:    99.2 F (37.3 C)  TempSrc:    Oral  SpO2: 93% 94% 95%   Weight:      Height:       General:  Appears calm and comfortable and is in NAD. Rigors and diaphoretic.  Eyes:  PERRL, EOMI, normal lids, iris ENT:  grossly normal hearing, lips & tongue, dry mucous membranes; appropriate dentition Neck:  no LAD, masses or thyromegaly; no carotid bruits Cardiovascular:  RRR, no m/r/g. No LE edema.  Respiratory:   CTA bilaterally with no wheezes/rales/rhonchi.  Normal respiratory effort. Abdomen:  soft, NT, ND, NABS Back:   normal alignment, no CVAT Skin:  no rash or induration seen on limited exam Musculoskeletal:  grossly normal tone BUE/BLE, good ROM, no bony abnormality Lower extremity:  No LE edema.  Limited foot exam with no ulcerations.  2+ distal pulses. Psychiatric:  grossly normal mood and affect, speech fluent and appropriate, AOx3 Neurologic:  CN 2-12 grossly intact, moves all extremities in coordinated fashion, sensation intact   Radiological Exams on Admission: Independently reviewed - see discussion in A/P where applicable  DG Chest Port 1 View  Result Date: 04/17/2022 CLINICAL DATA:  Fever and fall. EXAM: PORTABLE CHEST 1 VIEW  COMPARISON:  Chest radiograph dated 01/31/2020. FINDINGS: Bibasilar dependent atelectasis. No focal consolidation, pleural effusion, or pneumothorax. The cardiac silhouette is within normal limits. No acute osseous pathology. IMPRESSION: No active disease. Electronically Signed   By: Anner Crete M.D.   On: 04/17/2022 19:19   DG Pelvis Portable  Result Date: 04/17/2022 CLINICAL DATA:  Fever.  Fall.  Weakness for 2 days EXAM: PORTABLE PELVIS 2 VIEWS COMPARISON:  None FINDINGS: Gas is seen in nondilated loops of small and large bowel. Mild degenerative changes seen of the lower lumbar spine. Of the pelvis there is no fracture or dislocation. Preserved joint  spaces and bone mineralization. Only mild sclerosis of the sacroiliac joint on the left. Under penetrated radiographs. IMPRESSION: Mild degenerative changes.  No acute osseous abnormality. Electronically Signed   By: Jill Side M.D.   On: 04/17/2022 19:13    EKG: Independently reviewed.  NSR with rate 94; nonspecific ST changes with no evidence of acute ischemia   Labs on Admission: I have personally reviewed the available labs and imaging studies at the time of the admission.  Pertinent labs:   covid positive wbc: 13.8,  hgb: 12.2, glucose: 358, lactic acid: 2.2,  CK: 1017  Assessment and Plan: Principal Problem:   weakness and sepsis criteria due to COVID-19 Active Problems:   Type 2 diabetes mellitus with stable proliferative retinopathy of both eyes, without long-term current use of insulin (HCC)   Atrial fibrillation (HCC)   Essential hypertension   Dyslipidemia   Iron deficiency anemia   BPH with urinary obstruction   Weakness    Assessment and Plan: * weakness and sepsis criteria due to COVID-[age] year old male presenting with 1-2 day history of fever and profound weakness found to have Covid-19. Sepsis criteria met with temp, WBC and tachycardia, also had elevated lactic acid.  -obs to telemetry -airborne/droplet  precautions -PPE -check covid labs  -CK elevated to 1000, but normal LFTs>IVF overnight  -CXR clear and oxygen >94% on RA -paxlovid per pharmacy dosing  -vitamins/IS to bedside and SABA inhaler prn;however, he is having no respiratory complaints or symptoms.  -NS IVF -PT/OT to eval for weakness   Type 2 diabetes mellitus with stable proliferative retinopathy of both eyes, without long-term current use of insulin (HCC) A1C pending Continue long acting insulin 15 units BID Hold metfromin SSI and accuchecks qac/hs   Atrial fibrillation (HCC) PAF in 11/21 in setting acute illness requiring hospitalization  NSR and followed by cardiology  Was taken off eliquis due to hematuria and anemia  -zio showed no episodes of afib -continue cardizem, monitor on tele  Essential hypertension Above goal, has not had his medication Continue cardizem '180mg'$  daily and lisinopril '40mg'$  daily    Dyslipidemia Unsure if on lipitor,  F/u on med rec  Iron deficiency anemia Stable, continue to monitor   BPH with urinary obstruction Continue flomax Elevated bph, followed by urology     Advance Care Planning:   Code Status: Full Code   Consults: PT/OT  DVT Prophylaxis: lovenox   Family Communication: none   Severity of Illness: The appropriate patient status for this patient is OBSERVATION. Observation status is judged to be reasonable and necessary in order to provide the required intensity of service to ensure the patient's safety. The patient's presenting symptoms, physical exam findings, and initial radiographic and laboratory data in the context of their medical condition is felt to place them at decreased risk for further clinical deterioration. Furthermore, it is anticipated that the patient will be medically stable for discharge from the hospital within 2 midnights of admission.   Author: Orma Flaming, MD 04/17/2022 11:52 PM  For on call review [email or web address].

## 2022-04-17 NOTE — Assessment & Plan Note (Signed)
Continue flomax Elevated bph, followed by urology

## 2022-04-17 NOTE — Assessment & Plan Note (Addendum)
77 year old male presenting with 1-2 day history of fever and profound weakness found to have Covid-19. Sepsis criteria met with temp, WBC and tachycardia, also had elevated lactic acid.  UA also concerning for UTI.  Procalcitonin elevated at 5.36 --Start him on ceftriaxone -Continue with supportive care -PT/OT to eval for weakness

## 2022-04-17 NOTE — ED Provider Notes (Signed)
[facility] Provider Note   CSN: [id number] Arrival date & time: 04/17/22  1749     History  Chief Complaint  Patient presents with   Weakness    David Sharp is a 77 y.o. male history of A-fib on Eliquis, hypertension, diabetes, here presenting with weakness and fever.  Patient states that he got out of bed and slipped and fell next to his bed.  He did not injure his head but he was unable to get up afterwards.  He was noted to be febrile 103 at home.  He states that his wife tested positive for COVID 3 days ago and he did not have any symptoms other than weakness today.  He did not realize he had fever and just had some subjective chills this morning when he fell.  Denies any head injury.  The history is provided by the patient.       Home Medications Prior to Admission medications   Medication Sig Start Date End Date Taking? Authorizing Provider  acetaminophen (TYLENOL) 500 MG tablet Take 1,000 mg by mouth every 6 (six) hours as needed for moderate pain.    [provider]  apixaban (ELIQUIS) 5 MG TABS tablet Take 1 tablet (5 mg total) by mouth 2 (two) times daily. 02/03/20   Mercy Riding, MD  Ascorbic Acid (VITAMIN C) 1000 MG tablet Take 1,000 mg by mouth daily.    [provider]  aspirin EC 81 MG tablet Take 81 mg by mouth daily.    [provider]  atorvastatin (LIPITOR) 20 MG tablet Take 1 tablet (20 mg total) by mouth daily. 02/03/20 08/01/20  Mercy Riding, MD  blood glucose meter kit and supplies KIT Use to check your blood glucose two to three times a day. Dispense based on patient and insurance preference. Use up to four times daily as directed. (FOR ICD-9 250.00, 250.01). 02/03/20   Mercy Riding, MD  cephALEXin (KEFLEX) 500 MG capsule Take 1 capsule (500 mg total) by mouth 4 (four) times daily. 03/02/20   Isla Pence, MD  Cholecalciferol (VITAMIN D) 125 MCG (5000 UT) CAPS Take 5,000 Units by mouth  daily.    [provider]  diltiazem (CARDIZEM CD) 180 MG 24 hr capsule Take 1 capsule (180 mg total) by mouth daily. 02/03/20   Mercy Riding, MD  furosemide (LASIX) 20 MG tablet Take 1 tablet (20 mg total) by mouth daily. 03/02/20   Isla Pence, MD  insulin glargine (LANTUS SOLOSTAR) 100 UNIT/ML Solostar Pen Inject 15 Units into the skin 2 (two) times daily. 02/03/20   Mercy Riding, MD  Insulin Pen Needle (PEN NEEDLES) 31G X 6 MM MISC Use to inject your insulin twice a day 02/03/20   Mercy Riding, MD  metFORMIN (GLUCOPHAGE) 1000 MG tablet Take 1 tablet (1,000 mg total) by mouth 2 (two) times daily with a meal. 02/03/20 08/01/20  Mercy Riding, MD  Multiple Vitamin (MULTIVITAMIN) tablet Take 1 tablet by mouth daily.    [provider]  Omega-3 Fatty Acids (FISH OIL) 1200 MG CAPS Take 1 capsule by mouth in the morning, at noon, and at bedtime.     [provider]  zinc gluconate 50 MG tablet Take 50 mg by mouth daily.    [provider]      Allergies    Amoxicillin    Review of Systems   Review of Systems  Neurological:  Positive  for weakness.  All other systems reviewed and are negative.   Physical Exam Updated Vital Signs BP (!) 144/118   Pulse (!) 102   Temp (!) 100.6 F (38.1 C) (Rectal)   Resp (!) 22   Ht 5' 11.75" (1.822 m)   Wt 107.5 kg   SpO2 95%   BMI 32.37 kg/m  Physical Exam Vitals and nursing note reviewed.  Constitutional:      Comments: Chronically ill and dehydrated  HENT:     Head: Normocephalic and atraumatic.     Nose: Nose normal.     Mouth/Throat:     Mouth: Mucous membranes are dry.  Eyes:     Pupils: Pupils are equal, round, and reactive to light.  Cardiovascular:     Rate and Rhythm: Regular rhythm. Tachycardia present.     Pulses: Normal pulses.     Heart sounds: Normal heart sounds.  Pulmonary:     Comments: Slightly tachypneic and diminished bilateral bases Abdominal:     General: Abdomen is flat.      Palpations: Abdomen is soft.  Musculoskeletal:        General: Normal range of motion.     Cervical back: Normal range of motion and neck supple.  Skin:    General: Skin is warm.     Capillary Refill: Capillary refill takes less than 2 seconds.  Neurological:     General: No focal deficit present.     Mental Status: He is alert.     Comments: Patient strength is 3 out of 5 bilateral arms and legs.  Patient is unable to sit up due to weakness  Psychiatric:        Mood and Affect: Mood normal.        Behavior: Behavior normal.     ED Results / Procedures / Treatments   Labs (all labs ordered are listed, but only abnormal results are displayed) Labs Reviewed  CBC WITH DIFFERENTIAL/PLATELET - Abnormal; Notable for the following components:      Result Value   WBC 13.8 (*)    RBC 4.12 (*)    Hemoglobin 12.2 (*)    HCT 36.9 (*)    Neutro Abs 12.5 (*)    Lymphs Abs 0.3 (*)    All other components within normal limits  CULTURE, BLOOD (ROUTINE X 2)  CULTURE, BLOOD (ROUTINE X 2)  RESP PANEL BY RT-PCR (RSV, FLU A&B, COVID)  [id number]  URINE CULTURE  COMPREHENSIVE METABOLIC PANEL  LACTIC ACID, PLASMA  LACTIC ACID, PLASMA  CK  URINALYSIS, ROUTINE W REFLEX MICROSCOPIC    EKG None  Radiology DG Pelvis Portable  Result Date: 04/17/2022 CLINICAL DATA:  Fever.  Fall.  Weakness for 2 days EXAM: PORTABLE PELVIS 2 VIEWS COMPARISON:  None FINDINGS: Gas is seen in nondilated loops of small and large bowel. Mild degenerative changes seen of the lower lumbar spine. Of the pelvis there is no fracture or dislocation. Preserved joint spaces and bone mineralization. Only mild sclerosis of the sacroiliac joint on the left. Under penetrated radiographs. IMPRESSION: Mild degenerative changes.  No acute osseous abnormality. Electronically Signed   By: Jill Side M.D.   On: 04/17/2022 19:13    Procedures Procedures    Medications Ordered in ED Medications  acetaminophen (TYLENOL) tablet 650 mg  (has no administration in time range)  sodium chloride 0.9 % bolus 1,000 mL (1,000 mLs Intravenous New Bag/Given 04/17/22 1833)    ED Course/ Medical Decision Making/ A&P  Medical Decision Making REVEL STELLMACH is a 77 y.o. male here presenting with weakness.  Patient is febrile at home and wife tested positive for COVID.  Concern for weakness from COVID versus pneumonia versus UTI.  Plan to get CBC CMP and lactate and cultures and chest x-[place] and pelvis x-[place].  Patient has no signs of head injury.  9:40 PM White blood cell count is 13.  Lactate mildly elevated at 2.2 likely from dehydration.  His COVID test is positive which is likely causing his fever.  Chest x-[place] is clear.  Patient is still unable to even sit up.  Patient will be admitted for COVID with weakness.  Patient has no oxygen requirement.  Problems Addressed: COVID-19: acute illness or injury  Amount and/or Complexity of Data Reviewed Labs: ordered. Decision-making details documented in ED Course. Radiology: ordered and independent interpretation performed. Decision-making details documented in ED Course.  Risk OTC drugs.    Final Clinical Impression(s) / ED Diagnoses Final diagnoses:  None    Rx / DC Orders ED Discharge Orders     None         Drenda Freeze, MD 04/17/22 2141

## 2022-04-17 NOTE — ED Triage Notes (Addendum)
Patient has been feeling weak for 2 days. Was on the floor since 11am after slipping out of the bed. Family member has Covid. Fever of 103F at home. Has not taken any medication today.  EMS 20G left forearm 1052m fluid

## 2022-04-17 NOTE — Assessment & Plan Note (Addendum)
A1C at 8.5 Continue long acting insulin 15 units BID Hold metfromin SSI and accuchecks qac/hs

## 2022-04-17 NOTE — Assessment & Plan Note (Signed)
PAF in 11/21 in setting acute illness requiring hospitalization  NSR and followed by cardiology  Was taken off eliquis due to hematuria and anemia  -zio showed no episodes of afib -continue cardizem, monitor on tele

## 2022-04-17 NOTE — Assessment & Plan Note (Addendum)
-  Continue Lipitor °

## 2022-04-17 NOTE — Assessment & Plan Note (Signed)
Above goal, has not had his medication Continue cardizem '180mg'$  daily and lisinopril '40mg'$  daily

## 2022-04-17 NOTE — ED Notes (Signed)
Pt is aware that a urine sample is needed, urinal at bedside. 

## 2022-04-17 NOTE — Progress Notes (Signed)
A consult was received from a physician for Paxlovid per pharmacy dosing.    The patient's profile has been reviewed for ht/wt/allergies/indication/available labs.   CrCl = 73 ml/min  MD has confirmed patient is not currently apixaban.    Plan:  Paxlovid 3 tablets po BID x 5 days Need for further dosage adjustment appears unlikely at present.    Will sign off at this time.  Please reconsult if a change in clinical status warrants re-evaluation of dosage.                   Thank you, Everette Rank, PharmD 04/17/2022  11:28 PM

## 2022-04-18 DIAGNOSIS — A4159 Other Gram-negative sepsis: Secondary | ICD-10-CM | POA: Diagnosis present

## 2022-04-18 DIAGNOSIS — R531 Weakness: Secondary | ICD-10-CM

## 2022-04-18 DIAGNOSIS — A419 Sepsis, unspecified organism: Secondary | ICD-10-CM | POA: Diagnosis not present

## 2022-04-18 DIAGNOSIS — I480 Paroxysmal atrial fibrillation: Secondary | ICD-10-CM | POA: Diagnosis present

## 2022-04-18 DIAGNOSIS — I10 Essential (primary) hypertension: Secondary | ICD-10-CM

## 2022-04-18 DIAGNOSIS — E1165 Type 2 diabetes mellitus with hyperglycemia: Secondary | ICD-10-CM | POA: Diagnosis present

## 2022-04-18 DIAGNOSIS — N132 Hydronephrosis with renal and ureteral calculous obstruction: Secondary | ICD-10-CM | POA: Diagnosis present

## 2022-04-18 DIAGNOSIS — N401 Enlarged prostate with lower urinary tract symptoms: Secondary | ICD-10-CM | POA: Diagnosis present

## 2022-04-18 DIAGNOSIS — Z794 Long term (current) use of insulin: Secondary | ICD-10-CM | POA: Diagnosis not present

## 2022-04-18 DIAGNOSIS — U071 COVID-19: Secondary | ICD-10-CM | POA: Diagnosis present

## 2022-04-18 DIAGNOSIS — E113553 Type 2 diabetes mellitus with stable proliferative diabetic retinopathy, bilateral: Secondary | ICD-10-CM | POA: Diagnosis present

## 2022-04-18 DIAGNOSIS — D509 Iron deficiency anemia, unspecified: Secondary | ICD-10-CM | POA: Diagnosis present

## 2022-04-18 DIAGNOSIS — E785 Hyperlipidemia, unspecified: Secondary | ICD-10-CM

## 2022-04-18 DIAGNOSIS — E669 Obesity, unspecified: Secondary | ICD-10-CM | POA: Diagnosis present

## 2022-04-18 DIAGNOSIS — K8010 Calculus of gallbladder with chronic cholecystitis without obstruction: Secondary | ICD-10-CM | POA: Diagnosis present

## 2022-04-18 DIAGNOSIS — E860 Dehydration: Secondary | ICD-10-CM | POA: Diagnosis present

## 2022-04-18 DIAGNOSIS — A4189 Other specified sepsis: Secondary | ICD-10-CM | POA: Diagnosis present

## 2022-04-18 DIAGNOSIS — M6282 Rhabdomyolysis: Secondary | ICD-10-CM | POA: Diagnosis present

## 2022-04-18 DIAGNOSIS — N138 Other obstructive and reflux uropathy: Secondary | ICD-10-CM | POA: Diagnosis present

## 2022-04-18 DIAGNOSIS — I4811 Longstanding persistent atrial fibrillation: Secondary | ICD-10-CM | POA: Diagnosis not present

## 2022-04-18 DIAGNOSIS — R6520 Severe sepsis without septic shock: Secondary | ICD-10-CM | POA: Diagnosis present

## 2022-04-18 DIAGNOSIS — Z6832 Body mass index (BMI) 32.0-32.9, adult: Secondary | ICD-10-CM | POA: Diagnosis not present

## 2022-04-18 DIAGNOSIS — I4891 Unspecified atrial fibrillation: Secondary | ICD-10-CM | POA: Diagnosis not present

## 2022-04-18 DIAGNOSIS — E871 Hypo-osmolality and hyponatremia: Secondary | ICD-10-CM | POA: Diagnosis not present

## 2022-04-18 DIAGNOSIS — N179 Acute kidney failure, unspecified: Secondary | ICD-10-CM | POA: Diagnosis not present

## 2022-04-18 DIAGNOSIS — L89152 Pressure ulcer of sacral region, stage 2: Secondary | ICD-10-CM | POA: Diagnosis present

## 2022-04-18 DIAGNOSIS — R7881 Bacteremia: Secondary | ICD-10-CM | POA: Diagnosis not present

## 2022-04-18 LAB — HEMOGLOBIN A1C
Hgb A1c MFr Bld: 8.5 % — ABNORMAL HIGH (ref 4.8–5.6)
Mean Plasma Glucose: 197.25 mg/dL

## 2022-04-18 LAB — CBG MONITORING, ED
Glucose-Capillary: 329 mg/dL — ABNORMAL HIGH (ref 70–99)
Glucose-Capillary: 279 mg/dL — ABNORMAL HIGH (ref 70–99)

## 2022-04-18 LAB — COMPREHENSIVE METABOLIC PANEL
ALT: 32 U/L (ref 0–44)
AST: 88 U/L — ABNORMAL HIGH (ref 15–41)
Albumin: 3.8 g/dL (ref 3.5–5.0)
Alkaline Phosphatase: 69 U/L (ref 38–126)
Anion gap: 7 (ref 5–15)
BUN: 21 mg/dL (ref 8–23)
CO2: 23 mmol/L (ref 22–32)
Calcium: 8.7 mg/dL — ABNORMAL LOW (ref 8.9–10.3)
Chloride: 103 mmol/L (ref 98–111)
Creatinine, Ser: 1.26 mg/dL — ABNORMAL HIGH (ref 0.61–1.24)
GFR, Estimated: 60 mL/min — ABNORMAL LOW (ref >60)
Glucose, Bld: 354 mg/dL — ABNORMAL HIGH (ref 70–99)
Potassium: 3.7 mmol/L (ref 3.5–5.1)
Sodium: 133 mmol/L — ABNORMAL LOW (ref 135–145)
Total Bilirubin: 1.1 mg/dL (ref 0.3–1.2)
Total Protein: 7.1 g/dL (ref 6.5–8.1)

## 2022-04-18 LAB — CBC WITH DIFFERENTIAL/PLATELET
Abs Immature Granulocytes: 0.05 10*3/uL (ref 0.00–0.07)
Basophils Absolute: 0 10*3/uL (ref 0.0–0.1)
Basophils Relative: 0 %
Eosinophils Absolute: 0 10*3/uL (ref 0.0–0.5)
Eosinophils Relative: 0 %
HCT: 38.2 % — ABNORMAL LOW (ref 39.0–52.0)
Hemoglobin: 12.7 g/dL — ABNORMAL LOW (ref 13.0–17.0)
Immature Granulocytes: 0 %
Lymphocytes Relative: 2 %
Lymphs Abs: 0.3 10*3/uL — ABNORMAL LOW (ref 0.7–4.0)
MCH: 29.7 pg (ref 26.0–34.0)
MCHC: 33.2 g/dL (ref 30.0–36.0)
MCV: 89.5 fL (ref 80.0–100.0)
Monocytes Absolute: 0.7 10*3/uL (ref 0.1–1.0)
Monocytes Relative: 5 %
Neutro Abs: 13.2 10*3/uL — ABNORMAL HIGH (ref 1.7–7.7)
Neutrophils Relative %: 93 %
Platelets: 159 10*3/uL (ref 150–400)
RBC: 4.27 MIL/uL (ref 4.22–5.81)
RDW: 14.5 % (ref 11.5–15.5)
WBC: 14.3 10*3/uL — ABNORMAL HIGH (ref 4.0–10.5)
nRBC: 0 % (ref 0.0–0.2)

## 2022-04-18 LAB — URINALYSIS, ROUTINE W REFLEX MICROSCOPIC
Bilirubin Urine: NEGATIVE
Glucose, UA: >=500 mg/dL — AB
Ketones, ur: 20 mg/dL — AB
Nitrite: NEGATIVE
Protein, ur: 100 mg/dL — AB
RBC / HPF: >50 RBC/hpf — ABNORMAL HIGH (ref 0–5)
Specific Gravity, Urine: >1.046 — ABNORMAL HIGH (ref 1.005–1.030)
pH: 5 (ref 5.0–8.0)

## 2022-04-18 LAB — C-REACTIVE PROTEIN: CRP: 7.8 mg/dL — ABNORMAL HIGH (ref <1.0)

## 2022-04-18 LAB — CK: Total CK: 4876 U/L — ABNORMAL HIGH (ref 49–397)

## 2022-04-18 LAB — D-DIMER, QUANTITATIVE: D-Dimer, Quant: 1.43 ug/mL-FEU — ABNORMAL HIGH (ref 0.00–0.50)

## 2022-04-18 LAB — PROCALCITONIN: Procalcitonin: 5.36 ng/mL

## 2022-04-18 LAB — GLUCOSE, CAPILLARY
Glucose-Capillary: 305 mg/dL — ABNORMAL HIGH (ref 70–99)
Glucose-Capillary: 314 mg/dL — ABNORMAL HIGH (ref 70–99)

## 2022-04-18 LAB — TROPONIN I (HIGH SENSITIVITY)
Troponin I (High Sensitivity): 49 ng/L — ABNORMAL HIGH (ref <18)
Troponin I (High Sensitivity): 122 ng/L (ref <18)

## 2022-04-18 LAB — FERRITIN: Ferritin: 61 ng/mL (ref 24–336)

## 2022-04-18 LAB — BRAIN NATRIURETIC PEPTIDE: B Natriuretic Peptide: 237.8 pg/mL — ABNORMAL HIGH (ref 0.0–100.0)

## 2022-04-18 LAB — LACTIC ACID, PLASMA
Lactic Acid, Venous: 2.6 mmol/L (ref 0.5–1.9)
Lactic Acid, Venous: 1.9 mmol/L (ref 0.5–1.9)

## 2022-04-18 LAB — FIBRINOGEN: Fibrinogen: 538 mg/dL — ABNORMAL HIGH (ref 210–475)

## 2022-04-18 MED ORDER — ATORVASTATIN CALCIUM 10 MG PO TABS: 20.0000 mg | ORAL_TABLET | Freq: Every day | ORAL | Status: DC

## 2022-04-18 MED ORDER — INSULIN ASPART 100 UNIT/ML IJ SOLN
4.0000 [IU] | Freq: Three times a day (TID) | INTRAMUSCULAR | Status: DC
  Administered 2022-04-18 – 2022-04-19 (×2): 4 [IU] via SUBCUTANEOUS
  Filled 2022-04-18: qty 0.04

## 2022-04-18 MED ORDER — TAMSULOSIN HCL 0.4 MG PO CAPS
0.4000 mg | ORAL_CAPSULE | Freq: Every day | ORAL | Status: DC
  Filled 2022-04-18: qty 1

## 2022-04-18 MED ORDER — SODIUM CHLORIDE 0.9 % IV SOLN
2.0000 g | INTRAVENOUS | Status: DC
  Administered 2022-04-18 – 2022-04-22 (×5): 2 g via INTRAVENOUS
  Filled 2022-04-18 (×5): qty 20

## 2022-04-18 NOTE — Progress Notes (Signed)
Progress Note   Patient: David Sharp AOZ:[id number] DOB: 1945-09-10 DOA: 04/17/2022     0 DOS: the patient was seen and examined on 04/18/2022   Brief hospital course: Taken from H&P.  David Sharp is a 77 y.o. male with medical history significant of T2DM, IDA, HLD, HTN, BPH, afib who presented to ED with complaints of weakness and fever.  This morning his son could not get him up out of bed. He had slid down on the floor and his son could not get him up. His wife tested positive on Tuesday. He had a fever this morning at his house to 103. He has no cough or congestion. He has no body aches. He states he took the last pfizer booster in 11/2021 with his flu shot.    ER Course:  vitals: tem: 98.2>100.6, bp: 178/79, HR: 101, oxygen: 93% on RA Pertinent labs: covid positive, wbc: 13.8, hgb: 12.2, glucose: 358, lactic acid: 2.2, CK: 1017 CXR: no active disease.  He was started on Paxlovid  1/20: Febrile this morning.  Patient met sepsis criteria on admission with fever, leukocytosis, tachycardia and tachypnea.  UA with leukocytosis and microscopic hematuria, blood cultures and urine cultures pending.  Procalcitonin elevated at 5.36.  D-dimer 1.43, CRP elevated at 7.8 Lactic acidosis resolved. Starting him on ceftriaxone while waiting for urine culture.    Assessment and Plan: * weakness and sepsis criteria due to COVID-73 77 year old male presenting with 1-2 day history of fever and profound weakness found to have Covid-19. Sepsis criteria met with temp, WBC and tachycardia, also had elevated lactic acid.  UA also concerning for UTI.  Procalcitonin elevated at 5.36 --Start him on ceftriaxone -Continue with supportive care -PT/OT to eval for weakness   Type 2 diabetes mellitus with stable proliferative retinopathy of both eyes, without long-term current use of insulin (HCC) A1C at 8.5 Continue long acting insulin 15 units BID Hold metfromin SSI and accuchecks qac/hs   Atrial fibrillation  (HCC) PAF in 11/21 in setting acute illness requiring hospitalization  NSR and followed by cardiology  Was taken off eliquis due to hematuria and anemia  -zio showed no episodes of afib -continue cardizem, monitor on tele  Essential hypertension Above goal, has not had his medication Continue cardizem '180mg'$  daily and lisinopril '40mg'$  daily    Dyslipidemia -Continue Lipitor  Iron deficiency anemia Stable, continue to monitor   BPH with urinary obstruction Continue flomax Elevated bph, followed by urology   Weakness -PT/OT evaluation   Subjective: Patient continued to feel weak.  Having some lower abdominal discomfort and no other urinary symptoms.  Physical Exam: Vitals:   04/18/22 1130 04/18/22 1230 04/18/22 1312 04/18/22 1336  BP: (!) 153/72 (!) 163/65  (!) 123/59  Pulse: 73 72  80  Resp: 16 (!) 24  (!) 22  Temp:   99.4 F (37.4 C) (!) 101.6 F (38.7 C)  TempSrc:   Axillary Oral  SpO2: 96% 94%  94%  Weight:      Height:       General.  Overweight elderly man, in no acute distress. Pulmonary.  Lungs clear bilaterally, normal respiratory effort. CV.  Regular rate and rhythm, no JVD, rub or murmur. Abdomen.  Soft, nontender, nondistended, BS positive. CNS.  Alert and oriented .  No focal neurologic deficit. Extremities.  No edema, no cyanosis, pulses intact and symmetrical. Psychiatry.  Judgment and insight appears normal.   Data Reviewed: Prior data reviewed  Family Communication: Talked with  wife on phone  Disposition: Status is: Observation The patient will require care spanning > 2 midnights and should be moved to inpatient because: Severity of illness  Planned Discharge Destination: Home   Time spent: 50 minutes  This record has been created using Systems analyst. Errors have been sought and corrected,but may not always be located. Such creation errors do not reflect on the standard of care.   Author: Lorella Nimrod, MD 04/18/2022  2:24 PM  For on call review [email or web address].

## 2022-04-18 NOTE — Progress Notes (Signed)
PT Cancellation Note  Patient Details Name: David Sharp MRN: [id number] DOB: Apr 29, 1945   Cancelled Treatment:    Reason Eval/Treat Not Completed: (P) Patient not medically ready; elevated troponin (122, MD notified by RN). NT also communicated that during recent care pt shaking with fatigue/effort, +2 for rolling in bed, and elevated BP. Will check back as schedule and pt status allow which may be another day.  Coolidge Breeze, PT, DPT [facility] Office: [phone] Weekend pager: [phone]   Coolidge Breeze 04/18/2022, 3:51 PM

## 2022-04-18 NOTE — Progress Notes (Signed)
Critical Lab Troponin 122  MD made aware

## 2022-04-18 NOTE — ED Notes (Signed)
ED TO INPATIENT HANDOFF REPORT  ED Nurse Name and Phone #: Monchel Pollitt [phone]  S Name/Age/Gender David Sharp 77 y.o. male Room/Bed: WA14/WA14  Code Status   Code Status: Full Code  Home/SNF/Other Home Patient oriented to: A&OX4 Is this baseline? Yes   Triage Complete: Triage complete  Chief Complaint COVID-19 [U07.1]  Triage Note Patient has been feeling weak for 2 days. Was on the floor since 11am after slipping out of the bed. Family member has Covid. Fever of 103F at home. Has not taken any medication today.  EMS 20G left forearm 1049m fluid   Allergies Allergies  Allergen Reactions   Amoxicillin Hives and Itching    Level of Care/Admitting Diagnosis ED Disposition     ED Disposition  Admit   Condition  --   Comment  [facility][id number]] Level of Care: Telemetry [5]  Admit to tele based on following criteria: Complex arrhythmia (Bradycardia/Tachycardia)  [facility]:: Yes  Covid Evaluation: Confirmed COVID Positive  Diagnosis: COVID-19 [[[id number]] Admitting Physician: WOrma Flaming[[[id number]] Attending Physician: WOrma Flaming[[[id number]]         B Medical/Surgery History Past Medical History:  Diagnosis Date   Acute urinary retention    Arthritis    BPH (benign prostatic hyperplasia)    Foley catheter in place    GERD (gastroesophageal reflux disease)    Hyperlipidemia    Hypertension    Type 2 diabetes mellitus ([place]    Wears glasses    Past Surgical History:  Procedure Laterality Date   CATARACT EXTRACTION W/ INTRAOCULAR LENS IMPLANT Right 1990's   INGUINAL HERNIA REPAIR Right 2007  approx   RETINAL DETACHMENT SURGERY Bilateral last one , left eye 1997/  right eye 1990's   TRANSURETHRAL RESECTION OF PROSTATE N/A 01/27/2016   Procedure: TRANSURETHRAL RESECTION OF THE PROSTATE (TURP) WITH GYRUS;  Surgeon:  MKathie Rhodes MD;  [facility]  Service: Urology;  Laterality: N/A;     A IV Location/Drains/Wounds Patient Lines/Drains/Airways Status     Active Line/Drains/Airways     Name Placement date Placement time Site Days   Peripheral IV 04/17/22 20 G Anterior;Distal;Left Forearm 04/17/22  1754  Forearm  1   Incision (Closed) 01/27/16 Penis Other (Comment) 01/27/16  1158  -- 2273            Intake/Output Last 24 hours  Intake/Output Summary (Last 24 hours) at 04/18/2022 1216 Last data filed at 04/17/2022 2040 Gross per 24 hour  Intake 1000 ml  Output --  Net 1000 ml    Labs/Imaging Results for orders placed or performed during the hospital encounter of 04/17/22 (from the past 48 hour(s))  Blood culture (routine x 2)     Status: None (Preliminary result)   Collection Time: 04/17/22  5:56 PM   Specimen: BLOOD  Result Value Ref Range   Specimen Description      BLOOD BLOOD RIGHT WRIST Performed at [facility], [place] [address]   Special Requests      BOTTLES DRAWN AEROBIC AND ANAEROBIC Blood Culture results may not be optimal due to an inadequate volume of blood received in culture bottles Performed at [facility] [address] [place] [address]   Culture      NO GROWTH < 12 HOURS Performed at [facility], [place] [address], [place], [place] [address]    Report Status PENDING   Blood culture (routine x 2)     Status: None (Preliminary result)   Collection Time: 04/17/22  6:01 PM   Specimen: BLOOD  Result Value Ref Range   Specimen Description      BLOOD BLOOD LEFT FOREARM Performed at [facility], [place] [address] [place], [place] [address]    Special Requests      BOTTLES DRAWN AEROBIC AND ANAEROBIC Blood Culture results may not be optimal due to an inadequate volume of blood received in culture bottles Performed at [facility], [place] [address], [place], [place] [address]    Culture      NO GROWTH < 12 HOURS Performed at [facility] [address] [place], [place] [address]    Report Status PENDING   Resp panel by RT-PCR (RSV, Flu A&B, Covid) Anterior Nasal Swab     Status: Abnormal   Collection Time: 04/17/22  6:01 PM   Specimen: Anterior Nasal Swab  Result Value Ref Range   SARS Coronavirus 2 by RT PCR POSITIVE (A) NEGATIVE    Comment: (NOTE) SARS-CoV-2 target nucleic acids are DETECTED.  The SARS-CoV-2 RNA is generally detectable in upper respiratory specimens during the acute phase of infection. Positive results are indicative of the presence of the identified virus, but do not rule out bacterial infection or co-infection with other pathogens not detected by the test. Clinical correlation with patient history and other diagnostic information is necessary to determine patient infection status. The expected result is Negative.  Fact Sheet for Patients: EntrepreneurPulse.com.au  Fact Sheet for Healthcare Providers: IncredibleEmployment.be  This test is not yet approved or cleared by the [place] FDA and  has been authorized for detection and/or diagnosis of SARS-CoV-2 by FDA under an Emergency Use Authorization (EUA).  This EUA will remain in effect (meaning this test can be used) for the duration of  the COVID-19 declaration under Section 564(b)(1) of the A ct, 21 U.S.C. section 360bbb-3(b)(1), unless the authorization is terminated or revoked sooner.     Influenza A by PCR NEGATIVE NEGATIVE   Influenza B by PCR NEGATIVE NEGATIVE    Comment: (NOTE) The Xpert Xpress SARS-CoV-2/FLU/RSV plus assay is intended as an aid in the diagnosis of influenza from Nasopharyngeal swab specimens and should not be used as a sole basis for treatment. Nasal washings and aspirates are unacceptable for Xpert Xpress SARS-CoV-2/FLU/RSV testing.  Fact Sheet  for Patients: EntrepreneurPulse.com.au  Fact Sheet for Healthcare Providers: IncredibleEmployment.be  This test is not yet approved or cleared by the [place] FDA and has been authorized for detection and/or diagnosis of SARS-CoV-2 by FDA under an Emergency Use Authorization (EUA). This EUA will remain in effect (meaning this test can be used) for the duration of the COVID-19 declaration under Section 564(b)(1) of the Act, 21 U.S.C. section 360bbb-3(b)(1), unless the authorization is terminated or revoked.     Resp Syncytial Virus by PCR NEGATIVE NEGATIVE    Comment: (NOTE) Fact Sheet for Patients: EntrepreneurPulse.com.au  Fact Sheet for Healthcare Providers: IncredibleEmployment.be  This test is not yet approved or cleared by the [place] FDA and has been authorized for detection and/or diagnosis of SARS-CoV-2 by FDA under an Emergency Use Authorization (EUA). This EUA will remain in effect (meaning this test can be used) for the duration of the COVID-19 declaration under Section 564(b)(1) of the Act, 21 U.S.C.  section 360bbb-3(b)(1), unless the authorization is terminated or revoked.  Performed at [facility], [place] [address], [place], [place] [address]   CBC with Differential     Status: Abnormal   Collection Time: 04/17/22  7:03 PM  Result Value Ref Range   WBC 13.8 (H) 4.0 - 10.5 K/uL   RBC 4.12 (L) 4.22 - 5.81 MIL/uL   Hemoglobin 12.2 (L) 13.0 - 17.0 g/dL   HCT 36.9 (L) 39.0 - 52.0 %   MCV 89.6 80.0 - 100.0 fL   MCH 29.6 26.0 - 34.0 pg   MCHC 33.1 30.0 - 36.0 g/dL   RDW 14.4 11.5 - 15.5 %   Platelets 184 150 - 400 K/uL   nRBC 0.0 0.0 - 0.2 %   Neutrophils Relative % 90 %   Neutro Abs 12.5 (H) 1.7 - 7.7 K/uL   Lymphocytes Relative 2 %   Lymphs Abs 0.3 (L) 0.7 - 4.0 K/uL   Monocytes Relative 7 %   Monocytes Absolute 0.9 0.1 - 1.0 K/uL   Eosinophils Relative 0 %    Eosinophils Absolute 0.0 0.0 - 0.5 K/uL   Basophils Relative 0 %   Basophils Absolute 0.1 0.0 - 0.1 K/uL   Immature Granulocytes 1 %   Abs Immature Granulocytes 0.07 0.00 - 0.07 K/uL    Comment: Performed at [facility], [place] [address] [place], [place] [address]  Comprehensive metabolic panel     Status: Abnormal   Collection Time: 04/17/22  7:03 PM  Result Value Ref Range   Sodium 136 135 - 145 mmol/L   Potassium 4.0 3.5 - 5.1 mmol/L   Chloride 105 98 - 111 mmol/L   CO2 21 (L) 22 - 32 mmol/L   Glucose, Bld 358 (H) 70 - 99 mg/dL    Comment: Glucose reference range applies only to samples taken after fasting for at least 8 hours.   BUN 20 8 - 23 mg/dL   Creatinine, Ser 1.11 0.61 - 1.24 mg/dL   Calcium 8.8 (L) 8.9 - 10.3 mg/dL   Total Protein 7.1 6.5 - 8.1 g/dL   Albumin 3.9 3.5 - 5.0 g/dL   AST 38 15 - 41 U/L   ALT 19 0 - 44 U/L   Alkaline Phosphatase 70 38 - 126 U/L   Total Bilirubin 0.9 0.3 - 1.2 mg/dL   GFR, Estimated >60 >60 mL/min    Comment: (NOTE) Calculated using the CKD-EPI Creatinine Equation (2021)    Anion gap 10 5 - 15    Comment: Performed at [facility], [place] [address], [place], [place] [address]  Lactic acid, plasma     Status: Abnormal   Collection Time: 04/17/22  7:03 PM  Result Value Ref Range   Lactic Acid, Venous 2.2 (HH) 0.5 - 1.9 mmol/L    Comment: CRITICAL RESULT CALLED TO, READ BACK BY AND VERIFIED WITH [place],J AT 2043 O 04/17/22 BY LUZOLOP Performed at [facility], [place] [address], [place], [place] [address]   CK     Status: Abnormal   Collection Time: 04/17/22  7:03 PM  Result Value Ref Range   Total CK 1,017 (H) 49 - 397 U/L    Comment: Performed at [facility], [place] [address], [place], [place] [address]  Hemoglobin A1c     Status: Abnormal   Collection Time: 04/17/22  9:41 PM  Result Value Ref Range   Hgb A1c MFr Bld 8.5 (H) 4.8 - 5.6 %  Comment:  (NOTE) Pre diabetes:          5.7%-6.4%  Diabetes:              >6.4%  Glycemic control for   <7.0% adults with diabetes    Mean Plasma Glucose 197.25 mg/dL    Comment: Performed at [facility], [place] [address] [place], [place] [address]  CBG monitoring, ED     Status: Abnormal   Collection Time: 04/17/22  9:59 PM  Result Value Ref Range   Glucose-Capillary 402 (H) 70 - 99 mg/dL    Comment: Glucose reference range applies only to samples taken after fasting for at least 8 hours.  Lactic acid, plasma     Status: Abnormal   Collection Time: 04/17/22 11:20 PM  Result Value Ref Range   Lactic Acid, Venous 2.6 (HH) 0.5 - 1.9 mmol/L    Comment: CRITICAL VALUE NOTED. VALUE IS CONSISTENT WITH PREVIOUSLY REPORTED/CALLED VALUE Performed at [place] [address], [place], [place] [address]   Brain natriuretic peptide     Status: Abnormal   Collection Time: 04/18/22 12:02 AM  Result Value Ref Range   B Natriuretic Peptide 237.8 (H) 0.0 - 100.0 pg/mL    Comment: Performed at [facility], [place] [address] [place], [place] [address]  C-reactive protein     Status: Abnormal   Collection Time: 04/18/22 12:02 AM  Result Value Ref Range   CRP 7.8 (H) <1.0 mg/dL    Comment: Performed at [place] [address] [place], [place] [address]  D-dimer, quantitative     Status: Abnormal   Collection Time: 04/18/22 12:02 AM  Result Value Ref Range   D-Dimer, Quant 1.43 (H) 0.00 - 0.50 ug/mL-FEU    Comment: (NOTE) At the manufacturer cut-off value of 0.5 g/mL FEU, this assay has a negative predictive value of 95-100%.This assay is intended for use in conjunction with a clinical pretest probability (PTP) assessment model to exclude pulmonary embolism (PE) and deep venous thrombosis (DVT) in outpatients suspected of PE or DVT. Results should be correlated with clinical presentation. Performed at [facility], [place]  [address] [place], [place] [address]   Ferritin     Status: None   Collection Time: 04/18/22 12:02 AM  Result Value Ref Range   Ferritin 61 24 - 336 ng/mL    Comment: Performed at [facility], [place] [address] [place], [place] [address]  Fibrinogen     Status: Abnormal   Collection Time: 04/18/22 12:02 AM  Result Value Ref Range   Fibrinogen 538 (H) 210 - 475 mg/dL    Comment: (NOTE) Fibrinogen results may be underestimated in patients receiving thrombolytic therapy. Performed at [facility], [place] [address], [place], [place] [address]   Troponin I (High Sensitivity)     Status: Abnormal   Collection Time: 04/18/22 12:02 AM  Result Value Ref Range   Troponin I (High Sensitivity) 49 (H) <18 ng/L    Comment: (NOTE) Elevated high sensitivity troponin I (hsTnI) values and significant  changes across serial measurements may suggest ACS but many other  chronic and acute conditions are known to elevate hsTnI results.  Refer to the "Links" section for chest pain algorithms and additional  guidance. Performed at [facility], [place] [address] [place], [place] [address]   Procalcitonin     Status: None   Collection Time: 04/18/22 12:02 AM  Result Value Ref  Range   Procalcitonin 5.36 ng/mL    Comment:        Interpretation: PCT > 2 ng/mL: Systemic infection (sepsis) is likely, unless other causes are known. (NOTE)       Sepsis PCT Algorithm           Lower Respiratory Tract                                      Infection PCT Algorithm    ----------------------------     ----------------------------         PCT < 0.25 ng/mL                PCT < 0.10 ng/mL          Strongly encourage             Strongly discourage   discontinuation of antibiotics    initiation of antibiotics    ----------------------------     -----------------------------       PCT 0.25 - 0.50 ng/mL            PCT 0.10 - 0.25 ng/mL               OR        >80% decrease in PCT            Discourage initiation of                                            antibiotics      Encourage discontinuation           of antibiotics    ----------------------------     -----------------------------         PCT >= 0.50 ng/mL              PCT 0.26 - 0.50 ng/mL               AND       <80% decrease in PCT              Encourage initiation of                                             antibiotics       Encourage continuation           of antibiotics    ----------------------------     -----------------------------        PCT >= 0.50 ng/mL                  PCT > 0.50 ng/mL               AND         increase in PCT                  Strongly encourage                                      initiation of antibiotics    Strongly encourage escalation           of antibiotics                                     -----------------------------  PCT <= 0.25 ng/mL                                                 OR                                        > 80% decrease in PCT                                      Discontinue / Do not initiate                                             antibiotics  Performed at [place] [address] [place], [place] [address]   Urinalysis, Routine w reflex microscopic Urine, Clean Catch     Status: Abnormal   Collection Time: 04/18/22  3:14 AM  Result Value Ref Range   Color, Urine AMBER (A) YELLOW    Comment: BIOCHEMICALS MAY BE AFFECTED BY COLOR   APPearance CLOUDY (A) CLEAR   Specific Gravity, Urine >1.046 (H) 1.005 - 1.030   pH 5.0 5.0 - 8.0   Glucose, UA >=500 (A) NEGATIVE mg/dL   Hgb urine dipstick LARGE (A) NEGATIVE   Bilirubin Urine NEGATIVE NEGATIVE   Ketones, ur 20 (A) NEGATIVE mg/dL   Protein, ur 100 (A) NEGATIVE mg/dL   Nitrite NEGATIVE NEGATIVE   Leukocytes,Ua MODERATE (A) NEGATIVE   RBC / HPF >50 (H) 0 - 5 RBC/hpf   WBC, UA 21-50 0 - 5  WBC/hpf   Bacteria, UA RARE (A) NONE SEEN   Squamous Epithelial / HPF 6-10 0 - 5 /HPF   Budding Yeast PRESENT     Comment: Performed at [facility], [place] [address], [place], [place] [address]  CBC with Differential/Platelet     Status: Abnormal   Collection Time: 04/18/22  3:20 AM  Result Value Ref Range   WBC 14.3 (H) 4.0 - 10.5 K/uL   RBC 4.27 4.22 - 5.81 MIL/uL   Hemoglobin 12.7 (L) 13.0 - 17.0 g/dL   HCT 38.2 (L) 39.0 - 52.0 %   MCV 89.5 80.0 - 100.0 fL   MCH 29.7 26.0 - 34.0 pg   MCHC 33.2 30.0 - 36.0 g/dL   RDW 14.5 11.5 - 15.5 %   Platelets 159 150 - 400 K/uL   nRBC 0.0 0.0 - 0.2 %   Neutrophils Relative % 93 %   Neutro Abs 13.2 (H) 1.7 - 7.7 K/uL   Lymphocytes Relative 2 %   Lymphs Abs 0.3 (L) 0.7 - 4.0 K/uL   Monocytes Relative 5 %   Monocytes Absolute 0.7 0.1 - 1.0 K/uL   Eosinophils Relative 0 %   Eosinophils Absolute 0.0 0.0 - 0.5 K/uL   Basophils Relative 0 %   Basophils Absolute 0.0 0.0 - 0.1 K/uL   Immature Granulocytes 0 %   Abs Immature Granulocytes 0.05 0.00 - 0.07 K/uL    Comment: Performed at [facility], [place] [address] [place], [place] [address]  Comprehensive metabolic panel  Status: Abnormal   Collection Time: 04/18/22  3:20 AM  Result Value Ref Range   Sodium 133 (L) 135 - 145 mmol/L   Potassium 3.7 3.5 - 5.1 mmol/L   Chloride 103 98 - 111 mmol/L   CO2 23 22 - 32 mmol/L   Glucose, Bld 354 (H) 70 - 99 mg/dL    Comment: Glucose reference range applies only to samples taken after fasting for at least 8 hours.   BUN 21 8 - 23 mg/dL   Creatinine, Ser 1.26 (H) 0.61 - 1.24 mg/dL   Calcium 8.7 (L) 8.9 - 10.3 mg/dL   Total Protein 7.1 6.5 - 8.1 g/dL   Albumin 3.8 3.5 - 5.0 g/dL   AST 88 (H) 15 - 41 U/L   ALT 32 0 - 44 U/L   Alkaline Phosphatase 69 38 - 126 U/L   Total Bilirubin 1.1 0.3 - 1.2 mg/dL   GFR, Estimated 60 (L) >60 mL/min    Comment: (NOTE) Calculated using the CKD-EPI Creatinine Equation (2021)     Anion gap 7 5 - 15    Comment: Performed at [facility], [place] [address] [place], [place] [address]  CBG monitoring, ED     Status: Abnormal   Collection Time: 04/18/22  7:24 AM  Result Value Ref Range   Glucose-Capillary 329 (H) 70 - 99 mg/dL    Comment: Glucose reference range applies only to samples taken after fasting for at least 8 hours.  Lactic acid, plasma     Status: None   Collection Time: 04/18/22  9:46 AM  Result Value Ref Range   Lactic Acid, Venous 1.9 0.5 - 1.9 mmol/L    Comment: Performed at [facility], [place] [address], [place], [place] [address]   DG Chest Port 1 View  Result Date: 04/17/2022 CLINICAL DATA:  Fever and fall. EXAM: PORTABLE CHEST 1 VIEW COMPARISON:  Chest radiograph dated 01/31/2020. FINDINGS: Bibasilar dependent atelectasis. No focal consolidation, pleural effusion, or pneumothorax. The cardiac silhouette is within normal limits. No acute osseous pathology. IMPRESSION: No active disease. Electronically Signed   By: Anner Crete M.D.   On: 04/17/2022 19:19   DG Pelvis Portable  Result Date: 04/17/2022 CLINICAL DATA:  Fever.  Fall.  Weakness for 2 days EXAM: PORTABLE PELVIS 2 VIEWS COMPARISON:  None FINDINGS: Gas is seen in nondilated loops of small and large bowel. Mild degenerative changes seen of the lower lumbar spine. Of the pelvis there is no fracture or dislocation. Preserved joint spaces and bone mineralization. Only mild sclerosis of the sacroiliac joint on the left. Under penetrated radiographs. IMPRESSION: Mild degenerative changes.  No acute osseous abnormality. Electronically Signed   By: Jill Side M.D.   On: 04/17/2022 19:13    Pending Labs Unresulted Labs (From admission, onward)     Start     Ordered   04/18/22 0900  CK  Add-on,   AD        04/18/22 0859   04/18/22 0500  CBC with Differential/Platelet  Daily,   R      04/17/22 2334   04/18/22 0500  Comprehensive metabolic panel  Daily,   R       04/17/22 2334   04/17/22 1757  Urine Culture  (Urine Culture)  Once,   URGENT       Question:  Indication  Answer:  Dysuria   04/17/22 1756            Vitals/Pain Today's Vitals  04/18/22 1045 04/18/22 1100 04/18/22 1112 04/18/22 1130  BP: (!) 147/63 (!) 119/59  (!) 153/72  Pulse: 80 77  73  Resp: 19 (!) 23  16  Temp:      TempSrc:      SpO2: 95% 94%  96%  Weight:      Height:      PainSc:   3      Isolation Precautions Airborne and Contact precautions  Medications Medications  insulin aspart (novoLOG) injection 0-15 Units (11 Units Subcutaneous Given 04/18/22 0819)  insulin glargine-yfgn (SEMGLEE) injection 15 Units (15 Units Subcutaneous Given 04/18/22 0926)  0.9 %  sodium chloride infusion ( Intravenous New Bag/Given 04/18/22 0827)  nirmatrelvir/ritonavir (PAXLOVID) 3 tablet (3 tablets Oral Given 04/18/22 0924)  tamsulosin (FLOMAX) capsule 0.4 mg (0.4 mg Oral Given 04/17/22 2344)  lisinopril (ZESTRIL) tablet 40 mg (40 mg Oral Given 04/18/22 0923)  aspirin EC tablet 81 mg (81 mg Oral Given 04/18/22 0922)  diltiazem (CARDIZEM CD) 24 hr capsule 180 mg (180 mg Oral Given 04/18/22 0922)  cholecalciferol (VITAMIN D3) 25 MCG (1000 UNIT) tablet 5,000 Units (5,000 Units Oral Given 04/18/22 2081)  multivitamin with minerals tablet 1 tablet (1 tablet Oral Given 04/18/22 0922)  enoxaparin (LOVENOX) injection 40 mg (40 mg Subcutaneous Given 04/17/22 2350)  albuterol (VENTOLIN HFA) 108 (90 Base) MCG/ACT inhaler 2 puff (has no administration in time range)  guaiFENesin-dextromethorphan (ROBITUSSIN DM) 100-10 MG/5ML syrup 10 mL (has no administration in time range)  ascorbic acid (VITAMIN C) tablet 500 mg (500 mg Oral Given 04/18/22 3887)  zinc sulfate capsule 220 mg (220 mg Oral Given 04/18/22 0923)  acetaminophen (TYLENOL) tablet 650 mg (650 mg Oral Given 04/18/22 1022)  cefTRIAXone (ROCEPHIN) 2 g in sodium chloride 0.9 % 100 mL IVPB (0 g Intravenous Stopped 04/18/22 1024)  insulin aspart  (novoLOG) injection 4 Units (has no administration in time range)  sodium chloride 0.9 % bolus 1,000 mL (0 mLs Intravenous Stopped 04/17/22 2040)  acetaminophen (TYLENOL) tablet 650 mg (650 mg Oral Given 04/17/22 1927)    Mobility walks with person assist     Focused Assessments Pulmonary Assessment Handoff:  Lung sounds:   O2 Device: Room Air      R Recommendations: See Admitting Provider Note  Report given to:   Additional Notes: Patient is COVID positive and has some weakness

## 2022-04-18 NOTE — Hospital Course (Addendum)
Taken from H&P.  David Sharp is a 77 y.o. male with medical history significant of T2DM, IDA, HLD, HTN, BPH, afib who presented to ED with complaints of weakness and fever.  This morning his son could not get him up out of bed. He had slid down on the floor and his son could not get him up. His wife tested positive on Tuesday. He had a fever this morning at his house to 103. He has no cough or congestion. He has no body aches. He states he took the last pfizer booster in 11/2021 with his flu shot.    ER Course:  vitals: tem: 98.2>100.6, bp: 178/79, HR: 101, oxygen: 93% on RA Pertinent labs: covid positive, wbc: 13.8, hgb: 12.2, glucose: 358, lactic acid: 2.2, CK: 1017 CXR: no active disease.  He was started on Paxlovid  1/20: Febrile this morning.  Patient met sepsis criteria on admission with fever, leukocytosis, tachycardia and tachypnea.  UA with leukocytosis and microscopic hematuria, blood cultures and urine cultures pending.  Procalcitonin elevated at 5.36.  D-dimer 1.43, CRP elevated at 7.8 Lactic acidosis resolved. Starting him on ceftriaxone while waiting for urine culture.  1/21: Afebrile this morning, maximum temperature recorded 101.6 over the past 24 hours.  1/4 blood culture bottles with Klebsiella pneumonia-no resistance detected so far.  Urine cultures pending.  Holding Flomax and Lipitor while patient is on Paxlovid. CBG elevated above 300, increasing Semglee to 20 units twice daily and mealtime coverage to 6 unit along with SSI. Pending PT/OT evaluation.

## 2022-04-18 NOTE — Assessment & Plan Note (Signed)
-  PT/OT evaluation

## 2022-04-18 NOTE — Progress Notes (Signed)
OT Cancellation Note  Patient Details Name: IRVING BLOOR MRN: [id number] DOB: 1945-08-09   Cancelled Treatment:    Reason Eval/Treat Not Completed: Patient not medically ready Patient with elevated troponin compared to admission.  OT to continue to follow and check back as schedule will allow.  Rennie Plowman, MS Acute Rehabilitation Department Office# [phone]  Willa Rough 04/18/2022, 3:43 PM

## 2022-04-19 DIAGNOSIS — M6282 Rhabdomyolysis: Secondary | ICD-10-CM

## 2022-04-19 DIAGNOSIS — U071 COVID-19: Secondary | ICD-10-CM | POA: Diagnosis not present

## 2022-04-19 DIAGNOSIS — L8990 Pressure ulcer of unspecified site, unspecified stage: Secondary | ICD-10-CM | POA: Diagnosis present

## 2022-04-19 DIAGNOSIS — I10 Essential (primary) hypertension: Secondary | ICD-10-CM | POA: Diagnosis not present

## 2022-04-19 DIAGNOSIS — E113553 Type 2 diabetes mellitus with stable proliferative diabetic retinopathy, bilateral: Secondary | ICD-10-CM | POA: Diagnosis not present

## 2022-04-19 DIAGNOSIS — R531 Weakness: Secondary | ICD-10-CM | POA: Diagnosis not present

## 2022-04-19 LAB — BLOOD CULTURE ID PANEL (REFLEXED) - BCID2

## 2022-04-19 LAB — GLUCOSE, CAPILLARY
Glucose-Capillary: 313 mg/dL — ABNORMAL HIGH (ref 70–99)
Glucose-Capillary: 217 mg/dL — ABNORMAL HIGH (ref 70–99)
Glucose-Capillary: 198 mg/dL — ABNORMAL HIGH (ref 70–99)
Glucose-Capillary: 219 mg/dL — ABNORMAL HIGH (ref 70–99)

## 2022-04-19 LAB — CBC WITH DIFFERENTIAL/PLATELET
Abs Immature Granulocytes: 0.05 10*3/uL (ref 0.00–0.07)
Basophils Absolute: 0 10*3/uL (ref 0.0–0.1)
Basophils Relative: 0 %
Eosinophils Absolute: 0 10*3/uL (ref 0.0–0.5)
Eosinophils Relative: 0 %
HCT: 35 % — ABNORMAL LOW (ref 39.0–52.0)
Hemoglobin: 11.8 g/dL — ABNORMAL LOW (ref 13.0–17.0)
Immature Granulocytes: 0 %
Lymphocytes Relative: 5 %
Lymphs Abs: 0.6 10*3/uL — ABNORMAL LOW (ref 0.7–4.0)
MCH: 29.7 pg (ref 26.0–34.0)
MCHC: 33.7 g/dL (ref 30.0–36.0)
MCV: 88.2 fL (ref 80.0–100.0)
Monocytes Absolute: 0.8 10*3/uL (ref 0.1–1.0)
Monocytes Relative: 7 %
Neutro Abs: 10.1 10*3/uL — ABNORMAL HIGH (ref 1.7–7.7)
Neutrophils Relative %: 88 %
Platelets: 126 10*3/uL — ABNORMAL LOW (ref 150–400)
RBC: 3.97 MIL/uL — ABNORMAL LOW (ref 4.22–5.81)
RDW: 14.6 % (ref 11.5–15.5)
WBC: 11.5 10*3/uL — ABNORMAL HIGH (ref 4.0–10.5)
nRBC: 0 % (ref 0.0–0.2)

## 2022-04-19 LAB — COMPREHENSIVE METABOLIC PANEL
ALT: 43 U/L (ref 0–44)
AST: 118 U/L — ABNORMAL HIGH (ref 15–41)
Albumin: 3 g/dL — ABNORMAL LOW (ref 3.5–5.0)
Alkaline Phosphatase: 49 U/L (ref 38–126)
Anion gap: 10 (ref 5–15)
BUN: 33 mg/dL — ABNORMAL HIGH (ref 8–23)
CO2: 21 mmol/L — ABNORMAL LOW (ref 22–32)
Calcium: 8.2 mg/dL — ABNORMAL LOW (ref 8.9–10.3)
Chloride: 104 mmol/L (ref 98–111)
Creatinine, Ser: 1.8 mg/dL — ABNORMAL HIGH (ref 0.61–1.24)
GFR, Estimated: 39 mL/min — ABNORMAL LOW (ref >60)
Glucose, Bld: 289 mg/dL — ABNORMAL HIGH (ref 70–99)
Potassium: 3.8 mmol/L (ref 3.5–5.1)
Sodium: 135 mmol/L (ref 135–145)
Total Bilirubin: 0.7 mg/dL (ref 0.3–1.2)
Total Protein: 5.8 g/dL — ABNORMAL LOW (ref 6.5–8.1)

## 2022-04-19 LAB — CK: Total CK: 3345 U/L — ABNORMAL HIGH (ref 49–397)

## 2022-04-19 MED ORDER — LACTATED RINGERS IV SOLN: INTRAVENOUS | Status: DC

## 2022-04-19 MED ORDER — INSULIN GLARGINE-YFGN 100 UNIT/ML ~~LOC~~ SOLN
20.0000 [IU] | Freq: Two times a day (BID) | SUBCUTANEOUS | Status: DC
  Administered 2022-04-19 – 2022-04-23 (×7): 20 [IU] via SUBCUTANEOUS
  Filled 2022-04-19 (×10): qty 0.2

## 2022-04-19 MED ORDER — INSULIN ASPART 100 UNIT/ML IJ SOLN
6.0000 [IU] | Freq: Three times a day (TID) | INTRAMUSCULAR | Status: DC
  Administered 2022-04-19 – 2022-04-20 (×3): 6 [IU] via SUBCUTANEOUS

## 2022-04-19 NOTE — Progress Notes (Signed)
PHARMACY - PHYSICIAN COMMUNICATION CRITICAL VALUE ALERT - BLOOD CULTURE IDENTIFICATION (BCID)  David Sharp is an 77 y.o. male who presented to [facility] on 04/17/2022 with sepsis.  Assessment:  BCID + Klebsiella pneumoniae (no resistance detected) in 1 out of 4 bottles   Name of physician (or Provider) Contacted: Gershon Cull, FNP  Current antibiotics: Ceftriaxone 2gm IV q24h for UTI/sepsis  Changes to prescribed antibiotics recommended:  Patient is on recommended antibiotics - No changes needed  Results for orders placed or performed during the hospital encounter of 04/17/22  Blood Culture ID Panel (Reflexed) (Collected: 04/17/2022  5:56 PM)  Result Value Ref Range   Enterococcus faecalis NOT DETECTED NOT DETECTED   Enterococcus Faecium NOT DETECTED NOT DETECTED   Listeria monocytogenes NOT DETECTED NOT DETECTED   Staphylococcus species NOT DETECTED NOT DETECTED   Staphylococcus aureus (BCID) NOT DETECTED NOT DETECTED   Staphylococcus epidermidis NOT DETECTED NOT DETECTED   Staphylococcus lugdunensis NOT DETECTED NOT DETECTED   Streptococcus species NOT DETECTED NOT DETECTED   Streptococcus agalactiae NOT DETECTED NOT DETECTED   Streptococcus pneumoniae NOT DETECTED NOT DETECTED   Streptococcus pyogenes NOT DETECTED NOT DETECTED   A.calcoaceticus-baumannii NOT DETECTED NOT DETECTED   Bacteroides fragilis NOT DETECTED NOT DETECTED   Enterobacterales DETECTED (A) NOT DETECTED   Enterobacter cloacae complex NOT DETECTED NOT DETECTED   Escherichia coli NOT DETECTED NOT DETECTED   Klebsiella aerogenes NOT DETECTED NOT DETECTED   Klebsiella oxytoca NOT DETECTED NOT DETECTED   Klebsiella pneumoniae DETECTED (A) NOT DETECTED   Proteus species NOT DETECTED NOT DETECTED   Salmonella species NOT DETECTED NOT DETECTED   Serratia marcescens NOT DETECTED NOT DETECTED   Haemophilus influenzae NOT DETECTED NOT DETECTED   Neisseria meningitidis NOT DETECTED NOT DETECTED   Pseudomonas  aeruginosa NOT DETECTED NOT DETECTED   Stenotrophomonas maltophilia NOT DETECTED NOT DETECTED   Candida albicans NOT DETECTED NOT DETECTED   Candida auris NOT DETECTED NOT DETECTED   Candida glabrata NOT DETECTED NOT DETECTED   Candida krusei NOT DETECTED NOT DETECTED   Candida parapsilosis NOT DETECTED NOT DETECTED   Candida tropicalis NOT DETECTED NOT DETECTED   Cryptococcus neoformans/gattii NOT DETECTED NOT DETECTED   CTX-M ESBL NOT DETECTED NOT DETECTED   Carbapenem resistance IMP NOT DETECTED NOT DETECTED   Carbapenem resistance KPC NOT DETECTED NOT DETECTED   Carbapenem resistance NDM NOT DETECTED NOT DETECTED   Carbapenem resist OXA 48 LIKE NOT DETECTED NOT DETECTED   Carbapenem resistance VIM NOT DETECTED NOT DETECTED    Everette Rank, PharmD 04/19/2022  5:09 AM

## 2022-04-19 NOTE — Evaluation (Signed)
Occupational Therapy Evaluation Patient Details Name: David Sharp MRN: [id number] DOB: 05-Apr-1945 Today's Date: 04/19/2022   History of Present Illness Patient is a 77 year old male presenting to [facility] ED on 04/17/22 after slipping out of bed, found to be (+)COVID. Pt also with sepsis and rhabdomyolysis.  PMH: BPH, GERD, indwelling foley, HLD, HTN, DM, afib   Clinical Impression   Patient is a 77 year old male who was admitted for above. Patient was living at home independently without AD with wife. Patient needed RW for standing balance to participate in tasks with one UE support. Patient was noted to have decreased functional activity tolerance, decreased endurance, decreased standing balance, decreased safety awareness, and decreased knowledge of AD/AE impacting participation in ADLs. Patient would continue to benefit from skilled OT services at this time while admitted and after d/c to address noted deficits in order to improve overall safety and independence in ADLs.       Recommendations for follow up therapy are one component of a multi-disciplinary discharge planning process, led by the attending physician.  Recommendations may be updated based on patient status, additional functional criteria and insurance authorization.   Follow Up Recommendations  Home health OT     Assistance Recommended at Discharge Intermittent Supervision/Assistance  Patient can return home with the following Assistance with cooking/housework;Assist for transportation;Help with stairs or ramp for entrance    Functional Status Assessment  Patient has had a recent decline in their functional status and demonstrates the ability to make significant improvements in function in a reasonable and predictable amount of time.  Equipment Recommendations       Recommendations for Other Services       Precautions / Restrictions Precautions Precautions: Fall Restrictions Weight Bearing Restrictions: No       Mobility Bed Mobility     General bed mobility comments: up in recliner and returned to the same.        Balance Overall balance assessment: Needs assistance Sitting-balance support: No upper extremity supported Sitting balance-Leahy Scale: Good     Standing balance support: No upper extremity supported, Single extremity supported, Bilateral upper extremity supported Standing balance-Leahy Scale: Fair Standing balance comment: Ambulating with RW but able to stand for ADLs and use single UE or no UE for support             ADL either performed or assessed with clinical judgement   ADL Overall ADL's : Needs assistance/impaired Eating/Feeding: Modified independent;Sitting Eating/Feeding Details (indicate cue type and reason): in recliner. Grooming: Dance movement psychotherapist;Wash/dry hands;Set up;Sitting   Upper Body Bathing: Set up;Sitting   Lower Body Bathing: Min guard;Sitting/lateral leans;Sit to/from stand Lower Body Bathing Details (indicate cue type and reason): noted to have some flexion in BLE and kyphotic posture in standing impacting balance without RW. patient typically does not use AD for mobility.simulated task Upper Body Dressing : Set up;Sitting   Lower Body Dressing: Min guard;Sit to/from stand;Sitting/lateral leans;Minimal assistance Lower Body Dressing Details (indicate cue type and reason): able to don/doff socks seated in recliner with MI Toilet Transfer: Minimal assistance;+2 for safety/equipment;Ambulation;Rolling walker (2 wheels) Toilet Transfer Details (indicate cue type and reason): with increased time Toileting- Clothing Manipulation and Hygiene: Minimal assistance;Sit to/from stand Toileting - Clothing Manipulation Details (indicate cue type and reason): flexion in BLE and kyphotic posture impacting balance. needed UE support on RW              Pertinent Vitals/Pain Pain Assessment Pain Assessment: 0-10 Pain Score: 1  Pain Location: achy  generalized Pain Descriptors / Indicators: Aching Pain Intervention(s): Limited activity within patient's tolerance, Monitored during session     Hand Dominance Right   Extremity/Trunk Assessment Upper Extremity Assessment Upper Extremity Assessment: Overall WFL for tasks assessed   Lower Extremity Assessment Lower Extremity Assessment: Defer to PT evaluation   Cervical / Trunk Assessment Cervical / Trunk Assessment: Kyphotic   Communication Communication Communication: No difficulties   Cognition Arousal/Alertness: Awake/alert Behavior During Therapy: WFL for tasks assessed/performed Overall Cognitive Status: Within Functional Limits for tasks assessed           General Comments  Pt on RA with O2 sats 95%            Home Living Family/patient expects to be discharged to:: Private residence Living Arrangements: Spouse/significant other Available Help at Discharge: Family;Available 24 hours/day Type of Home: House Home Access: Stairs to enter [employer] of Steps: 3 Entrance Stairs-Rails:  (front is R and garage is L) Home Layout: One level     Bathroom Shower/Tub: Occupational psychologist: Handicapped height     Home Equipment: Grab bars - tub/shower;Rolling Environmental consultant (2 wheels);Cane - single point;BSC/3in1;Shower seat          Prior Functioning/Environment Prior Level of Function : Independent/Modified Independent             Mobility Comments: Could ambulate in community without AD ADLs Comments: reports ind with adls and iadls        OT Problem List: Decreased activity tolerance;Impaired balance (sitting and/or standing);Decreased coordination;Decreased safety awareness;Decreased knowledge of precautions;Decreased knowledge of use of DME or AE      OT Treatment/Interventions: Self-care/ADL training;Energy conservation;DME and/or AE instruction;Patient/family education;Therapeutic activities    OT Goals(Current goals can be  found in the care plan section) Acute Rehab OT Goals Patient Stated Goal: to get home OT Goal Formulation: With patient Time For Goal Achievement: 05/03/22 Potential to Achieve Goals: Fair  OT Frequency: Min 2X/week    Co-evaluation   Reason for Co-Treatment: For patient/therapist safety (nursing reporting patient is x2 for movement)          AM-PAC OT "6 Clicks" Daily Activity     Outcome Measure Help from another person eating meals?: None Help from another person taking care of personal grooming?: None Help from another person toileting, which includes using toliet, bedpan, or urinal?: A Little Help from another person bathing (including washing, rinsing, drying)?: A Little Help from another person to put on and taking off regular upper body clothing?: None Help from another person to put on and taking off regular lower body clothing?: A Little 6 Click Score: 21   End of Session Equipment Utilized During Treatment: Gait belt;Rolling walker (2 wheels) Nurse Communication: Mobility status  Activity Tolerance: Patient tolerated treatment well Patient left: in chair;with call bell/phone within reach  OT Visit Diagnosis: Unsteadiness on feet (R26.81);Other abnormalities of gait and mobility (R26.89)                Time: 0102-7253 OT Time Calculation (min): 25 min Charges:  OT General Charges $OT Visit: 1 Visit OT Evaluation $OT Eval Moderate Complexity: 1 Mod  Glendora Clouatre OTR/L, MS Acute Rehabilitation Department Office# [phone]   Willa Rough 04/19/2022, 3:20 PM

## 2022-04-19 NOTE — Assessment & Plan Note (Signed)
Patient met sepsis criteria as mentioned below.  Blood cultures growing Klebsiella pneumonia-pending susceptibility.  Also has COVID-19 infection. -Continue with ceftriaxone -Supportive care

## 2022-04-19 NOTE — Assessment & Plan Note (Signed)
77 year old male presenting with 1-2 day history of fever and profound weakness found to have Covid-19. Sepsis criteria met with temp, WBC and tachycardia, also had elevated lactic acid.  UA also concerning for UTI.  Procalcitonin elevated at 5.36 --Start him on ceftriaxone -Continue with supportive care -PT/OT to eval for weakness

## 2022-04-19 NOTE — Assessment & Plan Note (Signed)
A1C at 8.5, CBG elevated above 300. Hold metfromin during hospitalization Increase Semglee to 20 units twice daily Increase mealtime coverage to 6 unit SSI and accuchecks qac/hs

## 2022-04-19 NOTE — Progress Notes (Signed)
Progress Note   Patient: David Sharp XTK:[id number] DOB: [birth year] DOA: 04/17/2022     1 DOS: the patient was seen and examined on 04/19/2022   Brief Sharp course: Taken from H&P.  David Sharp is a 77 y.o. male with medical history significant of T2DM, IDA, HLD, HTN, BPH, afib who presented to ED with complaints of weakness and fever.  This morning his son could not get him up out of bed. He had slid down on the floor and his son could not get him up. His wife tested positive on Tuesday. He had a fever this morning at his house to 103. He has no cough or congestion. He has no body aches. He states he took the last pfizer booster in 11/2021 with his flu shot.    ER Course:  vitals: tem: 98.2>100.6, bp: 178/79, HR: 101, oxygen: 93% on RA Pertinent labs: covid positive, wbc: 13.8, hgb: 12.2, glucose: 358, lactic acid: 2.2, CK: 1017 CXR: no active disease.  He was started on Paxlovid  1/20: Febrile this morning.  Patient met sepsis criteria on admission with fever, leukocytosis, tachycardia and tachypnea.  UA with leukocytosis and microscopic hematuria, blood cultures and urine cultures pending.  Procalcitonin elevated at 5.36.  D-dimer 1.43, CRP elevated at 7.8 Lactic acidosis resolved. Starting him on ceftriaxone while waiting for urine culture.  1/21: Afebrile this morning, maximum temperature recorded 101.6 over the past 24 hours.  1/4 blood culture bottles with Klebsiella pneumonia-no resistance detected so far.  Urine cultures pending.  Holding Flomax and Lipitor while patient is on Paxlovid. CBG elevated above 300, increasing Semglee to 20 units twice daily and mealtime coverage to 6 unit along with SSI. Pending PT/OT evaluation.   Assessment and Plan: * Sepsis David Sharp) Patient met sepsis criteria as mentioned below.  Blood cultures growing Klebsiella pneumonia-pending susceptibility.  Also has COVID-19 infection. -Continue with ceftriaxone -Supportive care  Rhabdomyolysis Most  likely secondary to staying on floor.  Also has an history of rigors with fever. CK increased to 4876. -Monitor CK -Continue with IV fluid -Holding Lipitor  weakness and sepsis criteria due to COVID-28 77 year old male presenting with 1-2 day history of fever and profound weakness found to have Covid-19. Sepsis criteria met with temp, WBC and tachycardia, also had elevated lactic acid.  UA also concerning for UTI.  Procalcitonin elevated at 5.36 --Start him on ceftriaxone -Continue with supportive care -PT/OT to eval for weakness   Type 2 diabetes mellitus with stable proliferative retinopathy of both eyes, without long-term current use of insulin (HCC) A1C at 8.5, CBG elevated above 300. Hold metfromin during hospitalization Increase Semglee to 20 units twice daily Increase mealtime coverage to 6 unit SSI and accuchecks qac/hs   Atrial fibrillation (HCC) PAF in 11/21 in setting acute illness requiring hospitalization  NSR and followed by cardiology  Was taken off eliquis due to hematuria and anemia  -zio showed no episodes of afib -continue cardizem, monitor on tele  Essential hypertension Above goal, has not had his medication Continue cardizem '180mg'$  daily and lisinopril '40mg'$  daily    Dyslipidemia -Hold Lipitor while patient is on Paxlovid  Iron deficiency anemia Stable, continue to monitor   BPH with urinary obstruction Elevated bph, followed by urology  -Hold Flomax for few days while patient is on Paxlovid due to drug interaction  Weakness -PT/OT evaluation   Subjective: Patient was seen and examined today.  Continued to feel weak.  He is still not out of bed.  Physical Exam: Vitals:   04/18/22 1543 04/18/22 1826 04/18/22 2200 04/19/22 0235  BP: (!) 161/86 (!) 155/54 139/60 (!) 146/63  Pulse: 61 82 74 66  Resp: (!) 22 (!) '24 18 16  '$ Temp: 98 F (36.7 C) 98.3 F (36.8 C) 99.8 F (37.7 C)   TempSrc: Oral  Oral   SpO2: 100% 90% 91% 94%  Weight:      Height:        General.  Elderly man, in no acute distress. Pulmonary.  Lungs clear bilaterally, normal respiratory effort. CV.  Regular rate and rhythm, no JVD, rub or murmur. Abdomen.  Soft, nontender, nondistended, BS positive. CNS.  Alert and oriented .  No focal neurologic deficit. Extremities.  No edema, no cyanosis, pulses intact and symmetrical. Psychiatry.  Judgment and insight appears normal.    Data Reviewed: Prior data reviewed  Family Communication: Talked with wife on phone  Disposition: Status is: Inpatient   Planned Discharge Destination: Home   Time spent: 50 minutes  This record has been created using Systems analyst. Errors have been sought and corrected,but may not always be located. Such creation errors do not reflect on the standard of care.   Author: Lorella Nimrod, MD 04/19/2022 10:07 AM  For on call review [email or web address].

## 2022-04-19 NOTE — Assessment & Plan Note (Addendum)
Most likely secondary to staying on floor.  Also has an history of rigors with fever. CK increased to 4876. -Monitor CK -Continue with IV fluid -Holding Lipitor

## 2022-04-19 NOTE — Assessment & Plan Note (Signed)
Elevated bph, followed by urology  -Hold Flomax for few days while patient is on Paxlovid due to drug interaction

## 2022-04-19 NOTE — Assessment & Plan Note (Signed)
-  Hold Lipitor while patient is on Paxlovid

## 2022-04-19 NOTE — Evaluation (Signed)
Physical Therapy Evaluation Patient Details Name: David Sharp MRN: [id number] DOB: 28-Jul-1945 Today's Date: 04/19/2022  History of Present Illness  Pt is a 77yo male presenting to [facility] ED on 04/17/22 after slipping out of bed, found to be (+)COVID. Pt also with sepsis and rhabdomyolysis.  PMH: BPH, GERD, indwelling foley, HLD, HTN, DM, afib  Clinical Impression  Pt admitted with above diagnosis. At baseline, pt reports independent without use of AD. He lives with his wife in single level home.  Today, pt requiring min guard to min A for transfers and to ambulate 20' in room with RW.  Pt on RA with VSS.  Pt expected to progress well with therapy and likley able to return home at d/c. Pt currently with functional limitations due to the deficits listed below (see PT Problem List). Pt will benefit from skilled PT to increase their independence and safety with mobility to allow discharge to the venue listed below.          Recommendations for follow up therapy are one component of a multi-disciplinary discharge planning process, led by the attending physician.  Recommendations may be updated based on patient status, additional functional criteria and insurance authorization.  Follow Up Recommendations Home health PT      Assistance Recommended at Discharge Intermittent Supervision/Assistance  Patient can return home with the following  A little help with walking and/or transfers;A little help with bathing/dressing/bathroom;Assistance with cooking/housework;Help with stairs or ramp for entrance    Equipment Recommendations None recommended by PT  Recommendations for Other Services       Functional Status Assessment Patient has had a recent decline in their functional status and demonstrates the ability to make significant improvements in function in a reasonable and predictable amount of time.     Precautions / Restrictions Precautions Precautions: Fall      Mobility  Bed Mobility                General bed mobility comments: in chair    Transfers Overall transfer level: Needs assistance Equipment used: Rolling walker (2 wheels) Transfers: Sit to/from Stand Sit to Stand: Min assist, +2 safety/equipment           General transfer comment: Initial stand from chair with min guard and cues to scoot forward and push up - did require increased effort from pt.  From [facility] pt did need Min A    Ambulation/Gait Ambulation/Gait assistance: Min guard Gait Distance (Feet): 20 Feet Assistive device: Rolling walker (2 wheels) Gait Pattern/deviations: Step-through pattern, Decreased stride length, Wide base of support, Trunk flexed Gait velocity: decreased     General Gait Details: Trunk flexed, genu varus - pt reports these are baseline.  Limited distance due to fatigue and urinary incontinence  Stairs            Wheelchair Mobility    Modified Rankin (Stroke Patients Only)       Balance Overall balance assessment: Needs assistance Sitting-balance support: No upper extremity supported Sitting balance-Leahy Scale: Good     Standing balance support: No upper extremity supported, Single extremity supported, Bilateral upper extremity supported Standing balance-Leahy Scale: Fair Standing balance comment: Ambulating with RW but able to stand for ADLs and use single UE or no UE for support                             Pertinent Vitals/Pain Pain Assessment Pain Assessment: 0-10 Pain Score: 1  Pain Location: achy generalized Pain Descriptors / Indicators: Aching Pain Intervention(s): Limited activity within patient's tolerance, Monitored during session    Home Living Family/patient expects to be discharged to:: Private residence Living Arrangements: Spouse/significant other Available Help at Discharge: Family;Available 24 hours/day Type of Home: House Home Access: Stairs to enter Entrance Stairs-Rails:  (front is R and garage is L) Entrance  Stairs-Number of Steps: 3   Home Layout: One level Home Equipment: Grab bars - tub/shower;Rolling Walker (2 wheels);Cane - single point;BSC/3in1;Shower seat      Prior Function Prior Level of Function : Independent/Modified Independent             Mobility Comments: Could ambulate in community without AD ADLs Comments: reports ind with adls and iadls     Hand Dominance        Extremity/Trunk Assessment   Upper Extremity Assessment Upper Extremity Assessment: Defer to OT evaluation    Lower Extremity Assessment Lower Extremity Assessment: Overall WFL for tasks assessed    Cervical / Trunk Assessment Cervical / Trunk Assessment: Kyphotic  Communication   Communication: No difficulties  Cognition Arousal/Alertness: Awake/alert Behavior During Therapy: WFL for tasks assessed/performed Overall Cognitive Status: Within Functional Limits for tasks assessed                                          General Comments General comments (skin integrity, edema, etc.): Pt on RA with O2 sats 95%    Exercises     Assessment/Plan    PT Assessment Patient needs continued PT services  PT Problem List Decreased strength;Cardiopulmonary status limiting activity;Decreased activity tolerance;Decreased knowledge of use of DME;Decreased balance;Decreased mobility       PT Treatment Interventions DME instruction;Therapeutic exercise;Gait training;Balance training;Stair training;Functional mobility training;Therapeutic activities;Patient/family education    PT Goals (Current goals can be found in the Care Plan section)  Acute Rehab PT Goals Patient Stated Goal: return home PT Goal Formulation: With patient Time For Goal Achievement: 05/03/22 Potential to Achieve Goals: Good    Frequency Min 3X/week     Co-evaluation PT/OT/SLP Co-Evaluation/Treatment: Yes Reason for Co-Treatment: For patient/therapist safety (per chart was max x 2)           AM-PAC PT "6  Clicks" Mobility  Outcome Measure Help needed turning from your back to your side while in a flat bed without using bedrails?: None Help needed moving from lying on your back to sitting on the side of a flat bed without using bedrails?: A Little Help needed moving to and from a bed to a chair (including a wheelchair)?: A Little Help needed standing up from a chair using your arms (e.g., wheelchair or bedside chair)?: A Little Help needed to walk in hospital room?: A Little Help needed climbing 3-5 steps with a railing? : A Little 6 Click Score: 19    End of Session Equipment Utilized During Treatment: Gait belt Activity Tolerance: Patient tolerated treatment well Patient left: in chair;with call bell/phone within reach Nurse Communication: Mobility status PT Visit Diagnosis: Other abnormalities of gait and mobility (R26.89);Muscle weakness (generalized) (M62.81)    Time: 5284-1324 PT Time Calculation (min) (ACUTE ONLY): 29 min   Charges:   PT Evaluation $PT Eval Low Complexity: 1 Low          Lynsi Dooner, PT Acute Rehab [facility] [phone]   Karlton Lemon 04/19/2022, 1:00 PM

## 2022-04-20 DIAGNOSIS — E669 Obesity, unspecified: Secondary | ICD-10-CM | POA: Diagnosis not present

## 2022-04-20 DIAGNOSIS — B961 Klebsiella pneumoniae [K. pneumoniae] as the cause of diseases classified elsewhere: Secondary | ICD-10-CM

## 2022-04-20 DIAGNOSIS — N401 Enlarged prostate with lower urinary tract symptoms: Secondary | ICD-10-CM

## 2022-04-20 DIAGNOSIS — D500 Iron deficiency anemia secondary to blood loss (chronic): Secondary | ICD-10-CM

## 2022-04-20 DIAGNOSIS — N138 Other obstructive and reflux uropathy: Secondary | ICD-10-CM

## 2022-04-20 DIAGNOSIS — E785 Hyperlipidemia, unspecified: Secondary | ICD-10-CM | POA: Diagnosis not present

## 2022-04-20 DIAGNOSIS — I4811 Longstanding persistent atrial fibrillation: Secondary | ICD-10-CM

## 2022-04-20 DIAGNOSIS — R7881 Bacteremia: Secondary | ICD-10-CM | POA: Diagnosis present

## 2022-04-20 DIAGNOSIS — U071 COVID-19: Secondary | ICD-10-CM | POA: Diagnosis not present

## 2022-04-20 DIAGNOSIS — L89152 Pressure ulcer of sacral region, stage 2: Secondary | ICD-10-CM | POA: Diagnosis present

## 2022-04-20 DIAGNOSIS — I10 Essential (primary) hypertension: Secondary | ICD-10-CM | POA: Diagnosis not present

## 2022-04-20 LAB — COMPREHENSIVE METABOLIC PANEL
ALT: 44 U/L (ref 0–44)
AST: 107 U/L — ABNORMAL HIGH (ref 15–41)
Albumin: 2.8 g/dL — ABNORMAL LOW (ref 3.5–5.0)
Alkaline Phosphatase: 47 U/L (ref 38–126)
Anion gap: 9 (ref 5–15)
BUN: 34 mg/dL — ABNORMAL HIGH (ref 8–23)
CO2: 19 mmol/L — ABNORMAL LOW (ref 22–32)
Calcium: 8.3 mg/dL — ABNORMAL LOW (ref 8.9–10.3)
Chloride: 105 mmol/L (ref 98–111)
Creatinine, Ser: 1.48 mg/dL — ABNORMAL HIGH (ref 0.61–1.24)
GFR, Estimated: 49 mL/min — ABNORMAL LOW (ref >60)
Glucose, Bld: 182 mg/dL — ABNORMAL HIGH (ref 70–99)
Potassium: 3.4 mmol/L — ABNORMAL LOW (ref 3.5–5.1)
Sodium: 133 mmol/L — ABNORMAL LOW (ref 135–145)
Total Bilirubin: 0.8 mg/dL (ref 0.3–1.2)
Total Protein: 5.7 g/dL — ABNORMAL LOW (ref 6.5–8.1)

## 2022-04-20 LAB — CBC WITH DIFFERENTIAL/PLATELET
Abs Immature Granulocytes: 0.04 10*3/uL (ref 0.00–0.07)
Basophils Absolute: 0 10*3/uL (ref 0.0–0.1)
Basophils Relative: 0 %
Eosinophils Absolute: 0 10*3/uL (ref 0.0–0.5)
Eosinophils Relative: 0 %
HCT: 37.4 % — ABNORMAL LOW (ref 39.0–52.0)
Hemoglobin: 12.5 g/dL — ABNORMAL LOW (ref 13.0–17.0)
Immature Granulocytes: 0 %
Lymphocytes Relative: 5 %
Lymphs Abs: 0.5 10*3/uL — ABNORMAL LOW (ref 0.7–4.0)
MCH: 29.4 pg (ref 26.0–34.0)
MCHC: 33.4 g/dL (ref 30.0–36.0)
MCV: 88 fL (ref 80.0–100.0)
Monocytes Absolute: 0.9 10*3/uL (ref 0.1–1.0)
Monocytes Relative: 10 %
Neutro Abs: 7.9 10*3/uL — ABNORMAL HIGH (ref 1.7–7.7)
Neutrophils Relative %: 85 %
Platelets: 126 10*3/uL — ABNORMAL LOW (ref 150–400)
RBC: 4.25 MIL/uL (ref 4.22–5.81)
RDW: 14.8 % (ref 11.5–15.5)
WBC: 9.4 10*3/uL (ref 4.0–10.5)
nRBC: 0 % (ref 0.0–0.2)

## 2022-04-20 LAB — URINE CULTURE: Culture: 70000 — AB

## 2022-04-20 LAB — GLUCOSE, CAPILLARY
Glucose-Capillary: 105 mg/dL — ABNORMAL HIGH (ref 70–99)
Glucose-Capillary: 195 mg/dL — ABNORMAL HIGH (ref 70–99)
Glucose-Capillary: 248 mg/dL — ABNORMAL HIGH (ref 70–99)
Glucose-Capillary: 449 mg/dL — ABNORMAL HIGH (ref 70–99)
Glucose-Capillary: 55 mg/dL — ABNORMAL LOW (ref 70–99)
Glucose-Capillary: 91 mg/dL (ref 70–99)

## 2022-04-20 LAB — LACTATE DEHYDROGENASE: LDH: 215 U/L — ABNORMAL HIGH (ref 98–192)

## 2022-04-20 LAB — SEDIMENTATION RATE: Sed Rate: 50 mm/hr — ABNORMAL HIGH (ref 0–16)

## 2022-04-20 LAB — FERRITIN: Ferritin: 166 ng/mL (ref 24–336)

## 2022-04-20 LAB — CK: Total CK: 1971 U/L — ABNORMAL HIGH (ref 49–397)

## 2022-04-20 LAB — C-REACTIVE PROTEIN: CRP: 20.1 mg/dL — ABNORMAL HIGH (ref <1.0)

## 2022-04-20 LAB — PROCALCITONIN: Procalcitonin: 14.88 ng/mL

## 2022-04-20 LAB — D-DIMER, QUANTITATIVE: D-Dimer, Quant: 1.6 ug/mL-FEU — ABNORMAL HIGH (ref 0.00–0.50)

## 2022-04-20 MED ORDER — INSULIN ASPART 100 UNIT/ML IJ SOLN
10.0000 [IU] | Freq: Three times a day (TID) | INTRAMUSCULAR | Status: DC
  Administered 2022-04-20 – 2022-04-23 (×8): 10 [IU] via SUBCUTANEOUS

## 2022-04-20 MED ORDER — INSULIN ASPART 100 UNIT/ML IJ SOLN
0.0000 [IU] | INTRAMUSCULAR | Status: DC
  Administered 2022-04-20: 7 [IU] via SUBCUTANEOUS
  Administered 2022-04-20: 20 [IU] via SUBCUTANEOUS
  Administered 2022-04-21 (×2): 3 [IU] via SUBCUTANEOUS
  Administered 2022-04-21 – 2022-04-22 (×2): 4 [IU] via SUBCUTANEOUS
  Administered 2022-04-22 (×3): 3 [IU] via SUBCUTANEOUS
  Administered 2022-04-23 (×2): 4 [IU] via SUBCUTANEOUS
  Administered 2022-04-23 (×2): 7 [IU] via SUBCUTANEOUS

## 2022-04-20 MED ORDER — POTASSIUM CHLORIDE CRYS ER 20 MEQ PO TBCR
40.0000 meq | EXTENDED_RELEASE_TABLET | Freq: Two times a day (BID) | ORAL | Status: AC
  Administered 2022-04-20 (×2): 40 meq via ORAL
  Filled 2022-04-20 (×2): qty 2

## 2022-04-20 NOTE — Progress Notes (Signed)
Physical Therapy Treatment Patient Details Name: David Sharp MRN: [id number] DOB: 1945-05-20 Today's Date: 04/20/2022   History of Present Illness Patient is a 77 year old male presenting to [facility] ED on 04/17/22 after slipping out of bed, found to be (+)COVID. Pt also with sepsis and rhabdomyolysis.  PMH: BPH, GERD, indwelling foley, HLD, HTN, DM, afib    PT Comments    Pt seen for PT tx with pt agreeable. Pt requires min assist with use of hospital bed features to upright trunk to come to sit EOB. Pt is able to complete STS with CGA & cuing for hand placement, gait in room with CGA<>supervision with RW. Pt requires cuing to ambulate within base of AD, especially with turning, but demonstrates increasing gait distances on this date. Pt engages in STS activity for strengthening & endurance training. Continue to recommend HHPT f/u upon d/c.    Recommendations for follow up therapy are one component of a multi-disciplinary discharge planning process, led by the attending physician.  Recommendations may be updated based on patient status, additional functional criteria and insurance authorization.  Follow Up Recommendations  Home health PT     Assistance Recommended at Discharge Intermittent Supervision/Assistance  Patient can return home with the following A little help with walking and/or transfers;A little help with bathing/dressing/bathroom;Assistance with cooking/housework;Help with stairs or ramp for entrance   Equipment Recommendations  None recommended by PT    Recommendations for Other Services       Precautions / Restrictions Precautions Precautions: Fall Restrictions Weight Bearing Restrictions: No     Mobility  Bed Mobility Overal bed mobility: Needs Assistance Bed Mobility: Supine to Sit     Supine to sit: Min assist, HOB elevated     General bed mobility comments: Multiple attempts with use of hospital bed features (HOB elevated, bed rails), ultimately requiring min  assist to fully upright trunk & sit EOB.    Transfers Overall transfer level: Needs assistance Equipment used: Rolling walker (2 wheels) Transfers: Sit to/from Stand Sit to Stand: Min guard           General transfer comment: STS from elevated EOB with cuing for safe hand placement with CGA & RW    Ambulation/Gait Ambulation/Gait assistance: Min guard, Supervision Gait Distance (Feet): 85 Feet Assistive device: Rolling walker (2 wheels) Gait Pattern/deviations: Decreased step length - right, Decreased step length - left, Decreased stride length, Decreased dorsiflexion - right, Decreased dorsiflexion - left, Trunk flexed Gait velocity: decreased     General Gait Details: Pt ambulates multiple laps in room equating ~85 ft with RW. Pt requires cuing to ambulate within base of AD especially with turning, for upright posture (pt reports trunk flexion/kyphosis is baseline).   Stairs             Wheelchair Mobility    Modified Rankin (Stroke Patients Only)       Balance Overall balance assessment: Needs assistance Sitting-balance support: No upper extremity supported Sitting balance-Leahy Scale: Good     Standing balance support: Bilateral upper extremity supported, During functional activity, Reliant on assistive device for balance Standing balance-Leahy Scale: Fair                              Cognition Arousal/Alertness: Awake/alert Behavior During Therapy: WFL for tasks assessed/performed, Flat affect Overall Cognitive Status: Within Functional Limits for tasks assessed  Exercises Other Exercises Other Exercises: Pt performs 5x STS from recliner with use of BUE support & supervision with task focusing on general strengthening & endurance training; pt requires extra time to complete.    General Comments General comments (skin integrity, edema, etc.): Max HR 154 bpm during session, pt  denies SOB or chest pain - nurse aware      Pertinent Vitals/Pain Pain Assessment Pain Assessment: Faces Faces Pain Scale: Hurts little more Pain Location: back Pain Descriptors / Indicators: Discomfort Pain Intervention(s): Monitored during session, Repositioned    Home Living                          Prior Function            PT Goals (current goals can now be found in the care plan section) Acute Rehab PT Goals Patient Stated Goal: return home PT Goal Formulation: With patient Time For Goal Achievement: 05/03/22 Potential to Achieve Goals: Good Progress towards PT goals: Progressing toward goals    Frequency    Min 3X/week      PT Plan Current plan remains appropriate    Co-evaluation              AM-PAC PT "6 Clicks" Mobility   Outcome Measure  Help needed turning from your back to your side while in a flat bed without using bedrails?: None Help needed moving from lying on your back to sitting on the side of a flat bed without using bedrails?: A Lot Help needed moving to and from a bed to a chair (including a wheelchair)?: A Little Help needed standing up from a chair using your arms (e.g., wheelchair or bedside chair)?: A Little Help needed to walk in hospital room?: A Little Help needed climbing 3-5 steps with a railing? : A Little 6 Click Score: 18    End of Session   Activity Tolerance: Patient tolerated treatment well Patient left: in chair;with nursing/sitter in room;with call bell/phone within reach Nurse Communication: Mobility status PT Visit Diagnosis: Muscle weakness (generalized) (M62.81);Unsteadiness on feet (R26.81)     Time: 9643-8381 PT Time Calculation (min) (ACUTE ONLY): 23 min  Charges:  $Therapeutic Activity: 23-37 mins                     [name], PT, DPT 04/20/22, 11:09 AM   [name] 04/20/2022, 11:08 AM

## 2022-04-20 NOTE — TOC Initial Note (Addendum)
Transition of Care [facility]) - Initial/Assessment Note    Patient Details  Name: David Sharp MRN: [id number] Date of Birth: 02-04-1946  Transition of Care [facility]) CM/SW Contact:    Vassie Moselle, LCSW Phone Number: 04/20/2022, 12:11 PM  Clinical Narrative:                 Spoke with pt's wife via t/c who shares that pt has not had home health services in the past but, is agreeable to having [place] arranged. Pt has canes and RW at home and denies further DME needs. HHPT/OT/RN has been arranged with [facility]. HH orders will need to be placed prior to discharge.   Expected Discharge Plan: [place] Barriers to Discharge: No Barriers Identified   Patient Goals and CMS Choice Patient states their goals for this hospitalization and ongoing recovery are:: To return home CMS Medicare.gov Compare Post Acute Care list provided to:: Patient Choice offered to / list presented to : Patient, [place] ownership interest in [facility].provided to::  (NA)    Expected Discharge Plan and Services In-house Referral: Clinical Social Work Discharge Planning Services: CM Consult Post Acute Care Choice: [place] arrangements for the past 2 months: Single Family Home                 DME Arranged: N/A DME Agency: NA       HH Arranged: PT, OT HH Agency: [place] Date [place]: 04/20/22 Time HH Agency Contacted: 1210 Representative spoke with at [place]: [place] Arrangements/Services Living arrangements for the past 2 months: [place] with:: Spouse Patient language and need for interpreter reviewed:: Yes Do you feel safe going back to the place where you live?: Yes      Need for Family Participation in Patient Care: No (Comment) Care giver support system in place?: No (comment) Current home services: DME (Canes, RW's) Criminal Activity/Legal Involvement Pertinent to Current Situation/Hospitalization: No -  Comment as needed  Activities of Daily Living Home Assistive Devices/Equipment: None ADL Screening (condition at time of admission) Patient's cognitive ability adequate to safely complete daily activities?: Yes Is the patient deaf or have difficulty hearing?: No Does the patient have difficulty seeing, even when wearing glasses/contacts?: Yes Does the patient have difficulty concentrating, remembering, or making decisions?: No Patient able to express need for assistance with ADLs?: No Does the patient have difficulty dressing or bathing?: Yes Independently performs ADLs?: Yes (appropriate for developmental age) Does the patient have difficulty walking or climbing stairs?: No Weakness of Legs: Both Weakness of Arms/Hands: Both  Permission Sought/Granted Permission sought to share information with : Family Supports, Chartered certified accountant granted to share information with : Yes, Verbal Permission Granted  Share Information with NAME: David Sharp  Permission granted to share info w AGENCY: [facility]  Permission granted to share info w Relationship: Spouse  Permission granted to share info w Contact Information: [phone]  Emotional Assessment   Attitude/Demeanor/Rapport: Unable to Assess Affect (typically observed): Unable to Assess Orientation: : Oriented to Self, Oriented to Place, Oriented to  Time, Oriented to Situation Alcohol / Substance Use: Not Applicable Psych Involvement: No (comment)  Admission diagnosis:  COVID-19 [U07.1] Patient Active Problem List   Diagnosis Date Noted   Sacral decubitus ulcer, stage II ([place]) 04/20/2022   Bacteremia due to Klebsiella pneumoniae 04/20/2022   Obesity (BMI 30-39.9) 04/20/2022   Pressure injury of skin 04/19/2022  Rhabdomyolysis 04/19/2022   Sepsis ([place]) 04/18/2022   weakness and sepsis criteria due to COVID-19 04/17/2022   Weakness 04/17/2022   Atrial fibrillation ([place]) 04/03/2020   Dyslipidemia 04/03/2020    Iron deficiency anemia 04/03/2020   ARF (acute renal failure) ([place]) 02/01/2020   Hyponatremia 02/01/2020   Hyperkalemia 02/01/2020   Essential hypertension 02/01/2020   COVID-19 virus infection 02/01/2020   Hyperosmolar hyperglycemic state (HHS) ([place]) 02/01/2020   Hyperglycemia 01/31/2020   BPH with urinary obstruction 01/27/2016   Type 2 diabetes mellitus with stable proliferative retinopathy of both eyes, without long-term current use of insulin ([place]) 01/06/2011   PCP:  [name], MD Pharmacy:   [id number] [place], [place] - [id number] [id number]: [phone]: [phone]    Social Determinants of Health ([place] Social History: SDOH Screenings   Food Insecurity: No Food Insecurity (04/18/2022)  Housing: Low Risk  (04/18/2022)  Transportation Needs: No Transportation Needs (04/18/2022)  Utilities: Not At Risk (04/18/2022)  Tobacco Use: Low Risk  (04/17/2022)   SDOH Interventions:     Readmission Risk Interventions    04/20/2022   12:08 PM  Readmission Risk Prevention Plan  Transportation Screening Complete  PCP or Specialist Appt within 5-7 Days Complete  Home Care Screening Complete  Medication Review (RN CM) Complete

## 2022-04-20 NOTE — Progress Notes (Signed)
David Sharp IWP:[id number] DOB: 25-Sep-1945 DOA: 04/17/2022 PCP: [name], MD   Subj: David Sharp is a 77 y.o. male with medical history significant of T2DM, IDA, HLD, HTN, BPH, afib who presented to ED with complaints of weakness and fever.  This morning his son could not get him up out of bed. He had slid down on the floor and his son could not get him up. His wife tested positive on Tuesday. He had a fever this morning at his house to 103. He has no cough or congestion. He has no body aches. He states he took the last pfizer booster in 11/2021 with his flu shot.     ER Course:  vitals: tem: 98.2>100.6, bp: 178/79, HR: 101, oxygen: 93% on RA Pertinent labs: covid positive, wbc: 13.8, hgb: 12.2, glucose: 358, lactic acid: 2.2, CK: 1017 CXR: no active disease.  He was started on Paxlovid   1/20: Febrile this morning.  Patient met sepsis criteria on admission with fever, leukocytosis, tachycardia and tachypnea.  UA with leukocytosis and microscopic hematuria, blood cultures and urine cultures pending.  Procalcitonin elevated at 5.36.  D-dimer 1.43, CRP elevated at 7.8 Lactic acidosis resolved. Starting him on ceftriaxone while waiting for urine culture.   1/21: Afebrile this morning, maximum temperature recorded 101.6 over the past 24 hours.  1/4 blood culture bottles with Klebsiella pneumonia-no resistance detected so far.  Urine cultures pending.  Holding Flomax and Lipitor while patient is on Paxlovid. CBG elevated above 300, increasing Semglee to 20 units twice daily and mealtime coverage to 6 unit along with SSI. Pending PT/OT evaluation.   Obj: A/O x 4 no complaints   Objective: VITAL SIGNS: Temp: 98.2 F (36.8 C) (01/22 0525) Temp Source: Oral (01/22 0525) BP: 150/95 (01/22 0525) Pulse Rate: 73 (01/22 0525)   VENTILATOR SETTINGS: **   Intake/Output Summary (Last 24 hours) at 04/20/2022 0759 Last data filed at 04/20/2022 5053 Gross per 24 hour  Intake 2891.2 ml  Output  1250 ml  Net 1641.2 ml     Exam: Physical Exam:  General: A/O x 4 No acute respiratory distress Eyes: negative scleral hemorrhage, negative anisocoria, negative icterus ENT: Negative Runny nose, negative gingival bleeding, Neck:  Negative scars, masses, torticollis, lymphadenopathy, JVD Lungs: Clear to auscultation bilaterally without wheezes or crackles Cardiovascular: Regular rate and rhythm without murmur gallop or rub normal S1 and S2 Abdomen: negative abdominal pain, nondistended, positive soft, bowel sounds, no rebound, no ascites, no appreciable mass Extremities: No significant cyanosis, clubbing, or edema bilateral lower extremities Skin: Negative rashes, lesions, ulcers Psychiatric:  Negative depression, negative anxiety, negative fatigue, negative mania  Central nervous system:  Cranial nerves II through XII intact, tongue/uvula midline, all extremities muscle strength 5/5, sensation intact throughout,negative dysarthria, negative expressive aphasia, negative receptive aphasia.   .     Mobility Assessment (last 72 hours)     Mobility Assessment     Row Name 04/19/22 2159 04/19/22 1518 04/19/22 1258 04/19/22 0900 04/18/22 1400   Does patient have an order for bedrest or is patient medically unstable No - Continue assessment No - Continue assessment -- No - Continue assessment No - Continue assessment   What is the highest level of mobility based on the progressive mobility assessment? Level 5 (Walks with assist in room/hall) - Balance while stepping forward/back and can walk in room with assist - Complete Level 5 (Walks with assist in room/hall) - Balance while stepping forward/back and can walk in room with assist -  Complete Level 5 (Walks with assist in room/hall) - Balance while stepping forward/back and can walk in room with assist - Complete Level 4 (Walks with assist in room) - Balance while marching in place and cannot step forward and back - Complete Level 3 (Stands  with assist) - Balance while standing  and cannot march in place   Is the above level different from baseline mobility prior to current illness? Yes - Recommend PT order Yes - Recommend PT order -- Yes - Recommend PT order --    [place] Name 04/18/22 09:52:35           Does patient have an order for bedrest or is patient medically unstable No - Continue assessment       What is the highest level of mobility based on the progressive mobility assessment? Level 1 (Bedfast) - Unable to balance while sitting on edge of bed                  DVT prophylaxis: Lovenox Code Status: Full Family Communication:  Status is: Inpatient    Dispo: The patient is from: Home              Anticipated d/c is to: Home              Anticipated d/c date is: 3 days              Patient currently is not medically stable to d/c.    Procedures/Significant Events:    Consultants:     Cultures   Antimicrobials: Anti-infectives (From admission, onward)    Start     Ordered Stop   04/18/22 1000  cefTRIAXone (ROCEPHIN) 2 g in sodium chloride 0.9 % 100 mL IVPB        04/18/22 0900     04/17/22 2330  nirmatrelvir/ritonavir (PAXLOVID) 3 tablet        04/17/22 2327 04/22/22 2159         Assessment & Plan: Covid vaccination;   Principal Problem:   Sepsis ([place]) Active Problems:   Rhabdomyolysis   weakness and sepsis criteria due to COVID-19   Type 2 diabetes mellitus with stable proliferative retinopathy of both eyes, without long-term current use of insulin (HCC)   Atrial fibrillation (HCC)   Essential hypertension   Dyslipidemia   Iron deficiency anemia   BPH with urinary obstruction   Weakness   Pressure injury of skin   Sacral decubitus ulcer, stage II ([place])   Bacteremia due to Klebsiella pneumoniae   Obesity (BMI 30-39.9)   Sepsis ([place]) -Sepsis criteria met with temp, WBC and tachycardia, also had elevated lactic acid.    -Multifactorial blood cultures growing Klebsiella  pneumonia-pending susceptibility, COVID-19 infection. -Continue with ceftriaxone - Procalcitonin elevated at 5.36, trend -Supportive care  Klebsiella Bacteremia - Continue minimum 7-day course antibiotics - 1/22 consult ID in a.m.   Rhabdomyolysis Most likely secondary to staying on floor.  Also has an history of rigors with fever.  Latest Reference Range & Units 04/17/22 19:03 04/18/22 13:55 04/19/22 08:37 04/20/22 06:27  CK Total 49 - 397 U/L 1,017 (H) 4,876 (H) 3,345 (H) 1,971 (H)  (H): Data is abnormally high -Holding Lipitor --Increase LR to 245m/hr   Weakness -Multifactorial sepsis, rhabdomyolysis, bacteremia, COVID-19 infection -Treat underlying cause -PT/OT to eval for weakness    DM type II uncontrolled with Hyperglycemia/DM retinopathy -1/19 A1C =at 8.5,  CBG (last 3)  Recent Labs    04/20/22 1141 04/20/22 1645 04/20/22 2006  GLUCAP 449* 248* 105*  -Hold metfromin during hospitalization -Increase Semglee to 20 units BID -NovoLog 10 units qac     Atrial fibrillation (HCC) --PAF in 11/21 in setting acute illness requiring hospitalization  -- Currently NSR and followed by cardiology  -Cardizem 180 mg daily -Lisinopril 40 mg daily (hold) -Eliquis (Hold) 2dary Hematuria and Anemia     Essential hypertension -see A-fib     Dyslipidemia -Hold Lipitor while patient is on Paxlovid   Iron deficiency anemia Stable, continue to monitor    BPH with urinary obstruction Elevated bph, followed by urology  -Hold Flomax for few days while patient is on Paxlovid due to drug interaction   Weakness -PT/OT evaluation  Obesity (BMI32.37 kg/m.) - Address with PCP upon discharge  Hypokalemia - Potassium goal> 4 - 1/22 K-Dur 40 mEq x2   Stage II sacral decubitus ulcer Pressure Injury 04/18/22 Sacrum Left;Right Stage 2 -  Partial thickness loss of dermis presenting as a shallow open injury with a red, pink wound bed without slough. (Active)  04/18/22 1358   Location: Sacrum  Location Orientation: Left;Right  Staging: Stage 2 -  Partial thickness loss of dermis presenting as a shallow open injury with a red, pink wound bed without slough.  Wound Description (Comments):   Present on Admission: Yes  Dressing Type Foam - Lift dressing to assess site every shift 04/19/22 2159        Mobility Assessment (last 72 hours)     Mobility Assessment     Row Name 04/19/22 2159 04/19/22 1518 04/19/22 1258 04/19/22 0900 04/18/22 1400   Does patient have an order for bedrest or is patient medically unstable No - Continue assessment No - Continue assessment -- No - Continue assessment No - Continue assessment   What is the highest level of mobility based on the progressive mobility assessment? Level 5 (Walks with assist in room/hall) - Balance while stepping forward/back and can walk in room with assist - Complete Level 5 (Walks with assist in room/hall) - Balance while stepping forward/back and can walk in room with assist - Complete Level 5 (Walks with assist in room/hall) - Balance while stepping forward/back and can walk in room with assist - Complete Level 4 (Walks with assist in room) - Balance while marching in place and cannot step forward and back - Complete Level 3 (Stands with assist) - Balance while standing  and cannot march in place   Is the above level different from baseline mobility prior to current illness? Yes - Recommend PT order Yes - Recommend PT order -- Yes - Recommend PT order --    [place] Name 04/18/22 09:52:35           Does patient have an order for bedrest or is patient medically unstable No - Continue assessment       What is the highest level of mobility based on the progressive mobility assessment? Level 1 (Bedfast) - Unable to balance while sitting on edge of bed                     Time: 50 minutes         Care during the described time interval was provided by me .  I have reviewed this patient's available  data, including medical history, events of note, physical examination, and all test results as part of my evaluation.

## 2022-04-21 DIAGNOSIS — G4700 Insomnia, unspecified: Secondary | ICD-10-CM

## 2022-04-21 DIAGNOSIS — E785 Hyperlipidemia, unspecified: Secondary | ICD-10-CM | POA: Diagnosis not present

## 2022-04-21 DIAGNOSIS — I10 Essential (primary) hypertension: Secondary | ICD-10-CM | POA: Diagnosis not present

## 2022-04-21 DIAGNOSIS — U071 COVID-19: Secondary | ICD-10-CM | POA: Diagnosis not present

## 2022-04-21 DIAGNOSIS — E669 Obesity, unspecified: Secondary | ICD-10-CM | POA: Diagnosis not present

## 2022-04-21 LAB — COMPREHENSIVE METABOLIC PANEL
ALT: 52 U/L — ABNORMAL HIGH (ref 0–44)
AST: 95 U/L — ABNORMAL HIGH (ref 15–41)
Albumin: 2.7 g/dL — ABNORMAL LOW (ref 3.5–5.0)
Alkaline Phosphatase: 46 U/L (ref 38–126)
Anion gap: 8 (ref 5–15)
BUN: 29 mg/dL — ABNORMAL HIGH (ref 8–23)
CO2: 22 mmol/L (ref 22–32)
Calcium: 8.2 mg/dL — ABNORMAL LOW (ref 8.9–10.3)
Chloride: 104 mmol/L (ref 98–111)
Creatinine, Ser: 1.37 mg/dL — ABNORMAL HIGH (ref 0.61–1.24)
GFR, Estimated: 54 mL/min — ABNORMAL LOW (ref >60)
Glucose, Bld: 97 mg/dL (ref 70–99)
Potassium: 4 mmol/L (ref 3.5–5.1)
Sodium: 134 mmol/L — ABNORMAL LOW (ref 135–145)
Total Bilirubin: 0.8 mg/dL (ref 0.3–1.2)
Total Protein: 5.7 g/dL — ABNORMAL LOW (ref 6.5–8.1)

## 2022-04-21 LAB — CBC WITH DIFFERENTIAL/PLATELET
Abs Immature Granulocytes: 0.03 10*3/uL (ref 0.00–0.07)
Basophils Absolute: 0 10*3/uL (ref 0.0–0.1)
Basophils Relative: 0 %
Eosinophils Absolute: 0 10*3/uL (ref 0.0–0.5)
Eosinophils Relative: 0 %
HCT: 34.9 % — ABNORMAL LOW (ref 39.0–52.0)
Hemoglobin: 11.7 g/dL — ABNORMAL LOW (ref 13.0–17.0)
Immature Granulocytes: 0 %
Lymphocytes Relative: 6 %
Lymphs Abs: 0.5 10*3/uL — ABNORMAL LOW (ref 0.7–4.0)
MCH: 29.5 pg (ref 26.0–34.0)
MCHC: 33.5 g/dL (ref 30.0–36.0)
MCV: 87.9 fL (ref 80.0–100.0)
Monocytes Absolute: 0.9 10*3/uL (ref 0.1–1.0)
Monocytes Relative: 10 %
Neutro Abs: 7.7 10*3/uL (ref 1.7–7.7)
Neutrophils Relative %: 84 %
Platelets: 145 10*3/uL — ABNORMAL LOW (ref 150–400)
RBC: 3.97 MIL/uL — ABNORMAL LOW (ref 4.22–5.81)
RDW: 14.7 % (ref 11.5–15.5)
WBC: 9.2 10*3/uL (ref 4.0–10.5)
nRBC: 0 % (ref 0.0–0.2)

## 2022-04-21 LAB — CULTURE, BLOOD (ROUTINE X 2)

## 2022-04-21 LAB — GLUCOSE, CAPILLARY
Glucose-Capillary: 139 mg/dL — ABNORMAL HIGH (ref 70–99)
Glucose-Capillary: 139 mg/dL — ABNORMAL HIGH (ref 70–99)
Glucose-Capillary: 188 mg/dL — ABNORMAL HIGH (ref 70–99)
Glucose-Capillary: 95 mg/dL (ref 70–99)
Glucose-Capillary: 99 mg/dL (ref 70–99)
Glucose-Capillary: 93 mg/dL (ref 70–99)

## 2022-04-21 LAB — PHOSPHORUS: Phosphorus: 1.7 mg/dL — ABNORMAL LOW (ref 2.5–4.6)

## 2022-04-21 LAB — FERRITIN: Ferritin: 211 ng/mL (ref 24–336)

## 2022-04-21 LAB — MAGNESIUM: Magnesium: 1.8 mg/dL (ref 1.7–2.4)

## 2022-04-21 LAB — LACTATE DEHYDROGENASE: LDH: 179 U/L (ref 98–192)

## 2022-04-21 LAB — SEDIMENTATION RATE: Sed Rate: 58 mm/hr — ABNORMAL HIGH (ref 0–16)

## 2022-04-21 LAB — C-REACTIVE PROTEIN: CRP: 16.8 mg/dL — ABNORMAL HIGH (ref <1.0)

## 2022-04-21 LAB — D-DIMER, QUANTITATIVE: D-Dimer, Quant: 1.78 ug/mL-FEU — ABNORMAL HIGH (ref 0.00–0.50)

## 2022-04-21 LAB — CK: Total CK: 673 U/L — ABNORMAL HIGH (ref 49–397)

## 2022-04-21 LAB — PROCALCITONIN: Procalcitonin: 11.15 ng/mL

## 2022-04-21 MED ORDER — SODIUM PHOSPHATES 45 MMOLE/15ML IV SOLN
15.0000 mmol | Freq: Once | INTRAVENOUS | Status: AC
  Administered 2022-04-21: 15 mmol via INTRAVENOUS
  Filled 2022-04-21: qty 5

## 2022-04-21 MED ORDER — TRAZODONE HCL 50 MG PO TABS: 25.0000 mg | ORAL_TABLET | Freq: Every day | ORAL | Status: DC

## 2022-04-21 MED ORDER — MELATONIN 5 MG PO TABS
5.0000 mg | ORAL_TABLET | Freq: Once | ORAL | Status: DC
  Filled 2022-04-21: qty 1

## 2022-04-21 MED ORDER — MELATONIN 5 MG PO TABS
10.0000 mg | ORAL_TABLET | Freq: Every day | ORAL | Status: DC
  Administered 2022-04-21 – 2022-04-26 (×6): 10 mg via ORAL
  Filled 2022-04-21 (×6): qty 2

## 2022-04-21 MED ORDER — LACTULOSE 10 GM/15ML PO SOLN
30.0000 g | Freq: Three times a day (TID) | ORAL | Status: DC
  Administered 2022-04-21 – 2022-04-22 (×2): 30 g via ORAL
  Filled 2022-04-21 (×3): qty 45

## 2022-04-21 NOTE — Progress Notes (Signed)
Mobility Specialist - Progress Note   04/21/22 1507  Mobility  Activity Ambulated with assistance in room  Level of Assistance Standby assist, set-up cues, supervision of patient - no hands on  Assistive Device Front wheel walker  Distance Ambulated (ft) 60 ft  Activity Response Tolerated well  Mobility Referral Yes  $Mobility charge 1 Mobility   Pt received in recliner and agreeable to mobility. Pt walked around bed 3x. No complaints during session. Pt to bed after session with all needs met.    [facility]

## 2022-04-21 NOTE — Progress Notes (Addendum)
OZIAH VITANZA IHK:[id number] DOB: 03-Aug-1945 DOA: 04/17/2022 PCP: [name], MD   Subj: David Sharp is a 77 y.o. male with medical history significant of T2DM, IDA, HLD, HTN, BPH, afib who presented to ED with complaints of weakness and fever.  This morning his son could not get him up out of bed. He had slid down on the floor and his son could not get him up. His wife tested positive on Tuesday. He had a fever this morning at his house to 103. He has no cough or congestion. He has no body aches. He states he took the last pfizer booster in 11/2021 with his flu shot.     ER Course:  vitals: tem: 98.2>100.6, bp: 178/79, HR: 101, oxygen: 93% on RA Pertinent labs: covid positive, wbc: 13.8, hgb: 12.2, glucose: 358, lactic acid: 2.2, CK: 1017 CXR: no active disease.  He was started on Paxlovid   1/20: Febrile this morning.  Patient met sepsis criteria on admission with fever, leukocytosis, tachycardia and tachypnea.  UA with leukocytosis and microscopic hematuria, blood cultures and urine cultures pending.  Procalcitonin elevated at 5.36.  D-dimer 1.43, CRP elevated at 7.8 Lactic acidosis resolved. Starting him on ceftriaxone while waiting for urine culture.   1/21: Afebrile this morning, maximum temperature recorded 101.6 over the past 24 hours.  1/4 blood culture bottles with Klebsiella pneumonia-no resistance detected so far.  Urine cultures pending.  Holding Flomax and Lipitor while patient is on Paxlovid. CBG elevated above 300, increasing Semglee to 20 units twice daily and mealtime coverage to 6 unit along with SSI. Pending PT/OT evaluation.   Obj: 1/23 afebrile overnight A/O x 4, no complaints   Objective: VITAL SIGNS: Temp: 98.8 F (37.1 C) (01/23 0233) Temp Source: Oral (01/23 0233) BP: 149/79 (01/23 0233) Pulse Rate: 104 (01/23 0233)   VENTILATOR SETTINGS: **   Intake/Output Summary (Last 24 hours) at 04/21/2022 1019 Last data filed at 04/20/2022 2100 Gross per 24 hour   Intake 240 ml  Output 500 ml  Net -260 ml     Physical Exam:  General: A/O x 4, No acute respiratory distress Eyes: negative scleral hemorrhage, negative anisocoria, negative icterus ENT: Negative Runny nose, negative gingival bleeding, Neck:  Negative scars, masses, torticollis, lymphadenopathy, JVD Lungs: Clear to auscultation bilaterally without wheezes or crackles Cardiovascular: Regular rate and rhythm without murmur gallop or rub normal S1 and S2 Abdomen: negative abdominal pain, nondistended, positive soft, bowel sounds, no rebound, no ascites, no appreciable mass Extremities: No significant cyanosis, clubbing, or edema bilateral lower extremities Skin: Negative rashes, lesions, ulcers Psychiatric:  Negative depression, negative anxiety, negative fatigue, negative mania  Central nervous system:  Cranial nerves II through XII intact, tongue/uvula midline, all extremities muscle strength 5/5, sensation intact throughout, negative dysarthria, negative expressive aphasia, negative receptive aphasia.   .     Mobility Assessment (last 72 hours)     Mobility Assessment     Row Name 04/20/22 2115 04/20/22 1601 04/20/22 1107 04/19/22 2159 04/19/22 1518   Does patient have an order for bedrest or is patient medically unstable No - Continue assessment No - Continue assessment -- No - Continue assessment No - Continue assessment   What is the highest level of mobility based on the progressive mobility assessment? Level 5 (Walks with assist in room/hall) - Balance while stepping forward/back and can walk in room with assist - Complete Level 5 (Walks with assist in room/hall) - Balance while stepping forward/back and can walk in  room with assist - Complete Level 5 (Walks with assist in room/hall) - Balance while stepping forward/back and can walk in room with assist - Complete Level 5 (Walks with assist in room/hall) - Balance while stepping forward/back and can walk in room with assist -  Complete Level 5 (Walks with assist in room/hall) - Balance while stepping forward/back and can walk in room with assist - Complete   Is the above level different from baseline mobility prior to current illness? Yes - Recommend PT order Yes - Recommend PT order -- Yes - Recommend PT order Yes - Recommend PT order    [place] Name 04/19/22 1258 04/19/22 0900 04/18/22 1400       Does patient have an order for bedrest or is patient medically unstable -- No - Continue assessment No - Continue assessment     What is the highest level of mobility based on the progressive mobility assessment? Level 5 (Walks with assist in room/hall) - Balance while stepping forward/back and can walk in room with assist - Complete Level 4 (Walks with assist in room) - Balance while marching in place and cannot step forward and back - Complete Level 3 (Stands with assist) - Balance while standing  and cannot march in place     Is the above level different from baseline mobility prior to current illness? -- Yes - Recommend PT order --                DVT prophylaxis: Lovenox Code Status: Full Family Communication:  Status is: Inpatient    Dispo: The patient is from: Home              Anticipated d/c is to: Home              Anticipated d/c date is: 3 days              Patient currently is not medically stable to d/c.    Procedures/Significant Events:    Consultants:     Cultures   Antimicrobials: Anti-infectives (From admission, onward)    Start     Ordered Stop   04/18/22 1000  cefTRIAXone (ROCEPHIN) 2 g in sodium chloride 0.9 % 100 mL IVPB        04/18/22 0900     04/17/22 2330  nirmatrelvir/ritonavir (PAXLOVID) 3 tablet        04/17/22 2327 04/22/22 2159         Assessment & Plan: Covid vaccination;   Principal Problem:   Sepsis ([place]) Active Problems:   Rhabdomyolysis   weakness and sepsis criteria due to COVID-19   Type 2 diabetes mellitus with stable proliferative retinopathy of  both eyes, without long-term current use of insulin (HCC)   Atrial fibrillation (HCC)   Essential hypertension   Dyslipidemia   Iron deficiency anemia   BPH with urinary obstruction   Weakness   Pressure injury of skin   Sacral decubitus ulcer, stage II ([place])   Bacteremia due to Klebsiella pneumoniae   Obesity (BMI 30-39.9)   Sepsis ([place]) -Sepsis criteria met with temp, WBC and tachycardia, also had elevated lactic acid.    -Multifactorial blood cultures growing Klebsiella pneumonia-pending susceptibility, COVID-19 infection. -Continue with ceftriaxone - Procalcitonin elevated at 5.36, trend -Supportive care  Klebsiella Bacteremia - Continue minimum 7-day course antibiotics - 1/22 consult ID in a.m.   Rhabdomyolysis Most likely secondary to staying on floor.  Also has an history of rigors with fever.  Latest Reference Range & Units  04/17/22 19:03 04/18/22 13:55 04/19/22 08:37 04/20/22 06:27  CK Total 49 - 397 U/L 1,017 (H) 4,876 (H) 3,345 (H) 1,971 (H)  (H): Data is abnormally high -Holding Lipitor --Increase LR to 282m/hr   Weakness -Multifactorial sepsis, rhabdomyolysis, bacteremia, COVID-19 infection -Treat underlying cause -PT/OT to eval for weakness    DM type II uncontrolled with Hyperglycemia/DM retinopathy -1/19 A1C =at 8.5,  CBG (last 3)  Recent Labs    04/20/22 2356 04/21/22 0410 04/21/22 0824  GLUCAP 91 139* 139*   -Hold metfromin during hospitalization -Increase Semglee to 20 units BID -NovoLog 10 units qac     Atrial fibrillation (HCC) --PAF in 11/21 in setting acute illness requiring hospitalization  -- Currently NSR and followed by cardiology  -Cardizem 180 mg daily -Lisinopril 40 mg daily (hold) -Eliquis (Hold) 2dary Hematuria and Anemia     Essential hypertension -see A-fib     Dyslipidemia -Hold Lipitor while patient is on Paxlovid   Iron deficiency anemia Stable, continue to monitor    BPH with urinary obstruction Elevated bph,  followed by urology  -Hold Flomax for few days while patient is on Paxlovid due to drug interaction   Weakness -PT/OT evaluation  Obesity (BMI32.37 kg/m.) - Address with PCP upon discharge  Constipation -Lactulose 30 g TID  Hypokalemia - Potassium goal> 4 - 1/22 K-Dur 40 mEq x2  Hyponatremia - Mild, asymptomatic - 1/23 sodium phosphate IV 15 mmol  Hypophosphatemia - Phosphorus goal > 2.5 - 9/23 see hyponatremia   Insomnia - 1/23 Melatonin 10 mg qhs   Stage II sacral decubitus ulcer Pressure Injury 04/18/22 Sacrum Left;Right Stage 2 -  Partial thickness loss of dermis presenting as a shallow open injury with a red, pink wound bed without slough. (Active)  04/18/22 1358  Location: Sacrum  Location Orientation: Left;Right  Staging: Stage 2 -  Partial thickness loss of dermis presenting as a shallow open injury with a red, pink wound bed without slough.  Wound Description (Comments):   Present on Admission: Yes  Dressing Type Foam - Lift dressing to assess site every shift 04/20/22 2115        Mobility Assessment (last 72 hours)     Mobility Assessment     Row Name 04/20/22 2115 04/20/22 1601 04/20/22 1107 04/19/22 2159 04/19/22 1518   Does patient have an order for bedrest or is patient medically unstable No - Continue assessment No - Continue assessment -- No - Continue assessment No - Continue assessment   What is the highest level of mobility based on the progressive mobility assessment? Level 5 (Walks with assist in room/hall) - Balance while stepping forward/back and can walk in room with assist - Complete Level 5 (Walks with assist in room/hall) - Balance while stepping forward/back and can walk in room with assist - Complete Level 5 (Walks with assist in room/hall) - Balance while stepping forward/back and can walk in room with assist - Complete Level 5 (Walks with assist in room/hall) - Balance while stepping forward/back and can walk in room with assist -  Complete Level 5 (Walks with assist in room/hall) - Balance while stepping forward/back and can walk in room with assist - Complete   Is the above level different from baseline mobility prior to current illness? Yes - Recommend PT order Yes - Recommend PT order -- Yes - Recommend PT order Yes - Recommend PT order    [place] 04/19/22 1258 04/19/22 0900 04/18/22 1400       Does patient  have an order for bedrest or is patient medically unstable -- No - Continue assessment No - Continue assessment     What is the highest level of mobility based on the progressive mobility assessment? Level 5 (Walks with assist in room/hall) - Balance while stepping forward/back and can walk in room with assist - Complete Level 4 (Walks with assist in room) - Balance while marching in place and cannot step forward and back - Complete Level 3 (Stands with assist) - Balance while standing  and cannot march in place     Is the above level different from baseline mobility prior to current illness? -- Yes - Recommend PT order --                   Time: 50 minutes         Care during the described time interval was provided by me .  I have reviewed this patient's available data, including medical history, events of note, physical examination, and all test results as part of my evaluation.

## 2022-04-21 NOTE — Inpatient Diabetes Management (Signed)
Inpatient Diabetes Program Recommendations  AACE/ADA: New Consensus Statement on Inpatient Glycemic Control (2015)  Target Ranges:  Prepandial:   less than 140 mg/dL      Peak postprandial:   less than 180 mg/dL (1-2 hours)      Critically ill patients:  140 - 180 mg/dL   Lab Results  Component Value Date   GLUCAP 188 (H) 04/21/2022   HGBA1C 8.5 (H) 04/17/2022    Review of Glycemic Control  Latest Reference Range & Units 04/21/22 04:10 04/21/22 08:24 04/21/22 11:29  Glucose-Capillary 70 - 99 mg/dL 139 (H) 139 (H) 188 (H)  (H): Data is abnormally high Diabetes history: Type 2 DM Outpatient Diabetes medications: Lantus 15 units BID, Rybelsus 3 mg QD, Metformin 1000 mg BID Current orders for Inpatient glycemic control: Semglee 20 units BID, Novolog 10 units TID, Novolog 0-20 units Q4H  Inpatient Diabetes Program Recommendations:    Consider changing correction to Novolog 0-9 units TID  & HS.  Noted hypoglycemia yesterday follow total of 30 units of Novolog.  However, correction was given almost 3 hours following previous CBG. To reduce risk of hypoglycemia please ensure correction given within one hour of previous CBG.   Thanks, Bronson Curb, MSN, RNC-OB Diabetes Coordinator [phone] (8a-5p)

## 2022-04-22 DIAGNOSIS — E113553 Type 2 diabetes mellitus with stable proliferative diabetic retinopathy, bilateral: Secondary | ICD-10-CM | POA: Diagnosis not present

## 2022-04-22 DIAGNOSIS — A419 Sepsis, unspecified organism: Secondary | ICD-10-CM | POA: Diagnosis not present

## 2022-04-22 DIAGNOSIS — U071 COVID-19: Secondary | ICD-10-CM | POA: Diagnosis not present

## 2022-04-22 DIAGNOSIS — M6282 Rhabdomyolysis: Secondary | ICD-10-CM | POA: Diagnosis not present

## 2022-04-22 DIAGNOSIS — I10 Essential (primary) hypertension: Secondary | ICD-10-CM | POA: Diagnosis not present

## 2022-04-22 LAB — CBC WITH DIFFERENTIAL/PLATELET
Abs Immature Granulocytes: 0.05 10*3/uL (ref 0.00–0.07)
Basophils Absolute: 0 10*3/uL (ref 0.0–0.1)
Basophils Relative: 0 %
Eosinophils Absolute: 0 10*3/uL (ref 0.0–0.5)
Eosinophils Relative: 0 %
HCT: 35.2 % — ABNORMAL LOW (ref 39.0–52.0)
Hemoglobin: 11.7 g/dL — ABNORMAL LOW (ref 13.0–17.0)
Immature Granulocytes: 1 %
Lymphocytes Relative: 6 %
Lymphs Abs: 0.6 10*3/uL — ABNORMAL LOW (ref 0.7–4.0)
MCH: 29.3 pg (ref 26.0–34.0)
MCHC: 33.2 g/dL (ref 30.0–36.0)
MCV: 88.2 fL (ref 80.0–100.0)
Monocytes Absolute: 1 10*3/uL (ref 0.1–1.0)
Monocytes Relative: 9 %
Neutro Abs: 9.2 10*3/uL — ABNORMAL HIGH (ref 1.7–7.7)
Neutrophils Relative %: 84 %
Platelets: 160 10*3/uL (ref 150–400)
RBC: 3.99 MIL/uL — ABNORMAL LOW (ref 4.22–5.81)
RDW: 14.9 % (ref 11.5–15.5)
WBC: 10.9 10*3/uL — ABNORMAL HIGH (ref 4.0–10.5)
nRBC: 0 % (ref 0.0–0.2)

## 2022-04-22 LAB — COMPREHENSIVE METABOLIC PANEL
ALT: 65 U/L — ABNORMAL HIGH (ref 0–44)
AST: 97 U/L — ABNORMAL HIGH (ref 15–41)
Albumin: 2.5 g/dL — ABNORMAL LOW (ref 3.5–5.0)
Alkaline Phosphatase: 53 U/L (ref 38–126)
Anion gap: 11 (ref 5–15)
BUN: 23 mg/dL (ref 8–23)
CO2: 20 mmol/L — ABNORMAL LOW (ref 22–32)
Calcium: 8.2 mg/dL — ABNORMAL LOW (ref 8.9–10.3)
Chloride: 103 mmol/L (ref 98–111)
Creatinine, Ser: 1.35 mg/dL — ABNORMAL HIGH (ref 0.61–1.24)
GFR, Estimated: 55 mL/min — ABNORMAL LOW (ref >60)
Glucose, Bld: 155 mg/dL — ABNORMAL HIGH (ref 70–99)
Potassium: 3.6 mmol/L (ref 3.5–5.1)
Sodium: 134 mmol/L — ABNORMAL LOW (ref 135–145)
Total Bilirubin: 0.8 mg/dL (ref 0.3–1.2)
Total Protein: 6.1 g/dL — ABNORMAL LOW (ref 6.5–8.1)

## 2022-04-22 LAB — CK TOTAL AND CKMB (NOT AT ARMC)
CK, MB: 4.2 ng/mL (ref 0.5–5.0)
Relative Index: 0.7 (ref 0.0–2.5)
Total CK: 639 U/L — ABNORMAL HIGH (ref 49–397)

## 2022-04-22 LAB — D-DIMER, QUANTITATIVE: D-Dimer, Quant: 3.71 ug/mL-FEU — ABNORMAL HIGH (ref 0.00–0.50)

## 2022-04-22 LAB — PHOSPHORUS: Phosphorus: 2.4 mg/dL — ABNORMAL LOW (ref 2.5–4.6)

## 2022-04-22 LAB — PROCALCITONIN: Procalcitonin: 5.77 ng/mL

## 2022-04-22 LAB — GLUCOSE, CAPILLARY
Glucose-Capillary: 128 mg/dL — ABNORMAL HIGH (ref 70–99)
Glucose-Capillary: 143 mg/dL — ABNORMAL HIGH (ref 70–99)
Glucose-Capillary: 145 mg/dL — ABNORMAL HIGH (ref 70–99)
Glucose-Capillary: 153 mg/dL — ABNORMAL HIGH (ref 70–99)
Glucose-Capillary: 159 mg/dL — ABNORMAL HIGH (ref 70–99)
Glucose-Capillary: 94 mg/dL (ref 70–99)

## 2022-04-22 LAB — C-REACTIVE PROTEIN: CRP: 17.1 mg/dL — ABNORMAL HIGH (ref <1.0)

## 2022-04-22 LAB — MAGNESIUM: Magnesium: 1.8 mg/dL (ref 1.7–2.4)

## 2022-04-22 LAB — SEDIMENTATION RATE: Sed Rate: 66 mm/hr — ABNORMAL HIGH (ref 0–16)

## 2022-04-22 LAB — LACTATE DEHYDROGENASE: LDH: 181 U/L (ref 98–192)

## 2022-04-22 LAB — FERRITIN: Ferritin: 280 ng/mL (ref 24–336)

## 2022-04-22 MED ORDER — DILTIAZEM HCL 30 MG PO TABS
30.0000 mg | ORAL_TABLET | Freq: Once | ORAL | Status: AC
  Administered 2022-04-22: 30 mg via ORAL
  Filled 2022-04-22: qty 1

## 2022-04-22 MED ORDER — LACTATED RINGERS IV SOLN: INTRAVENOUS | Status: DC

## 2022-04-22 MED ORDER — IOHEXOL 9 MG/ML PO SOLN
ORAL | Status: AC
  Administered 2022-04-22: 500 mL via ORAL
  Filled 2022-04-22: qty 1000

## 2022-04-22 MED ORDER — IOHEXOL 9 MG/ML PO SOLN
500.0000 mL | ORAL | Status: AC
  Administered 2022-04-22: 500 mL via ORAL

## 2022-04-22 MED ORDER — CEFADROXIL 500 MG PO CAPS
1000.0000 mg | ORAL_CAPSULE | Freq: Two times a day (BID) | ORAL | Status: AC
  Administered 2022-04-23 – 2022-04-27 (×8): 1000 mg via ORAL
  Filled 2022-04-22 (×9): qty 2

## 2022-04-22 MED ORDER — DILTIAZEM HCL ER COATED BEADS 240 MG PO CP24
240.0000 mg | ORAL_CAPSULE | Freq: Every day | ORAL | Status: DC
  Administered 2022-04-23 – 2022-04-27 (×5): 240 mg via ORAL
  Filled 2022-04-22 (×5): qty 1

## 2022-04-22 NOTE — Progress Notes (Signed)
Physical Therapy Treatment Patient Details Name: David Sharp MRN: [id number] DOB: 10-24-1945 Today's Date: 04/22/2022   History of Present Illness Patient is a 77 year old male presenting to [facility] ED on 04/17/22 after slipping out of bed, found to be (+)COVID. Pt also with sepsis and rhabdomyolysis.  PMH: BPH, GERD, indwelling foley, HLD, HTN, DM, afib    PT Comments    Pt seen for PT tx with pt agreeable. Pt reports attempt at swallowing pills but difficulty doing so. Nurse made aware & provided pt with applesauce but pt still requiring extra time to take pills with applesauce (nurse informed). Pt agreeable to mobility despite appearing feeling unwell. Pt is able to complete transfer with CGA with extra time & ambulate in room with RW. Ambulation distances limited by elevated HR with mobility, although pt denies any adverse symptoms & PT also educated pt on need to take seated rest break between gait trials. Discussed possibility of rehab upon d/c but pt adamant about d/c home reporting his wife will be there to assist & has 3 children that live close by & check in on him. Will continue to follow pt acutely to address endurance, balance, and gait with LRAD.    Recommendations for follow up therapy are one component of a multi-disciplinary discharge planning process, led by the attending physician.  Recommendations may be updated based on patient status, additional functional criteria and insurance authorization.  Follow Up Recommendations  Home health PT     Assistance Recommended at Discharge Intermittent Supervision/Assistance  Patient can return home with the following A little help with walking and/or transfers;A little help with bathing/dressing/bathroom;Assistance with cooking/housework;Help with stairs or ramp for entrance   Equipment Recommendations  None recommended by PT (pt reports he has a RW at home)    Recommendations for Other Services       Precautions / Restrictions  Precautions Precautions: Fall Precaution Comments: monitor HR Restrictions Weight Bearing Restrictions: No     Mobility  Bed Mobility               General bed mobility comments: not tested, pt received & left sitting in recliner    Transfers Overall transfer level: Needs assistance Equipment used: Rolling walker (2 wheels) Transfers: Sit to/from Stand Sit to Stand: Min guard           General transfer comment: extra time & effort to power up to standing    Ambulation/Gait Ambulation/Gait assistance: Supervision Gait Distance (Feet): 50 Feet (+ 28 ft) Assistive device: Rolling walker (2 wheels) Gait Pattern/deviations: Decreased step length - right, Decreased step length - left, Decreased stride length, Decreased dorsiflexion - right, Decreased dorsiflexion - left, Trunk flexed Gait velocity: decreased     General Gait Details: Pt ambulates with RW pushed out in front of him, requires extra time for gait & turning. No overt LOB. Pt with incontinent void during gait despite pt reporting he's continent at home; educated pt on possible use of briefs upon d/c home.   Stairs             Wheelchair Mobility    Modified Rankin (Stroke Patients Only)       Balance Overall balance assessment: Needs assistance Sitting-balance support: No upper extremity supported, Feet supported Sitting balance-Leahy Scale: Good     Standing balance support: During functional activity, Bilateral upper extremity supported, Reliant on assistive device for balance Standing balance-Leahy Scale: Fair  Cognition Arousal/Alertness: Awake/alert Behavior During Therapy: WFL for tasks assessed/performed, Flat affect Overall Cognitive Status: Within Functional Limits for tasks assessed                                          Exercises      General Comments General comments (skin integrity, edema, etc.): Max HR 158 bpm  during gait, decreased to as low as 118 bpm with seated rest      Pertinent Vitals/Pain Pain Assessment Pain Assessment: Faces Faces Pain Scale: No hurt    Home Living                          Prior Function            PT Goals (current goals can now be found in the care plan section) Acute Rehab PT Goals Patient Stated Goal: return home PT Goal Formulation: With patient Time For Goal Achievement: 05/03/22 Potential to Achieve Goals: Good Progress towards PT goals: Progressing toward goals    Frequency    Min 3X/week      PT Plan Current plan remains appropriate    Co-evaluation              AM-PAC PT "6 Clicks" Mobility   Outcome Measure  Help needed turning from your back to your side while in a flat bed without using bedrails?: None Help needed moving from lying on your back to sitting on the side of a flat bed without using bedrails?: A Lot Help needed moving to and from a bed to a chair (including a wheelchair)?: A Little Help needed standing up from a chair using your arms (e.g., wheelchair or bedside chair)?: A Little Help needed to walk in hospital room?: A Little Help needed climbing 3-5 steps with a railing? : A Little 6 Click Score: 18    End of Session   Activity Tolerance: Treatment limited secondary to medical complications (Comment) (gait distances limited by elevated HR with mobility) Patient left: in chair;with chair alarm set;with call bell/phone within reach Nurse Communication: Mobility status PT Visit Diagnosis: Muscle weakness (generalized) (M62.81);Unsteadiness on feet (R26.81)     Time: 4497-5300 PT Time Calculation (min) (ACUTE ONLY): 23 min  Charges:  $Therapeutic Activity: 23-37 mins                     David Sharp, PT, DPT 04/22/22, 10:47 AM  David Sharp 04/22/2022, 10:44 AM

## 2022-04-22 NOTE — Progress Notes (Signed)
Occupational Therapy Treatment Patient Details Name: David Sharp MRN: [id number] DOB: 1945-10-30 Today's Date: 04/22/2022   History of present illness Patient is a 77 year old male presenting to [facility] ED on 04/17/22 after slipping out of bed, found to be (+)COVID. Pt also with sepsis and rhabdomyolysis.  PMH: BPH, GERD, indwelling foley, HLD, HTN, DM, afib   OT comments  Patient continues to present with generalized weakness and decreased activity tolerance. He reports muscle soreness all over. Patient needed min assist for toileting task after being found to have small smear of BM incontinence. Typical ability to clean himself limited by the failure of male purewick at the same time resulting in a puddle of urine on the floor and on his socks -- thus therapist helped him clean up. Once in recliner patient able to don and doff socks with significant increase in time. Patient left in recliner to eat breakfast. Of note -- therapist noticed 20 minutes after treatment that patient's HR elevated in 150s on telemetry. Will monitor next treatment.    Recommendations for follow up therapy are one component of a multi-disciplinary discharge planning process, led by the attending physician.  Recommendations may be updated based on patient status, additional functional criteria and insurance authorization.    Follow Up Recommendations  Home health OT     Assistance Recommended at Discharge Intermittent Supervision/Assistance  Patient can return home with the following  Assistance with cooking/housework;Assist for transportation;Help with stairs or ramp for entrance   Equipment Recommendations  None recommended by OT    Recommendations for Other Services      Precautions / Restrictions Precautions Precautions: Fall Precaution Comments: monitor HR Restrictions Weight Bearing Restrictions: No       Mobility Bed Mobility Overal bed mobility: Needs Assistance Bed Mobility: Supine to Sit      Supine to sit: Mod assist     General bed mobility comments: Mod assist for trunk to transfer into sitting at edge of bed.    Transfers                         Balance Overall balance assessment: Needs assistance Sitting-balance support: No upper extremity supported, Feet supported Sitting balance-Leahy Scale: Good     Standing balance support: During functional activity Standing balance-Leahy Scale: Fair                             ADL either performed or assessed with clinical judgement   ADL Overall ADL's : Needs assistance/impaired Eating/Feeding: Independent Eating/Feeding Details (indicate cue type and reason): in recliner. Grooming: Dance movement psychotherapist;Wash/dry hands;Set up;Sitting Grooming Details (indicate cue type and reason): at edge of bed. Patient bent over knees             Lower Body Dressing: Set up;Sitting/lateral leans Lower Body Dressing Details (indicate cue type and reason): Patient donned and doffed socks with significant increase in time seated in recliner             Functional mobility during ADLs: Min guard;Rolling walker (2 wheels) General ADL Comments: Min guard to stand and transfer to reclner. Male Purewick failed in standing - resulting in urine on the floor and on patient's socks. Patient also found to have small BM in bed. Therapist assisted patietn wtih cleaning up while patient stood and tried to not step in urine.    Extremity/Trunk Assessment Upper Extremity Assessment Upper Extremity Assessment: Overall  WFL for tasks assessed   Lower Extremity Assessment Lower Extremity Assessment: Defer to PT evaluation   Cervical / Trunk Assessment Cervical / Trunk Assessment: Kyphotic    Vision Patient Visual Report: No change from baseline     Perception     Praxis      Cognition Arousal/Alertness: Awake/alert Behavior During Therapy: WFL for tasks assessed/performed Overall Cognitive Status: Within Functional  Limits for tasks assessed                                          Exercises      Shoulder Instructions       General Comments      Pertinent Vitals/ Pain       Pain Assessment Pain Assessment: Faces Faces Pain Scale: Hurts little more Pain Location: generalized muscle sorenes Pain Descriptors / Indicators: Discomfort, Sore Pain Intervention(s): Limited activity within patient's tolerance, Monitored during session  Home Living                                          Prior Functioning/Environment              Frequency  Min 2X/week        Progress Toward Goals  OT Goals(current goals can now be found in the care plan section)  Progress towards OT goals: Progressing toward goals  Acute Rehab OT Goals Patient Stated Goal: to get stronger and go home OT Goal Formulation: With patient Time For Goal Achievement: 05/03/22 Potential to Achieve Goals: [place] Discharge plan remains appropriate    Co-evaluation                 AM-PAC OT "6 Clicks" Daily Activity     Outcome Measure   Help from another person eating meals?: None Help from another person taking care of personal grooming?: None Help from another person toileting, which includes using toliet, bedpan, or urinal?: A Little Help from another person bathing (including washing, rinsing, drying)?: A Little Help from another person to put on and taking off regular upper body clothing?: None Help from another person to put on and taking off regular lower body clothing?: A Little 6 Click Score: 21    End of Session Equipment Utilized During Treatment: Gait belt;Rolling walker (2 wheels)  OT Visit Diagnosis: Unsteadiness on feet (R26.81);Other abnormalities of gait and mobility (R26.89)   Activity Tolerance Patient tolerated treatment well   Patient Left in chair;with call bell/phone within reach   Nurse Communication Mobility status        Time:  9562-1308 OT Time Calculation (min): 17 min  Charges: OT General Charges $OT Visit: 1 Visit OT Treatments $Self Care/Home Management : 8-22 mins  Gustavo Lah, OTR/L [place]  Office [phone]   Lenward Chancellor 04/22/2022, 10:42 AM

## 2022-04-22 NOTE — Progress Notes (Signed)
[facility]                                                                              David Sharp, is a 77 y.o. male, DOB - Feb 06, 1946, YKZ:[id number] Admit date - 04/17/2022    Outpatient Primary MD for the patient is [name], MD  LOS - 4  days  Chief Complaint  Patient presents with   Weakness       Brief summary   Patient is a 77 year old male with diabetes mellitus type 2, IDA,HLD, HTN, BPH, afib who presented to ED with complaints of weakness and fever.  On the morning of admission, patient's son could not get him out of the bed, had slid down on the floor.  His wife had tested positive for COVID before.  He had a fever 103 F, no cough or congestion. Patient was admitted for further workup.   Assessment & Plan    Principal Problem:   Severe Sepsis ([place]) POA, with Klebsiella pneumonia bacteremia -Patient met sepsis criteria on admission with fevers, tachycardia, leukocytosis, lactic acidosis, AKI -Blood cultures positive for Klebsiella bacteremia although UA and culture + 70K  staph aureus -Repeat blood cultures, procalcitonin, continue IV Rocephin, ID consulted   Active Problems:  Generalized debility due to COVID-19  -Currently improving, O2 sats 94% on room air, CXR on 1/19 with no infiltrates -CRP trending down, 17.1, procalcitonin trending down, D-dimer 3.71 -Completed Paxlovid x 5 days -Continue PT OT  Acute rhabdomyolysis -Likely due to being on the floor. CK increased to 4876. -CK now improving, 639, creatinine improving, 1.3, will decrease IV fluid to 75 cc an hour -Continue to hold Lipitor    Type 2 diabetes mellitus with stable proliferative retinopathy of both eyes, without long-term current use of insulin (HCC) - A1C at 8.5 -Continue to hold metformin while inpatient CBG (last 3)  Recent Labs    04/21/22 2329 04/22/22 0443 04/22/22 0853  GLUCAP 93 153* 128*  -Continue Semglee 20 units twice daily, NovoLog 10 units 3  times daily AC, resistant SSI      Atrial fibrillation (HCC) PAF in 11/21 in setting acute illness requiring hospitalization  -Continue Cardizem, HR still elevated, will increase Cardizem to 240 mg daily -Was followed by cardiology, was taken off of Eliquis due to hematuria and anemia --zio showed no episodes of afib    Essential hypertension -BP currently stable, continue Cardizem, hold ACE   Dyslipidemia -Hold Lipitor while  CK improving   Iron deficiency anemia H&H stable   BPH with urinary obstruction Elevated bph, followed by urology  -Has completed Paxlovid, will resume Flomax      Sacral decubitus ulcer, stage II ([place]) -POA -Wound care per nursing    Obesity (BMI 30-39.9)  Estimated body mass index is 32.37 kg/m as calculated from the following:   Height as of this encounter: 5' 11.75" (1.822 m).   Weight as of this encounter: 107.5 kg.  Code Status: Full CODE STATUS DVT Prophylaxis:  enoxaparin (LOVENOX) injection 40 mg Start: 04/17/22 2359   Level of Care: Level of care: Telemetry Family Communication: Updated patient Disposition Plan:  Remains inpatient appropriate: Hopefully DC home in next 24 to 48 hours   Procedures:    Consultants:     Antimicrobials:   Anti-infectives (From admission, onward)    Start     Dose/Rate Route Frequency Ordered Stop   04/18/22 1000  cefTRIAXone (ROCEPHIN) 2 g in sodium chloride 0.9 % 100 mL IVPB        2 g 200 mL/hr over 30 Minutes Intravenous Every 24 hours 04/18/22 0900     04/17/22 2330  nirmatrelvir/ritonavir (PAXLOVID) 3 tablet        3 tablet Oral 2 times daily 04/17/22 2327 04/22/22 7829          Medications  vitamin C  500 mg Oral Daily   aspirin EC  81 mg Oral Daily   cholecalciferol  5,000 Units Oral Daily   diltiazem  180 mg Oral Daily   enoxaparin (LOVENOX) injection  40 mg Subcutaneous Q24H   insulin aspart  0-20 Units Subcutaneous Q4H   insulin aspart  10 Units Subcutaneous TID WC    insulin glargine-yfgn  20 Units Subcutaneous BID   lactulose  30 g Oral TID   melatonin  10 mg Oral QHS   multivitamin with minerals  1 tablet Oral Daily   [START ON 04/23/2022] tamsulosin  0.4 mg Oral Daily   zinc sulfate  220 mg Oral Daily      Subjective:   David Sharp was seen and examined today.  Feeling somewhat better however still having difficulty sleeping.  Feels tired and weak.  Patient denies dizziness, chest pain, shortness of breath, abdominal pain, N/V.  Objective:   Vitals:   04/21/22 1341 04/21/22 2155 04/22/22 0010 04/22/22 0446  BP: 124/71 (!) 153/77 (!) 155/93 (!) 139/90  Pulse: 84 84 100 98  Resp: '16 17 17 17  '$ Temp: 98.6 F (37 C) 98.5 F (36.9 C) 98.8 F (37.1 C) 98 F (36.7 C)  TempSrc: Oral Oral Oral Oral  SpO2: 94% 97% 94% 94%  Weight:      Height:        Intake/Output Summary (Last 24 hours) at 04/22/2022 1148 Last data filed at 04/22/2022 0447 Gross per 24 hour  Intake --  Output 1250 ml  Net -1250 ml     Wt Readings from Last 3 Encounters:  04/17/22 107.5 kg  03/02/20 115.2 kg  01/31/20 113.4 kg     Exam General: Alert and oriented x 3, NAD Cardiovascular: S1 S2 auscultated,  RRR Respiratory: Clear to auscultation bilaterally, no wheezing Gastrointestinal: Soft, nontender, nondistended, + bowel sounds Ext: no pedal edema bilaterally Neuro: Strength 5/5 upper and lower extremities bilaterally Skin: No rashes Psych: Normal affect and demeanor, alert and oriented x3     Data Reviewed:  I have personally reviewed following labs    CBC Lab Results  Component Value Date   WBC 10.9 (H) 04/22/2022   RBC 3.99 (L) 04/22/2022   HGB 11.7 (L) 04/22/2022   HCT 35.2 (L) 04/22/2022   MCV 88.2 04/22/2022   MCH 29.3 04/22/2022   PLT 160 04/22/2022   MCHC 33.2 04/22/2022   RDW 14.9 04/22/2022   LYMPHSABS 0.6 (L) 04/22/2022   MONOABS 1.0 04/22/2022   EOSABS 0.0 04/22/2022   BASOSABS 0.0 56/21/3086     Last metabolic panel Lab  Results  Component Value Date   NA 134 (L) 04/22/2022   K 3.6 04/22/2022   CL 103 04/22/2022   CO2 20 (L) 04/22/2022   BUN 23 04/22/2022  CREATININE 1.35 (H) 04/22/2022   GLUCOSE 155 (H) 04/22/2022   GFRNONAA 55 (L) 04/22/2022   CALCIUM 8.2 (L) 04/22/2022   PHOS 2.4 (L) 04/22/2022   PROT 6.1 (L) 04/22/2022   ALBUMIN 2.5 (L) 04/22/2022   BILITOT 0.8 04/22/2022   ALKPHOS 53 04/22/2022   AST 97 (H) 04/22/2022   ALT 65 (H) 04/22/2022   ANIONGAP 11 04/22/2022    CBG (last 3)  Recent Labs    04/21/22 2329 04/22/22 0443 04/22/22 0853  GLUCAP 93 153* 128*      Coagulation Profile: No results for input(s): "INR", "PROTIME" in the last 168 hours.   Radiology Studies: I have personally reviewed the imaging studies  No results found.     Estill Cotta M.D. [facility] 04/22/2022, 11:48 AM  Available via Epic secure chat 7am-7pm After 7 pm, please refer to night coverage provider listed on amion.

## 2022-04-22 NOTE — Care Management Important Message (Signed)
Important Message  Patient Details IM Letter given. Name: David Sharp MRN: [id number] Date of Birth: March 17, 1946   Medicare Important Message Given:  Yes     Kerin Salen 04/22/2022, 12:41 PM

## 2022-04-22 NOTE — Consult Note (Signed)
[facility]    Date of Admission:  04/17/2022   Total days of inpatient antibiotics 5        Reason for Consult: Kleb pneumo bacteremia    Principal Problem:   Sepsis ([place]) Active Problems:   BPH with urinary obstruction   Essential hypertension   Atrial fibrillation (HCC)   Dyslipidemia   Iron deficiency anemia   Type 2 diabetes mellitus with stable proliferative retinopathy of both eyes, without long-term current use of insulin (HCC)   weakness and sepsis criteria due to COVID-19   Weakness   Pressure injury of skin   Rhabdomyolysis   Sacral decubitus ulcer, stage II (HCC)   Bacteremia due to Klebsiella pneumoniae   Obesity (BMI 30-39.9)   Assessment: 77 year old male with history of A-fib, diabetes mellitus presented to with temp 100.6 from home.  Found to have COVID-19 with negative chest x-[place].  Son brought him in as patient could not get out of bed.  Hospital course complicated by Klebsiella pneumonia bacteremia treated with ceftriaxone.  Infectious disease engaged for antibiotic recommendations.  #Kleb pneumoniae bacteremia 2/2 unclear source  -UA+ 6-10 squam, moderate leukocytes, 21-50 wbc with Cx+ 70k MSSA colonies. Given swams present would regard MSSA it as containment rather than overflow from another source -I examined his skin including decubitus ulcer and di not see any signs of skin soft tissue infection. -Pt reprots abdominal cramping that has been gong on for awhile. I'll get CT to look for any foci of infection. Although he was eating cereal w/o difficulty this AM Recommendations:  -CT abdomen pelvis to look for foci of infection -D/C ceftriaxone  -Start cefadroxil to complete 7 days of antibiotics from 1/19 EOT 1/26 if CT is unremarkable   #DM -A1c 8.5 on 04/17/22  #Afib -management per primary  I have personally spent 80 minutes involved in face-to-face and non-face-to-face activities for this patient on the day of  the visit. Professional time spent includes the following activities: Preparing to see the patient (review of tests), Obtaining and/or reviewing separately obtained history (admission/discharge record), Performing a medically appropriate examination and/or evaluation , Ordering medications/tests/procedures, referring and communicating with other health care professionals, Documenting clinical information in the EMR, Independently interpreting results (not separately reported), Communicating results to the patient/family/caregiver, Counseling and educating the patient/family/caregiver and Care coordination (not separately reported).   Microbiology:   Antibiotics: Ceftriaxone 1/20-   Cultures: Blood 1/19 2/2 kleb penueumo Urine 1/20 urine cx+ MSSA Other   HPI: ABEM David Sharp is a 77 y.o. male with past medical history for DM type II, IDA, hyperlipidemia, hypertension, BPH, A-fib presented to the ED with complaints of weakness and fever.  Son could not get him out of bed in the a.m. of admission.  Noted temp of 103.  On arrival to the ED patient had temp 100.6 COVID+.  Chest x-[place] negative.  Hospital course complicated by Klebsiella bacteremia secondary to unclear etiology.  Patient has been on ceftriaxone.  Infectious disease engaged for antibiotic recommendations.   Review of Systems: Review of Systems  All other systems reviewed and are negative.   Past Medical History:  Diagnosis Date   Acute urinary retention    Arthritis    BPH (benign prostatic hyperplasia)    Foley catheter in place    GERD (gastroesophageal reflux disease)    Hyperlipidemia    Hypertension    Type 2 diabetes mellitus ([place])    Wears glasses  Social History   Tobacco Use   Smoking status: Never   Smokeless tobacco: Never  Vaping Use   Vaping Use: Never used  Substance Use Topics   Alcohol use: Yes    Comment: VERY RARE   Drug use: No    History reviewed. No pertinent family history. Scheduled  Meds:  vitamin C  500 mg Oral Daily   aspirin EC  81 mg Oral Daily   [START ON 04/23/2022] cefadroxil  1,000 mg Oral BID   cholecalciferol  5,000 Units Oral Daily   [START ON 04/23/2022] diltiazem  240 mg Oral Daily   diltiazem  30 mg Oral Once   enoxaparin (LOVENOX) injection  40 mg Subcutaneous Q24H   insulin aspart  0-20 Units Subcutaneous Q4H   insulin aspart  10 Units Subcutaneous TID WC   insulin glargine-yfgn  20 Units Subcutaneous BID   lactulose  30 g Oral TID   melatonin  10 mg Oral QHS   multivitamin with minerals  1 tablet Oral Daily   [START ON 04/23/2022] tamsulosin  0.4 mg Oral Daily   zinc sulfate  220 mg Oral Daily   Continuous Infusions:  lactated ringers     PRN Meds:.acetaminophen, albuterol, guaiFENesin-dextromethorphan Allergies  Allergen Reactions   Amoxicillin Hives and Itching    OBJECTIVE: Blood pressure (!) 139/90, pulse 98, temperature 98 F (36.7 C), temperature source Oral, resp. rate 17, height 5' 11.75" (1.822 m), weight 107.5 kg, SpO2 94 %.  Physical Exam Constitutional:      General: He is not in acute distress.    Appearance: He is normal weight. He is not toxic-appearing.  HENT:     Head: Normocephalic and atraumatic.     Right Ear: External ear normal.     Left Ear: External ear normal.     Nose: No congestion or rhinorrhea.     Mouth/Throat:     Mouth: Mucous membranes are moist.     Pharynx: Oropharynx is clear.  Eyes:     Extraocular Movements: Extraocular movements intact.     Conjunctiva/sclera: Conjunctivae normal.     Pupils: Pupils are equal, round, and reactive to light.  Cardiovascular:     Rate and Rhythm: Normal rate and regular rhythm.     Heart sounds: No murmur heard.    No friction rub. No gallop.  Pulmonary:     Effort: Pulmonary effort is normal.     Breath sounds: Normal breath sounds.  Abdominal:     General: Abdomen is flat. Bowel sounds are normal.     Palpations: Abdomen is soft.  Musculoskeletal:         General: No swelling. Normal range of motion.     Cervical back: Normal range of motion and neck supple.  Skin:    General: Skin is warm and dry.  Neurological:     General: No focal deficit present.     Mental Status: He is oriented to person, place, and time.  Psychiatric:        Mood and Affect: Mood normal.     Lab Results Lab Results  Component Value Date   WBC 10.9 (H) 04/22/2022   HGB 11.7 (L) 04/22/2022   HCT 35.2 (L) 04/22/2022   MCV 88.2 04/22/2022   PLT 160 04/22/2022    Lab Results  Component Value Date   CREATININE 1.35 (H) 04/22/2022   BUN 23 04/22/2022   NA 134 (L) 04/22/2022   K 3.6 04/22/2022   CL 103 04/22/2022  CO2 20 (L) 04/22/2022    Lab Results  Component Value Date   ALT 65 (H) 04/22/2022   AST 97 (H) 04/22/2022   ALKPHOS 53 04/22/2022   BILITOT 0.8 04/22/2022       Laurice Record, [facility] 04/22/2022, 12:34 PM

## 2022-04-23 ENCOUNTER — Inpatient Hospital Stay (HOSPITAL_COMMUNITY): Payer: PPO

## 2022-04-23 DIAGNOSIS — A419 Sepsis, unspecified organism: Secondary | ICD-10-CM | POA: Diagnosis not present

## 2022-04-23 DIAGNOSIS — U071 COVID-19: Secondary | ICD-10-CM | POA: Diagnosis not present

## 2022-04-23 DIAGNOSIS — R531 Weakness: Secondary | ICD-10-CM | POA: Diagnosis not present

## 2022-04-23 DIAGNOSIS — M6282 Rhabdomyolysis: Secondary | ICD-10-CM | POA: Diagnosis not present

## 2022-04-23 DIAGNOSIS — E113553 Type 2 diabetes mellitus with stable proliferative diabetic retinopathy, bilateral: Secondary | ICD-10-CM | POA: Diagnosis not present

## 2022-04-23 LAB — CBC WITH DIFFERENTIAL/PLATELET
Abs Immature Granulocytes: 0.08 10*3/uL — ABNORMAL HIGH (ref 0.00–0.07)
Basophils Absolute: 0 10*3/uL (ref 0.0–0.1)
Basophils Relative: 0 %
Eosinophils Absolute: 0.1 10*3/uL (ref 0.0–0.5)
Eosinophils Relative: 1 %
HCT: 33.4 % — ABNORMAL LOW (ref 39.0–52.0)
Hemoglobin: 11.1 g/dL — ABNORMAL LOW (ref 13.0–17.0)
Immature Granulocytes: 1 %
Lymphocytes Relative: 5 %
Lymphs Abs: 0.6 10*3/uL — ABNORMAL LOW (ref 0.7–4.0)
MCH: 29.1 pg (ref 26.0–34.0)
MCHC: 33.2 g/dL (ref 30.0–36.0)
MCV: 87.7 fL (ref 80.0–100.0)
Monocytes Absolute: 1 10*3/uL (ref 0.1–1.0)
Monocytes Relative: 8 %
Neutro Abs: 10 10*3/uL — ABNORMAL HIGH (ref 1.7–7.7)
Neutrophils Relative %: 85 %
Platelets: 209 10*3/uL (ref 150–400)
RBC: 3.81 MIL/uL — ABNORMAL LOW (ref 4.22–5.81)
RDW: 15.1 % (ref 11.5–15.5)
WBC: 11.8 10*3/uL — ABNORMAL HIGH (ref 4.0–10.5)
nRBC: 0 % (ref 0.0–0.2)

## 2022-04-23 LAB — GLUCOSE, CAPILLARY
Glucose-Capillary: 160 mg/dL — ABNORMAL HIGH (ref 70–99)
Glucose-Capillary: 235 mg/dL — ABNORMAL HIGH (ref 70–99)
Glucose-Capillary: 240 mg/dL — ABNORMAL HIGH (ref 70–99)
Glucose-Capillary: 47 mg/dL — ABNORMAL LOW (ref 70–99)
Glucose-Capillary: 104 mg/dL — ABNORMAL HIGH (ref 70–99)
Glucose-Capillary: 51 mg/dL — ABNORMAL LOW (ref 70–99)
Glucose-Capillary: 152 mg/dL — ABNORMAL HIGH (ref 70–99)

## 2022-04-23 LAB — CK TOTAL AND CKMB (NOT AT ARMC)
CK, MB: 2.7 ng/mL (ref 0.5–5.0)
Relative Index: 0.8 (ref 0.0–2.5)
Total CK: 318 U/L (ref 49–397)

## 2022-04-23 LAB — COMPREHENSIVE METABOLIC PANEL
ALT: 83 U/L — ABNORMAL HIGH (ref 0–44)
AST: 97 U/L — ABNORMAL HIGH (ref 15–41)
Albumin: 2.6 g/dL — ABNORMAL LOW (ref 3.5–5.0)
Alkaline Phosphatase: 62 U/L (ref 38–126)
Anion gap: 13 (ref 5–15)
BUN: 23 mg/dL (ref 8–23)
CO2: 18 mmol/L — ABNORMAL LOW (ref 22–32)
Calcium: 8.1 mg/dL — ABNORMAL LOW (ref 8.9–10.3)
Chloride: 105 mmol/L (ref 98–111)
Creatinine, Ser: 1.17 mg/dL (ref 0.61–1.24)
GFR, Estimated: >60 mL/min (ref >60)
Glucose, Bld: 156 mg/dL — ABNORMAL HIGH (ref 70–99)
Potassium: 3.6 mmol/L (ref 3.5–5.1)
Sodium: 136 mmol/L (ref 135–145)
Total Bilirubin: 0.8 mg/dL (ref 0.3–1.2)
Total Protein: 6 g/dL — ABNORMAL LOW (ref 6.5–8.1)

## 2022-04-23 LAB — PROCALCITONIN: Procalcitonin: 2.93 ng/mL

## 2022-04-23 LAB — LACTATE DEHYDROGENASE: LDH: 192 U/L (ref 98–192)

## 2022-04-23 LAB — FERRITIN: Ferritin: 277 ng/mL (ref 24–336)

## 2022-04-23 LAB — MAGNESIUM: Magnesium: 1.7 mg/dL (ref 1.7–2.4)

## 2022-04-23 LAB — SEDIMENTATION RATE: Sed Rate: 68 mm/hr — ABNORMAL HIGH (ref 0–16)

## 2022-04-23 LAB — C-REACTIVE PROTEIN: CRP: 14.5 mg/dL — ABNORMAL HIGH (ref <1.0)

## 2022-04-23 LAB — D-DIMER, QUANTITATIVE: D-Dimer, Quant: 4.21 ug/mL-FEU — ABNORMAL HIGH (ref 0.00–0.50)

## 2022-04-23 LAB — PHOSPHORUS: Phosphorus: 2.7 mg/dL (ref 2.5–4.6)

## 2022-04-23 MED ORDER — SODIUM CHLORIDE (PF) 0.9 % IJ SOLN
INTRAMUSCULAR | Status: AC
  Filled 2022-04-23: qty 50

## 2022-04-23 MED ORDER — INSULIN GLARGINE-YFGN 100 UNIT/ML ~~LOC~~ SOLN
22.0000 [IU] | Freq: Two times a day (BID) | SUBCUTANEOUS | Status: DC
  Filled 2022-04-23: qty 0.22

## 2022-04-23 MED ORDER — TAMSULOSIN HCL 0.4 MG PO CAPS
0.8000 mg | ORAL_CAPSULE | Freq: Every day | ORAL | Status: DC
  Administered 2022-04-23 – 2022-04-27 (×5): 0.8 mg via ORAL
  Filled 2022-04-23 (×5): qty 2

## 2022-04-23 MED ORDER — FINASTERIDE 5 MG PO TABS
5.0000 mg | ORAL_TABLET | Freq: Every day | ORAL | Status: DC
  Administered 2022-04-23 – 2022-04-27 (×5): 5 mg via ORAL
  Filled 2022-04-23 (×5): qty 1

## 2022-04-23 MED ORDER — INSULIN ASPART 100 UNIT/ML IJ SOLN
5.0000 [IU] | Freq: Three times a day (TID) | INTRAMUSCULAR | Status: DC
  Administered 2022-04-24 – 2022-04-26 (×7): 5 [IU] via SUBCUTANEOUS

## 2022-04-23 MED ORDER — IOHEXOL 300 MG/ML  SOLN
100.0000 mL | Freq: Once | INTRAMUSCULAR | Status: AC | PRN
  Administered 2022-04-23: 100 mL via INTRAVENOUS

## 2022-04-23 MED ORDER — FINASTERIDE 5 MG PO TABS: 5.0000 mg | ORAL_TABLET | Freq: Every day | ORAL | Status: DC

## 2022-04-23 MED ORDER — INSULIN ASPART 100 UNIT/ML IJ SOLN
0.0000 [IU] | Freq: Three times a day (TID) | INTRAMUSCULAR | Status: DC
  Administered 2022-04-24 (×2): 3 [IU] via SUBCUTANEOUS
  Administered 2022-04-25: 2 [IU] via SUBCUTANEOUS
  Administered 2022-04-25: 8 [IU] via SUBCUTANEOUS
  Administered 2022-04-25: 5 [IU] via SUBCUTANEOUS
  Administered 2022-04-26: 3 [IU] via SUBCUTANEOUS
  Administered 2022-04-26: 5 [IU] via SUBCUTANEOUS
  Administered 2022-04-26: 8 [IU] via SUBCUTANEOUS
  Administered 2022-04-27: 5 [IU] via SUBCUTANEOUS

## 2022-04-23 MED ORDER — INSULIN ASPART 100 UNIT/ML IJ SOLN
0.0000 [IU] | Freq: Every day | INTRAMUSCULAR | Status: DC
  Administered 2022-04-25: 2 [IU] via SUBCUTANEOUS

## 2022-04-23 MED ORDER — INSULIN GLARGINE-YFGN 100 UNIT/ML ~~LOC~~ SOLN
22.0000 [IU] | Freq: Every day | SUBCUTANEOUS | Status: DC
  Filled 2022-04-23: qty 0.22

## 2022-04-23 NOTE — Consult Note (Signed)
David Sharp 1945-05-14  [id number].    Requesting MD: Dr. Estill Cotta Chief Complaint/Reason for Consult: cholecystitis  HPI:  This is a 77 yo male with a history of A fib, DM, HTN, and HLD who "slid down" at home and remained on the floor for several hours as he was too weak to get up.  EMS came and got him and brought him to the ED.  He was found to have COVID and was admitted for further management of this.  He mostly just complains of weakness and fatigue.  He denies any fevers, CP, or SOB.  He does admit to a little bit of a cough.  He denies any abdominal pain, N/V, inability to tolerate a diet (eating a home and here with no biliary symptoms).  He states he has been voiding but was still having some lower abdominal pressure this morning while trying to sit up in a chair.  This is now much improved after foley placed and over 2L drained.  He has a mild elevation of his WBC at 11K.  He was also found to have a Klebsiella bacteremia.  A CT scan was ordered and this revealed : " 1. Highly abnormal gallbladder, with a large mixed density 5 cm calculus or mass within the lumen, and indistinct gallbladder wall. Adjacent bilobed 1.8 cm hypodense liver lesion along the medial anterior gallbladder fossa is nonspecific. Cannot exclude Cholecystitis with an adjacent small Liver Abscess.   2. Superimposed moderate to severe bilateral hydronephrosis and hydroureter with a dilated, 1800 mL Bladder. Consider Bladder Outlet Obstruction (note prostate hypertrophy with dilated prostatic urethra) versus Urinary Retention. However, difficult to exclude an 8 mm distal left ureteral calculus at the UVJ (series 2, image 87). Mild underlying nephrolithiasis."  We have been asked to see the patient for concern for cholecystitis.    ROS: ROS: Please see HPI  History reviewed. No pertinent family history.  Past Medical History:  Diagnosis Date   Acute urinary retention    Arthritis    BPH (benign  prostatic hyperplasia)    Foley catheter in place    GERD (gastroesophageal reflux disease)    Hyperlipidemia    Hypertension    Type 2 diabetes mellitus ([place])    Wears glasses     Past Surgical History:  Procedure Laterality Date   CATARACT EXTRACTION W/ INTRAOCULAR LENS IMPLANT Right 1990's   INGUINAL HERNIA REPAIR Right 2007  approx   RETINAL DETACHMENT SURGERY Bilateral last one , left eye 1997/  right eye 1990's   TRANSURETHRAL RESECTION OF PROSTATE N/A 01/27/2016   Procedure: TRANSURETHRAL RESECTION OF THE PROSTATE (TURP) WITH GYRUS;  Surgeon: Kathie Rhodes, MD;  Location: [facility];  Service: Urology;  Laterality: N/A;    Social History:  reports that he has never smoked. He has never used smokeless tobacco. He reports current alcohol use. He reports that he does not use drugs.  Allergies:  Allergies  Allergen Reactions   Amoxicillin Hives and Itching    Medications Prior to Admission  Medication Sig Dispense Refill   acetaminophen (TYLENOL) 500 MG tablet Take 1,000 mg by mouth every 6 (six) hours as needed for moderate pain.     Ascorbic Acid (VITAMIN C) 1000 MG tablet Take 1,000 mg by mouth daily.     aspirin EC 81 MG tablet Take 81 mg by mouth daily.     atorvastatin (LIPITOR) 20 MG tablet Take 1 tablet (20 mg total) by mouth  daily. 90 tablet 1   Cholecalciferol (VITAMIN D) 125 MCG (5000 UT) CAPS Take 5,000 Units by mouth daily.     fluconazole (DIFLUCAN) 200 MG tablet Take 200 mg by mouth once a week.     insulin glargine (LANTUS SOLOSTAR) 100 UNIT/ML Solostar Pen Inject 15 Units into the skin 2 (two) times daily. 15 mL 11   ipratropium (ATROVENT) 0.03 % nasal spray Place 2 sprays into both nostrils every 12 (twelve) hours.     lisinopril (ZESTRIL) 40 MG tablet Take 1 tablet by mouth daily.     metFORMIN (GLUCOPHAGE-XR) 500 MG 24 hr tablet Take 1,000 mg by mouth 2 (two) times daily with a meal.     Multiple Vitamin (MULTIVITAMIN) tablet Take 1 tablet  by mouth daily.     Omega-3 Fatty Acids (FISH OIL) 1200 MG CAPS Take 1 capsule by mouth in the morning, at noon, and at bedtime.      Semaglutide (RYBELSUS) 3 MG TABS Take 3 mg by mouth daily.     tamsulosin (FLOMAX) 0.4 MG CAPS capsule Take 1 capsule by mouth daily.     zinc gluconate 50 MG tablet Take 50 mg by mouth daily.     diltiazem (CARDIZEM CD) 180 MG 24 hr capsule Take 1 capsule (180 mg total) by mouth daily. (Patient not taking: Reported on 04/19/2022) 90 capsule 1   Insulin Pen Needle (PEN NEEDLES) 31G X 6 MM MISC Use to inject your insulin twice a day 100 each 1   metFORMIN (GLUCOPHAGE) 1000 MG tablet Take 1 tablet (1,000 mg total) by mouth 2 (two) times daily with a meal. 180 tablet 1     Physical Exam: Blood pressure (!) 149/84, pulse 74, temperature 99.1 F (37.3 C), temperature source Oral, resp. rate 16, height 5' 11.75" (1.822 m), weight 107.5 kg, SpO2 95 %. PE: General: pleasant, WD, WN white male who is laying in bed in NAD, but appears lethargic  HEENT: head is normocephalic, atraumatic.  Sclera are noninjected.  PERRL.  Ears and nose without any masses or lesions.  Mouth is pink and moist. Heart: irregular, tachy.  Normal s1,s2. No obvious murmurs, gallops, or rubs noted.  Palpable radial and pedal pulses bilaterally Lungs: CTAB, no wheezes, rhonchi, or rales noted.  Respiratory effort nonlabored Abd: soft, NT specifically in RUQ, but diffusely in his abdomen, ND, +BS, no masses, hernias, or organomegaly GU: foley placed with cloudy, slightly brown-yellow urine with significant sediment Skin: warm and dry with no masses, lesions, or rashes Psych: A&Ox3 with a depressed, lethargic type affect.  Slow to answer questions and not a great historian.  (States he is more "cloudy headed" from the [place])   Results for orders placed or performed during the hospital encounter of 04/17/22 (from the past 48 hour(s))  Glucose, capillary     Status: Abnormal   Collection Time:  04/21/22 11:29 AM  Result Value Ref Range   Glucose-Capillary 188 (H) 70 - 99 mg/dL    Comment: Glucose reference range applies only to samples taken after fasting for at least 8 hours.  Glucose, capillary     Status: None   Collection Time: 04/21/22  4:11 PM  Result Value Ref Range   Glucose-Capillary 95 70 - 99 mg/dL    Comment: Glucose reference range applies only to samples taken after fasting for at least 8 hours.  CK     Status: Abnormal   Collection Time: 04/21/22  4:58 PM  Result Value Ref Range  Total CK 673 (H) 49 - 397 U/L    Comment: Performed at [facility], [place] [address] [place], [place] [address]  Glucose, capillary     Status: None   Collection Time: 04/21/22  8:04 PM  Result Value Ref Range   Glucose-Capillary 99 70 - 99 mg/dL    Comment: Glucose reference range applies only to samples taken after fasting for at least 8 hours.  Glucose, capillary     Status: None   Collection Time: 04/21/22 11:29 PM  Result Value Ref Range   Glucose-Capillary 93 70 - 99 mg/dL    Comment: Glucose reference range applies only to samples taken after fasting for at least 8 hours.  Glucose, capillary     Status: Abnormal   Collection Time: 04/22/22  4:43 AM  Result Value Ref Range   Glucose-Capillary 153 (H) 70 - 99 mg/dL    Comment: Glucose reference range applies only to samples taken after fasting for at least 8 hours.   Comment 1 Notify RN    Comment 2 Document in Chart   CBC with Differential/Platelet     Status: Abnormal   Collection Time: 04/22/22  6:05 AM  Result Value Ref Range   WBC 10.9 (H) 4.0 - 10.5 K/uL   RBC 3.99 (L) 4.22 - 5.81 MIL/uL   Hemoglobin 11.7 (L) 13.0 - 17.0 g/dL   HCT 35.2 (L) 39.0 - 52.0 %   MCV 88.2 80.0 - 100.0 fL   MCH 29.3 26.0 - 34.0 pg   MCHC 33.2 30.0 - 36.0 g/dL   RDW 14.9 11.5 - 15.5 %   Platelets 160 150 - 400 K/uL   nRBC 0.0 0.0 - 0.2 %   Neutrophils Relative % 84 %   Neutro Abs 9.2 (H) 1.7 - 7.7 K/uL   Lymphocytes  Relative 6 %   Lymphs Abs 0.6 (L) 0.7 - 4.0 K/uL   Monocytes Relative 9 %   Monocytes Absolute 1.0 0.1 - 1.0 K/uL   Eosinophils Relative 0 %   Eosinophils Absolute 0.0 0.0 - 0.5 K/uL   Basophils Relative 0 %   Basophils Absolute 0.0 0.0 - 0.1 K/uL   Immature Granulocytes 1 %   Abs Immature Granulocytes 0.05 0.00 - 0.07 K/uL    Comment: Performed at [facility], [place] [address], [place], [place] [address]  Comprehensive metabolic panel     Status: Abnormal   Collection Time: 04/22/22  6:05 AM  Result Value Ref Range   Sodium 134 (L) 135 - 145 mmol/L   Potassium 3.6 3.5 - 5.1 mmol/L   Chloride 103 98 - 111 mmol/L   CO2 20 (L) 22 - 32 mmol/L   Glucose, Bld 155 (H) 70 - 99 mg/dL    Comment: Glucose reference range applies only to samples taken after fasting for at least 8 hours.   BUN 23 8 - 23 mg/dL   Creatinine, Ser 1.35 (H) 0.61 - 1.24 mg/dL   Calcium 8.2 (L) 8.9 - 10.3 mg/dL   Total Protein 6.1 (L) 6.5 - 8.1 g/dL   Albumin 2.5 (L) 3.5 - 5.0 g/dL   AST 97 (H) 15 - 41 U/L   ALT 65 (H) 0 - 44 U/L   Alkaline Phosphatase 53 38 - 126 U/L   Total Bilirubin 0.8 0.3 - 1.2 mg/dL   GFR, Estimated 55 (L) >60 mL/min    Comment: (NOTE) Calculated using the CKD-EPI Creatinine Equation (2021)    Anion gap 11  5 - 15    Comment: Performed at [facility], [place] [address], [place], [place] [address]  Magnesium     Status: None   Collection Time: 04/22/22  6:05 AM  Result Value Ref Range   Magnesium 1.8 1.7 - 2.4 mg/dL    Comment: Performed at [facility], [place] [address], [place], [place] [address]  Phosphorus     Status: Abnormal   Collection Time: 04/22/22  6:05 AM  Result Value Ref Range   Phosphorus 2.4 (L) 2.5 - 4.6 mg/dL    Comment: Performed at [facility], [place] [address] [place], [place] [address]  D-dimer, quantitative     Status: Abnormal   Collection Time: 04/22/22  6:05 AM  Result Value Ref Range    D-Dimer, Quant 3.71 (H) 0.00 - 0.50 ug/mL-FEU    Comment: (NOTE) At the manufacturer cut-off value of 0.5 g/mL FEU, this assay has a negative predictive value of 95-100%.This assay is intended for use in conjunction with a clinical pretest probability (PTP) assessment model to exclude pulmonary embolism (PE) and deep venous thrombosis (DVT) in outpatients suspected of PE or DVT. Results should be correlated with clinical presentation. Performed at [facility], [place] [address], [place], [place] [address]   Sedimentation rate     Status: Abnormal   Collection Time: 04/22/22  6:05 AM  Result Value Ref Range   Sed Rate 66 (H) 0 - 16 mm/hr    Comment: Performed at [facility], [place] [address], [place], [place] [address]  Ferritin     Status: None   Collection Time: 04/22/22  6:05 AM  Result Value Ref Range   Ferritin 280 24 - 336 ng/mL    Comment: Performed at [facility], [place] [address], [place], [place] [address]  Lactate dehydrogenase     Status: None   Collection Time: 04/22/22  6:05 AM  Result Value Ref Range   LDH 181 98 - 192 U/L    Comment: Performed at [facility], [place] [address], [place], [place] [address]  C-reactive protein     Status: Abnormal   Collection Time: 04/22/22  6:05 AM  Result Value Ref Range   CRP 17.1 (H) <1.0 mg/dL    Comment: Performed at [facility] [place] [address] [place], [place] [address]  Procalcitonin     Status: None   Collection Time: 04/22/22  6:05 AM  Result Value Ref Range   Procalcitonin 5.77 ng/mL    Comment:        Interpretation: PCT > 2 ng/mL: Systemic infection (sepsis) is likely, unless other causes are known. (NOTE)       Sepsis PCT Algorithm           Lower Respiratory Tract                                      Infection PCT Algorithm    ----------------------------     ----------------------------         PCT < 0.25 ng/mL                PCT <  0.10 ng/mL          Strongly encourage             Strongly discourage   discontinuation of antibiotics    initiation  of antibiotics    ----------------------------     -----------------------------       PCT 0.25 - 0.50 ng/mL            PCT 0.10 - 0.25 ng/mL               OR       >80% decrease in PCT            Discourage initiation of                                            antibiotics      Encourage discontinuation           of antibiotics    ----------------------------     -----------------------------         PCT >= 0.50 ng/mL              PCT 0.26 - 0.50 ng/mL               AND       <80% decrease in PCT              Encourage initiation of                                             antibiotics       Encourage continuation           of antibiotics    ----------------------------     -----------------------------        PCT >= 0.50 ng/mL                  PCT > 0.50 ng/mL               AND         increase in PCT                  Strongly encourage                                      initiation of antibiotics    Strongly encourage escalation           of antibiotics                                     -----------------------------                                           PCT <= 0.25 ng/mL                                                 OR                                        >  80% decrease in PCT                                      Discontinue / Do not initiate                                             antibiotics  Performed at [place] [address], [place], [place] [address]   CK total and CKMB (cardiac)not at [facility]     Status: Abnormal   Collection Time: 04/22/22  6:05 AM  Result Value Ref Range   Total CK 639 (H) 49 - 397 U/L   CK, MB 4.2 0.5 - 5.0 ng/mL   Relative Index 0.7 0.0 - 2.5    Comment: Performed at [place] [address], [place], [place] [address]  Glucose, capillary     Status: Abnormal   Collection  Time: 04/22/22  8:53 AM  Result Value Ref Range   Glucose-Capillary 128 (H) 70 - 99 mg/dL    Comment: Glucose reference range applies only to samples taken after fasting for at least 8 hours.  Culture, blood (Routine X 2) w Reflex to ID Panel     Status: None (Preliminary result)   Collection Time: 04/22/22 12:19 PM   Specimen: BLOOD  Result Value Ref Range   Specimen Description      BLOOD BLOOD RIGHT HAND Performed at [facility] [address], [place], [place] [address]    Special Requests      BOTTLES DRAWN AEROBIC AND ANAEROBIC Blood Culture adequate volume Performed at [facility] [address] [place], [place] [address]    Culture      NO GROWTH < 24 HOURS Performed at [facility] [address], [place], [place] [address]    Report Status PENDING   Culture, blood (Routine X 2) w Reflex to ID Panel     Status: None (Preliminary result)   Collection Time: 04/22/22 12:39 PM   Specimen: BLOOD  Result Value Ref Range   Specimen Description      BLOOD BLOOD LEFT ARM Performed at [facility] [address], [place], [place] [address]    Special Requests      BOTTLES DRAWN AEROBIC AND ANAEROBIC Blood Culture adequate volume Performed at [facility] [address], [place], [place] [address]    Culture      NO GROWTH < 24 HOURS Performed at [facility] [address] [place],  [address]    Report Status PENDING   Glucose, capillary     Status: Abnormal   Collection Time: 04/22/22 12:54 PM  Result Value Ref Range   Glucose-Capillary 143 (H) 70 - 99 mg/dL    Comment: Glucose reference range applies only to samples taken after fasting for at least 8 hours.  Glucose, capillary     Status: None   Collection Time: 04/22/22  4:33 PM  Result Value Ref Range   Glucose-Capillary 94 70 - 99 mg/dL    Comment: Glucose reference range applies only to samples taken after  fasting for at least 8 hours.  Glucose, capillary     Status: Abnormal   Collection Time: 04/22/22  8:00 PM  Result  Value Ref Range   Glucose-Capillary 145 (H) 70 - 99 mg/dL    Comment: Glucose reference range applies only to samples taken after fasting for at least 8 hours.  Glucose, capillary     Status: Abnormal   Collection Time: 04/22/22 11:41 PM  Result Value Ref Range   Glucose-Capillary 159 (H) 70 - 99 mg/dL    Comment: Glucose reference range applies only to samples taken after fasting for at least 8 hours.  Glucose, capillary     Status: Abnormal   Collection Time: 04/23/22  3:33 AM  Result Value Ref Range   Glucose-Capillary 160 (H) 70 - 99 mg/dL    Comment: Glucose reference range applies only to samples taken after fasting for at least 8 hours.  CBC with Differential/Platelet     Status: Abnormal   Collection Time: 04/23/22  6:24 AM  Result Value Ref Range   WBC 11.8 (H) 4.0 - 10.5 K/uL   RBC 3.81 (L) 4.22 - 5.81 MIL/uL   Hemoglobin 11.1 (L) 13.0 - 17.0 g/dL   HCT 33.4 (L) 39.0 - 52.0 %   MCV 87.7 80.0 - 100.0 fL   MCH 29.1 26.0 - 34.0 pg   MCHC 33.2 30.0 - 36.0 g/dL   RDW 15.1 11.5 - 15.5 %   Platelets 209 150 - 400 K/uL   nRBC 0.0 0.0 - 0.2 %   Neutrophils Relative % 85 %   Neutro Abs 10.0 (H) 1.7 - 7.7 K/uL   Lymphocytes Relative 5 %   Lymphs Abs 0.6 (L) 0.7 - 4.0 K/uL   Monocytes Relative 8 %   Monocytes Absolute 1.0 0.1 - 1.0 K/uL   Eosinophils Relative 1 %   Eosinophils Absolute 0.1 0.0 - 0.5 K/uL   Basophils Relative 0 %   Basophils Absolute 0.0 0.0 - 0.1 K/uL   Immature Granulocytes 1 %   Abs Immature Granulocytes 0.08 (H) 0.00 - 0.07 K/uL    Comment: Performed at [facility], [place] [address] [place], [place] [address]  Comprehensive metabolic panel     Status: Abnormal   Collection Time: 04/23/22  6:24 AM  Result Value Ref Range   Sodium 136 135 - 145 mmol/L   Potassium 3.6 3.5 - 5.1 mmol/L   Chloride 105 98 - 111 mmol/L   CO2  18 (L) 22 - 32 mmol/L   Glucose, Bld 156 (H) 70 - 99 mg/dL    Comment: Glucose reference range applies only to samples taken after fasting for at least 8 hours.   BUN 23 8 - 23 mg/dL   Creatinine, Ser 1.17 0.61 - 1.24 mg/dL   Calcium 8.1 (L) 8.9 - 10.3 mg/dL   Total Protein 6.0 (L) 6.5 - 8.1 g/dL   Albumin 2.6 (L) 3.5 - 5.0 g/dL   AST 97 (H) 15 - 41 U/L   ALT 83 (H) 0 - 44 U/L   Alkaline Phosphatase 62 38 - 126 U/L   Total Bilirubin 0.8 0.3 - 1.2 mg/dL   GFR, Estimated >60 >60 mL/min    Comment: (NOTE) Calculated using the CKD-EPI Creatinine Equation (2021)    Anion gap 13 5 - 15    Comment: Performed at [facility], [place] [address], [place], [place] [address]  Magnesium     Status: None   Collection Time: 04/23/22  6:24 AM  Result Value Ref Range   Magnesium 1.7 1.7 - 2.4 mg/dL    Comment: Performed at [facility],  [place] [address], [place], [place] [address]  Phosphorus     Status: None   Collection Time: 04/23/22  6:24 AM  Result Value Ref Range   Phosphorus 2.7 2.5 - 4.6 mg/dL    Comment: Performed at [facility], [place] [address], [place], [place] [address]  D-dimer, quantitative     Status: Abnormal   Collection Time: 04/23/22  6:24 AM  Result Value Ref Range   D-Dimer, Quant 4.21 (H) 0.00 - 0.50 ug/mL-FEU    Comment: (NOTE) At the manufacturer cut-off value of 0.5 g/mL FEU, this assay has a negative predictive value of 95-100%.This assay is intended for use in conjunction with a clinical pretest probability (PTP) assessment model to exclude pulmonary embolism (PE) and deep venous thrombosis (DVT) in outpatients suspected of PE or DVT. Results should be correlated with clinical presentation. Performed at [facility], [place] [address] [place], [place] [address]   Sedimentation rate     Status: Abnormal   Collection Time: 04/23/22  6:24 AM  Result Value Ref Range   Sed Rate 68 (H) 0 - 16 mm/hr     Comment: Performed at [facility], [place] [address], [place], [place] [address]  Ferritin     Status: None   Collection Time: 04/23/22  6:24 AM  Result Value Ref Range   Ferritin 277 24 - 336 ng/mL    Comment: Performed at [facility], [place] [address], [place], [place] [address]  Lactate dehydrogenase     Status: None   Collection Time: 04/23/22  6:24 AM  Result Value Ref Range   LDH 192 98 - 192 U/L    Comment: Performed at [facility], [place] [address], [place], [place] [address]  C-reactive protein     Status: Abnormal   Collection Time: 04/23/22  6:24 AM  Result Value Ref Range   CRP 14.5 (H) <1.0 mg/dL    Comment: Performed at [facility] [address], [place], [place] [address]  CK total and CKMB (cardiac)not at [facility]     Status: None   Collection Time: 04/23/22  6:24 AM  Result Value Ref Range   Total CK 318 49 - 397 U/L   CK, MB 2.7 0.5 - 5.0 ng/mL   Relative Index 0.8 0.0 - 2.5    Comment: Performed at [facility] [address], [place], [place] [address]  Procalcitonin - Baseline     Status: None   Collection Time: 04/23/22  6:24 AM  Result Value Ref Range   Procalcitonin 2.93 ng/mL    Comment:        Interpretation: PCT > 2 ng/mL: Systemic infection (sepsis) is likely, unless other causes are known. (NOTE)       Sepsis PCT Algorithm           Lower Respiratory Tract                                      Infection PCT Algorithm    ----------------------------     ----------------------------         PCT < 0.25 ng/mL                PCT < 0.10 ng/mL          Strongly encourage  Strongly discourage   discontinuation of antibiotics    initiation of antibiotics    ----------------------------     -----------------------------       PCT 0.25 - 0.50 ng/mL            PCT 0.10 - 0.25 ng/mL               OR       >80% decrease in PCT            Discourage initiation of                                             antibiotics      Encourage discontinuation           of antibiotics    ----------------------------     -----------------------------         PCT >= 0.50 ng/mL              PCT 0.26 - 0.50 ng/mL               AND       <80% decrease in PCT              Encourage initiation of                                             antibiotics       Encourage continuation           of antibiotics    ----------------------------     -----------------------------        PCT >= 0.50 ng/mL                  PCT > 0.50 ng/mL               AND         increase in PCT                  Strongly encourage                                      initiation of antibiotics    Strongly encourage escalation           of antibiotics                                     -----------------------------                                           PCT <= 0.25 ng/mL                                                 OR                                        >  80% decrease in PCT                                      Discontinue / Do not initiate                                             antibiotics  Performed at [facility] [address], [place], [place] [address]   Glucose, capillary     Status: Abnormal   Collection Time: 04/23/22  8:50 AM  Result Value Ref Range   Glucose-Capillary 235 (H) 70 - 99 mg/dL    Comment: Glucose reference range applies only to samples taken after fasting for at least 8 hours.   Comment 1 Notify RN    Comment 2 Document in Chart    CT ABDOMEN PELVIS W CONTRAST  Result Date: 04/23/2022 CLINICAL DATA:  77 year old male with abdominal pain. Infection. Recent fever and fall. EXAM: CT ABDOMEN AND PELVIS WITH CONTRAST TECHNIQUE: Multidetector CT imaging of the abdomen and pelvis was performed using the standard protocol following bolus administration of intravenous contrast. RADIATION DOSE REDUCTION: This exam was performed according to the  departmental dose-optimization program which includes automated exposure control, adjustment of the mA and/or kV according to patient size and/or use of iterative reconstruction technique. CONTRAST:  180m OMNIPAQUE IOHEXOL 300 MG/ML  SOLN COMPARISON:  Portable chest 04/17/2022. FINDINGS: Lower chest: Small bilateral layering pleural effusions, greater on the right. No pericardial effusion. Heart size appears to remain normal. Superimposed confluent bilateral lower lobe opacity, with some air bronchograms and consolidation more so on the right. This is beyond that typical of atelectasis. Hepatobiliary: Highly abnormal gallbladder. The gallbladder wall appears indistinct, and there is a large mixed density nearly 5 cm calculus or mass within the lumen (coronal image 54). No hyperenhancement at the gallbladder fossa. However, along the medial anterior fossa there is an indistinct and somewhat bilobed appearing 18 mm hypodense liver lesion. See coronal image 54 and series 2, image 23. No gas within the liver. No superimposed bile duct enlargement. Pancreas: Atrophied. Spleen: Negative. Adrenals/Urinary Tract: Adrenal glands remain normal. Moderate bilateral hydronephrosis and hydroureter with extensive bilateral pararenal space edema or inflammatory stranding. Small calculus within the left renal lower pole. Punctate right lower pole stone. Exophytic bilateral renal cysts with simple fluid density appear benign (no follow-up imaging recommended). On delayed phase images there is fairly symmetric renal contrast enhancement. The right ureter is dilated to the ureterovesical junction with no filling defect identified. Left ureter similarly dilated to the UVJ, although there is an 8 mm calculus either at the ureterovesical junction or adjacent to it. See series 2, image 87 and coronal image 68. Furthermore, the bladder is distended with an estimated bladder volume of 1800 mL. Stomach/Bowel: Oral contrast in the  decompressed rectum and sigmoid colon. Mild sigmoid diverticulosis. Oral contrast throughout the more proximal large bowel, with redundant hepatic flexure and cecum located on a lax mesentery in the right upper quadrant. However, no large bowel inflammation identified. Normal contrast containing appendix on series 2, image 40 which tracks laterally and inferiorly. Decompressed terminal ileum and no dilated small bowel. Decompressed stomach and duodenum. No free air. Vascular/Lymphatic: Normal caliber abdominal aorta. Major arterial structures appear to remain patent. Mild-to-moderate Aortoiliac calcified atherosclerosis. Portal  venous system appears to be patent. No lymphadenopathy identified. Reproductive: Dilated prostatic urethra, questionable previous tear (series 2, image 96). Superimposed bladder distension as above. Superimposed heterogeneous prostate hypertrophy. Otherwise negative. Other: Nonspecific trace presacral stranding. No layering pelvic free fluid. Musculoskeletal: Advanced lumbar disc and endplate degeneration. No acute or suspicious osseous lesion. Nonspecific bilateral body wall edema, especially at the dependent flanks. Trace ventral right lower abdominal wall subcutaneous gas, likely an injection site. IMPRESSION: 1. Highly abnormal gallbladder, with a large mixed density 5 cm calculus or mass within the lumen, and indistinct gallbladder wall. Adjacent bilobed 1.8 cm hypodense liver lesion along the medial anterior gallbladder fossa is nonspecific. Cannot exclude Cholecystitis with an adjacent small Liver Abscess. 2. Superimposed moderate to severe bilateral hydronephrosis and hydroureter with a dilated, 1800 mL Bladder. Consider Bladder Outlet Obstruction (note prostate hypertrophy with dilated prostatic urethra) versus Urinary Retention. However, difficult to exclude an 8 mm distal left ureteral calculus at the UVJ (series 2, image 87). Mild underlying nephrolithiasis. 3. Small bilateral  layering pleural effusions, greater on the right, with confluent bilateral lower lobe opacity which could be Pneumonia or severe atelectasis. 4. Body wall edema.  Aortic Atherosclerosis (ICD10-I70.0). Electronically Signed   By: Genevie Ann M.D.   On: 04/23/2022 06:47      Assessment/Plan Klebsiella bacteremia, unclear etiology Gallbladder wall thickening, gallstones, adjacent liver fluid collection  The patient has been seen, examined, chart, labs, vitals, and imaging personally reviewed.  The patient's CT scan shows gallbladder wall thickening vs mass or stone with an adjacent liver fluid collection which could be abscess vs a cyst.  The patient has no symptoms c/w cholecystitis such as abdominal pain, N/V/D/ post-prandial pain, bloating, belching, etc.  I spoke with Dr. Nevada Crane with radiology and he recommended an [place] with a doppler of the liver to evaluate for this being cholecystitis vs a mass/malignancy.  If this does appear to be more mass like, then recommendation for MRI would be indicated, but given patient unable to lay still well right now, would benefit from this as an outpatient if warranted.  Also pending findings, not sure he would need a perc chole drain at this point either given no symptoms.  Can continue abx therapy per ID/primary service at this point.  We will await [place] for further recommendations, but unlikely to need anything surgical this admission.  May continue a diet.  FEN - carb mod diet VTE - Lovenox, ASA ID - Cefadroxil, per ID  Moderate B hydronephrosis and hydroureter with a possible 21m distal left ureteral calculus at UVJ with urinary retention - urology evaluation pending, staph aureus on culture A fib - on cardizem  HTN HLD COVID  I reviewed Consultant ID notes, hospitalist notes, last 24 h vitals and pain scores, last 48 h intake and output, last 24 h labs and trends, and last 24 h imaging results.  KHenreitta Cea PExecutive Woods [facility] LLCSurgery 04/23/2022, 10:24  AM Please see Amion for pager number during day hours 7:00am-4:30pm or 7:00am -11:30am on weekends

## 2022-04-23 NOTE — Progress Notes (Addendum)
[place] for Infectious Disease  Date of Admission:  04/17/2022   Total days of inpatient antibiotics 6  Principal Problem:   Sepsis ([place]) Active Problems:   BPH with urinary obstruction   Essential hypertension   Atrial fibrillation (HCC)   Dyslipidemia   Iron deficiency anemia   Type 2 diabetes mellitus with stable proliferative retinopathy of both eyes, without long-term current use of insulin (HCC)   weakness and sepsis criteria due to COVID-19   Weakness   Pressure injury of skin   Rhabdomyolysis   Sacral decubitus ulcer, stage II (HCC)   Bacteremia due to Klebsiella pneumoniae   Obesity (BMI 30-39.9)          Assessment: 77 year old male with history of A-fib, diabetes mellitus presented to with temp 100.6 from home.  Found to have COVID-19 with negative chest x-[place].  Son brought him in as patient could not get out of bed.  Hospital course complicated by Klebsiella pneumonia bacteremia treated with ceftriaxone.  Infectious disease engaged for antibiotic recommendations.    #Kleb pneumoniae bacteremia 2/2 likely cholelithiasis -UA+ 6-10 squam, moderate leukocytes, 21-50 wbc with Cx+ 70k MSSA colonies. Given squams present would regard MSSA it as containment rather than overflow from another source. -Pt report  abdominal cramping that has been gong on for awhile. -CT AP showed 5cm gall bladder calculous  favored over GB tumor. Noted that " This case was discussed with Surgery PA midway through this morning. She advises the patient is positive for COVID-19, has no right upper quadrant symptoms, is tolerating p.o. normally, currently afebrile, but with highly abnormal urine culture. We discussed these liver and gallbladder findings, which now seem UNLIKELY to be acute cholecystitis and liver abscess given this clinical setting." -Rest of CT findings show bladder outlet obstruction vs urinary retention with difficult to exclude 38m dial left ureteral  calculus -Surgery consulted no current plans for OR -Of note the reason CT was ordered was because pt was reported abdominal cramping.  -U/S showed gallbladder with with shadow sign 2/2 large intraluminal stone or stone, no gallbladder wall thickening.  No focal levier lesion.  Recommendations: - Surgery following -  GB stones could be nidus of infection likely source of bacteriemia. -Of note urine Cx + staph(not kleb pneumo), does not correlate with UTI/ "highly abnormal urine Cx". Please note urine is not likely source as kleb pneum bacteremia as it did not grow in urine Cx(collected prior to starting abx). -Continue cefadroxil, end date pending further evaluation.   #DM -A1c 8.5 on 04/17/22   #Afib -management per primary     Microbiology:   Antibiotics: Ceftriaxone 1/20-     Cultures: Blood 1/19 2/2 kleb penueumo Urine 1/20 urine cx+ MSSA Other SUBJECTIVE: Resting in bed. No new complaints.  Interval: Afebrile overnight  Review of Systems: Review of Systems  All other systems reviewed and are negative.    Scheduled Meds:  vitamin C  500 mg Oral Daily   aspirin EC  81 mg Oral Daily   cefadroxil  1,000 mg Oral BID   cholecalciferol  5,000 Units Oral Daily   diltiazem  240 mg Oral Daily   enoxaparin (LOVENOX) injection  40 mg Subcutaneous Q24H   finasteride  5 mg Oral Daily   insulin aspart  0-15 Units Subcutaneous TID WC   insulin aspart  0-5 Units Subcutaneous QHS   insulin aspart  10 Units Subcutaneous TID WC   insulin glargine-yfgn  22  Units Subcutaneous BID   melatonin  10 mg Oral QHS   multivitamin with minerals  1 tablet Oral Daily   tamsulosin  0.8 mg Oral Daily   zinc sulfate  220 mg Oral Daily   Continuous Infusions: PRN Meds:.acetaminophen, albuterol, guaiFENesin-dextromethorphan Allergies  Allergen Reactions   Amoxicillin Hives and Itching    OBJECTIVE: Vitals:   04/22/22 1257 04/22/22 2004 04/23/22 0348 04/23/22 1149  BP: (!) 151/108  133/88 (!) 149/84 (!) 159/93  Pulse: 94 (!) 50 74 93  Resp: '19 19 16 20  '$ Temp: 98.4 F (36.9 C) 98.6 F (37 C) 99.1 F (37.3 C) 98.6 F (37 C)  TempSrc: Oral  Oral Oral  SpO2: 96% 96% 95% 96%  Weight:      Height:       Body mass index is 32.37 kg/m.  Physical Exam Constitutional:      General: He is not in acute distress.    Appearance: He is normal weight. He is not toxic-appearing.  HENT:     Head: Normocephalic and atraumatic.     Right Ear: External ear normal.     Left Ear: External ear normal.     Nose: No congestion or rhinorrhea.     Mouth/Throat:     Mouth: Mucous membranes are moist.     Pharynx: Oropharynx is clear.  Eyes:     Extraocular Movements: Extraocular movements intact.     Conjunctiva/sclera: Conjunctivae normal.     Pupils: Pupils are equal, round, and reactive to light.  Cardiovascular:     Rate and Rhythm: Normal rate and regular rhythm.     Heart sounds: No murmur heard.    No friction rub. No gallop.  Pulmonary:     Effort: Pulmonary effort is normal.     Breath sounds: Normal breath sounds.  Abdominal:     General: Abdomen is flat. Bowel sounds are normal.     Palpations: Abdomen is soft.  Musculoskeletal:        General: No swelling. Normal range of motion.     Cervical back: Normal range of motion and neck supple.  Skin:    General: Skin is warm and dry.  Neurological:     General: No focal deficit present.     Mental Status: He is oriented to person, place, and time.  Psychiatric:        Mood and Affect: Mood normal.       Lab Results Lab Results  Component Value Date   WBC 11.8 (H) 04/23/2022   HGB 11.1 (L) 04/23/2022   HCT 33.4 (L) 04/23/2022   MCV 87.7 04/23/2022   PLT 209 04/23/2022    Lab Results  Component Value Date   CREATININE 1.17 04/23/2022   BUN 23 04/23/2022   NA 136 04/23/2022   K 3.6 04/23/2022   CL 105 04/23/2022   CO2 18 (L) 04/23/2022    Lab Results  Component Value Date   ALT 83 (H)  04/23/2022   AST 97 (H) 04/23/2022   ALKPHOS 62 04/23/2022   BILITOT 0.8 04/23/2022        Laurice Record, [facility]ious [facility] 04/23/2022, 2:37 PM

## 2022-04-23 NOTE — Progress Notes (Signed)
[facility]                                                                              David Sharp, is a 77 y.o. male, DOB - Mar 14, 1946, JME:[id number] Admit date - 04/17/2022    Outpatient Primary MD for the patient is [name], MD  LOS - 4  days  Chief Complaint  Patient presents with   Weakness       Brief summary   Patient is a 77 year old male with diabetes mellitus type 2, IDA,HLD, HTN, BPH, afib who presented to ED with complaints of weakness and fever.  On the morning of admission, patient's son could not get him out of the bed, had slid down on the floor.  His wife had tested positive for COVID before.  He had a fever 103 F, no cough or congestion. Patient was admitted for further workup.   Assessment & Plan    Principal Problem:   Severe Sepsis ([place]) POA, with Klebsiella pneumonia bacteremia Gallbladder wall thickening, gallstones with adjacent liver fluid collection -Patient met sepsis criteria on admission with fevers, tachycardia, leukocytosis, lactic acidosis, AKI -Blood cultures positive for Klebsiella bacteremia although UA and culture + 70K  staph aureus -ID consulted, transitioned to cefadroxil -CT abdomen pelvis showed large 5 cm calculus or mass in gallbladder, ?  Acute cholecystitis with an adjacent small liver abscess.  Superimposed moderate to severe bilateral hydronephrosis and hydroureter, with a dilated 1800 cc bladder, consider bladder outlet obstruction -General surgery consulted, currently patient has no nausea vomiting or abdominal pain.,  Recommended ultrasound with Doppler of the liver to evaluate if cholecystitis versus mass -Will await further recommendations from surgery pending [place] results  Active Problems:  Moderate-severe bilateral hydronephrosis, hydroureter with possible 8 mm distal left ureteral calculus at UVJ, urinary retention Underlying history of BPH -Foley catheter placed, continue Flomax, added  finasteride -Urology consulted (Dr Milford Cage)  Generalized debility due to COVID-19  -Improving from COVID standpoint, O2 sats 96% on room air.   - CXR on 1/19 with no infiltrates -CRP, procalcitonin trending down.   -Completed Paxlovid x 5 days -Continue PT OT  Acute rhabdomyolysis with acute kidney injury -Likely due to being on the floor. CK increased to 4876.  Creatinine 1.8 on 04/19/2022 -CK improved to 318, creatinine resolved 1.1  - saline lock IVF -Continue to hold Lipitor    Type 2 diabetes mellitus with stable proliferative retinopathy of both eyes, without long-term current use of insulin (HCC) - A1C at 8.5 -Continue to hold metformin while inpatient CBG (last 3)  Recent Labs    04/23/22 0333 04/23/22 0850 04/23/22 1145  GLUCAP 160* 235* 240*   -Increase Semglee to 22 units twice daily, NovoLog 10 units 3 times daily AC, SSI    Atrial fibrillation (HCC) PAF in 11/21 in setting acute illness requiring hospitalization  -Continue Cardizem 240 mg daily -Was followed by cardiology, was taken off of Eliquis due to hematuria and anemia --zio showed no episodes of afib    Essential hypertension -BP currently stable, continue Cardizem, hold ACE   Dyslipidemia -Hold Lipitor while  CK improving   Iron  deficiency anemia Hemoglobin stabl    Sacral decubitus ulcer, stage II (HCC) -POA -Wound care per nursing    Obesity (BMI 30-39.9)  Estimated body mass index is 32.37 kg/m as calculated from the following:   Height as of this encounter: 5' 11.75" (1.822 m).   Weight as of this encounter: 107.5 kg.  Code Status: Full CODE STATUS DVT Prophylaxis:  enoxaparin (LOVENOX) injection 40 mg Start: 04/17/22 2359   Level of Care: Level of care: Telemetry Family Communication: Updated patient Disposition Plan:      Remains inpatient appropriate:   Procedures:  CT abdomen and pelvis  Consultants:   General surgery Infectious disease Urology  Antimicrobials:    Anti-infectives (From admission, onward)    Start     Dose/Rate Route Frequency Ordered Stop   04/18/22 1000  cefTRIAXone (ROCEPHIN) 2 g in sodium chloride 0.9 % 100 mL IVPB        2 g 200 mL/hr over 30 Minutes Intravenous Every 24 hours 04/18/22 0900     04/17/22 2330  nirmatrelvir/ritonavir (PAXLOVID) 3 tablet        3 tablet Oral 2 times daily 04/17/22 2327 04/22/22 0937          Medications  vitamin C  500 mg Oral Daily   aspirin EC  81 mg Oral Daily   cholecalciferol  5,000 Units Oral Daily   diltiazem  180 mg Oral Daily   enoxaparin (LOVENOX) injection  40 mg Subcutaneous Q24H   insulin aspart  0-20 Units Subcutaneous Q4H   insulin aspart  10 Units Subcutaneous TID WC   insulin glargine-yfgn  20 Units Subcutaneous BID   lactulose  30 g Oral TID   melatonin  10 mg Oral QHS   multivitamin with minerals  1 tablet Oral Daily   [START ON 04/23/2022] tamsulosin  0.4 mg Oral Daily   zinc sulfate  220 mg Oral Daily      Subjective:   David Sharp was seen and examined today.  Denies any nausea vomiting or acute abdominal pain.  No fevers.  Still feels tired, has not been sleeping well.  Per patient, has had issues with prostate before and required Foley catheter in the past 'couple of years ago'.  Objective:   Vitals:   04/21/22 1341 04/21/22 2155 04/22/22 0010 04/22/22 0446  BP: 124/71 (!) 153/77 (!) 155/93 (!) 139/90  Pulse: 84 84 100 98  Resp: '16 17 17 17  '$ Temp: 98.6 F (37 C) 98.5 F (36.9 C) 98.8 F (37.1 C) 98 F (36.7 C)  TempSrc: Oral Oral Oral Oral  SpO2: 94% 97% 94% 94%  Weight:      Height:        Intake/Output Summary (Last 24 hours) at 04/22/2022 1148 Last data filed at 04/22/2022 0447 Gross per 24 hour  Intake --  Output 1250 ml  Net -1250 ml     Wt Readings from Last 3 Encounters:  04/17/22 107.5 kg  03/02/20 115.2 kg  01/31/20 113.4 kg   Physical Exam General: Alert and oriented x 3, NAD Cardiovascular: S1 S2 clear, RRR.   Respiratory: Diminished breath sound at the bases, no wheezing Gastrointestinal: Soft, mild diffuse TTP, nondistended, NBS Ext: no pedal edema bilaterally Neuro: no new deficits Psych: Normal affect     Data Reviewed:  I have personally reviewed following labs    CBC Lab Results  Component Value Date   WBC 10.9 (H) 04/22/2022   RBC 3.99 (L) 04/22/2022   HGB  11.7 (L) 04/22/2022   HCT 35.2 (L) 04/22/2022   MCV 88.2 04/22/2022   MCH 29.3 04/22/2022   PLT 160 04/22/2022   MCHC 33.2 04/22/2022   RDW 14.9 04/22/2022   LYMPHSABS 0.6 (L) 04/22/2022   MONOABS 1.0 04/22/2022   EOSABS 0.0 04/22/2022   BASOSABS 0.0 28/36/6294     Last metabolic panel Lab Results  Component Value Date   NA 134 (L) 04/22/2022   K 3.6 04/22/2022   CL 103 04/22/2022   CO2 20 (L) 04/22/2022   BUN 23 04/22/2022   CREATININE 1.35 (H) 04/22/2022   GLUCOSE 155 (H) 04/22/2022   GFRNONAA 55 (L) 04/22/2022   CALCIUM 8.2 (L) 04/22/2022   PHOS 2.4 (L) 04/22/2022   PROT 6.1 (L) 04/22/2022   ALBUMIN 2.5 (L) 04/22/2022   BILITOT 0.8 04/22/2022   ALKPHOS 53 04/22/2022   AST 97 (H) 04/22/2022   ALT 65 (H) 04/22/2022   ANIONGAP 11 04/22/2022    CBG (last 3)  Recent Labs    04/21/22 2329 04/22/22 0443 04/22/22 0853  GLUCAP 93 153* 128*      Coagulation Profile: No results for input(s): "INR", "PROTIME" in the last 168 hours.   Radiology Studies: I have personally reviewed the imaging studies  No results found.     Estill Cotta M.D. [facility] 04/22/2022, 11:48 AM  Available via Epic secure chat 7am-7pm After 7 pm, please refer to night coverage provider listed on amion.

## 2022-04-23 NOTE — Consult Note (Signed)
Urology Consult   Physician requesting consult: Rai  Reason for consult: 1.  Urinary retention 2.  Left distal ureteral stone  History of Present Illness: David Sharp is a 77 y.o. white male with history of BPH and urinary retention.  Previously followed by Dr.Ottelin here at [facility] urology and underwent TURP in October 2017.  Subsequently has been followed by Dr. Brantley Stage for his urologic care.  Underwent repeat TURP in 2022 and recently seen with minimal residual in his office in August 2023.  Over the last several days patient became lethargic and states that he was having some diminished urinary flow but did not really feel like it was too much of a problem.  He presented here with signs of sepsis and has grown out Klebsiella in his blood and staph in his urine.  Patient denied any flank pain nausea vomiting or gross hematuria.  During this hospitalization had a recent CT urogram which shows massive bladder distention with about 1800 cc in bilateral hydroureteronephrosis and some cysts in the kidneys.  There is symmetric distention of the ureters bilaterally down to the ureterovesical junctions and there is a 8 mm calcification near the left ureterovesical junction could represent distal ureteral calculus or UVJ stone.  Patient has had no prior history of stones.  Urethral Foley was placed earlier today with about 1800 cc residual.  Patient feeling much better now.  Again not complaining of any flank pain.  Patient is currently on finasteride 5 mg daily along with tamsulosin 0.8 mg daily. Past Medical History:  Diagnosis Date   Acute urinary retention    Arthritis    BPH (benign prostatic hyperplasia)    Foley catheter in place    GERD (gastroesophageal reflux disease)    Hyperlipidemia    Hypertension    Type 2 diabetes mellitus ([place])    Wears glasses     Past Surgical History:  Procedure Laterality Date   CATARACT EXTRACTION W/ INTRAOCULAR LENS IMPLANT Right 1990's    INGUINAL HERNIA REPAIR Right 2007  approx   RETINAL DETACHMENT SURGERY Bilateral last one , left eye 1997/  right eye 1990's   TRANSURETHRAL RESECTION OF PROSTATE N/A 01/27/2016   Procedure: TRANSURETHRAL RESECTION OF THE PROSTATE (TURP) WITH GYRUS;  Surgeon: Kathie Rhodes, MD;  Location: [place];  Service: Urology;  Laterality: N/A;    Current Hospital Medications:  Home Meds:  No current facility-administered medications on file prior to encounter.   Current Outpatient Medications on File Prior to Encounter  Medication Sig Dispense Refill   acetaminophen (TYLENOL) 500 MG tablet Take 1,000 mg by mouth every 6 (six) hours as needed for moderate pain.     Ascorbic Acid (VITAMIN C) 1000 MG tablet Take 1,000 mg by mouth daily.     aspirin EC 81 MG tablet Take 81 mg by mouth daily.     atorvastatin (LIPITOR) 20 MG tablet Take 1 tablet (20 mg total) by mouth daily. 90 tablet 1   Cholecalciferol (VITAMIN D) 125 MCG (5000 UT) CAPS Take 5,000 Units by mouth daily.     fluconazole (DIFLUCAN) 200 MG tablet Take 200 mg by mouth once a week.     insulin glargine (LANTUS SOLOSTAR) 100 UNIT/ML Solostar Pen Inject 15 Units into the skin 2 (two) times daily. 15 mL 11   ipratropium (ATROVENT) 0.03 % nasal spray Place 2 sprays into both nostrils every 12 (twelve) hours.     lisinopril (ZESTRIL) 40 MG tablet Take 1  tablet by mouth daily.     metFORMIN (GLUCOPHAGE-XR) 500 MG 24 hr tablet Take 1,000 mg by mouth 2 (two) times daily with a meal.     Multiple Vitamin (MULTIVITAMIN) tablet Take 1 tablet by mouth daily.     Omega-3 Fatty Acids (FISH OIL) 1200 MG CAPS Take 1 capsule by mouth in the morning, at noon, and at bedtime.      Semaglutide (RYBELSUS) 3 MG TABS Take 3 mg by mouth daily.     tamsulosin (FLOMAX) 0.4 MG CAPS capsule Take 1 capsule by mouth daily.     zinc gluconate 50 MG tablet Take 50 mg by mouth daily.     diltiazem (CARDIZEM CD) 180 MG 24 hr capsule Take 1 capsule (180 mg  total) by mouth daily. (Patient not taking: Reported on 04/19/2022) 90 capsule 1   Insulin Pen Needle (PEN NEEDLES) 31G X 6 MM MISC Use to inject your insulin twice a day 100 each 1   metFORMIN (GLUCOPHAGE) 1000 MG tablet Take 1 tablet (1,000 mg total) by mouth 2 (two) times daily with a meal. 180 tablet 1     Scheduled Meds:  vitamin C  500 mg Oral Daily   aspirin EC  81 mg Oral Daily   cefadroxil  1,000 mg Oral BID   cholecalciferol  5,000 Units Oral Daily   diltiazem  240 mg Oral Daily   enoxaparin (LOVENOX) injection  40 mg Subcutaneous Q24H   finasteride  5 mg Oral Daily   insulin aspart  0-15 Units Subcutaneous TID WC   insulin aspart  0-5 Units Subcutaneous QHS   insulin aspart  10 Units Subcutaneous TID WC   insulin glargine-yfgn  22 Units Subcutaneous BID   melatonin  10 mg Oral QHS   multivitamin with minerals  1 tablet Oral Daily   tamsulosin  0.8 mg Oral Daily   zinc sulfate  220 mg Oral Daily   Continuous Infusions: PRN Meds:.acetaminophen, albuterol, guaiFENesin-dextromethorphan  Allergies:  Allergies  Allergen Reactions   Amoxicillin Hives and Itching    History reviewed. No pertinent family history.  Social History:  reports that he has never smoked. He has never used smokeless tobacco. He reports current alcohol use. He reports that he does not use drugs.  ROS: A complete review of systems was performed.  All systems are negative except for pertinent findings as noted.  Physical Exam:  Vital signs in last 24 hours: Temp:  [98.6 F (37 C)-99.1 F (37.3 C)] 98.6 F (37 C) (01/25 1149) Pulse Rate:  [50-93] 93 (01/25 1149) Resp:  [16-20] 20 (01/25 1149) BP: (133-159)/(84-93) 159/93 (01/25 1149) SpO2:  [95 %-96 %] 96 % (01/25 1149) Constitutional:  Alert and oriented, No acute distress GI: Abdomen is soft, nontender, nondistended, no abdominal masses GU: No CVA tenderness  Neurologic: Grossly intact, no focal deficits Psychiatric: Normal mood and  affect  Laboratory Data:  Recent Labs    04/21/22 0547 04/22/22 0605 04/23/22 0624  WBC 9.2 10.9* 11.8*  HGB 11.7* 11.7* 11.1*  HCT 34.9* 35.2* 33.4*  PLT 145* 160 209    Recent Labs    04/21/22 0547 04/22/22 0605 04/23/22 0624  NA 134* 134* 136  K 4.0 3.6 3.6  CL 104 103 105  GLUCOSE 97 155* 156*  BUN 29* 23 23  CALCIUM 8.2* 8.2* 8.1*  CREATININE 1.37* 1.35* 1.17     Results for orders placed or performed during the hospital encounter of 04/17/22 (from the past 24 hour(s))  Glucose, capillary     Status: None   Collection Time: 04/22/22  4:33 PM  Result Value Ref Range   Glucose-Capillary 94 70 - 99 mg/dL  Glucose, capillary     Status: Abnormal   Collection Time: 04/22/22  8:00 PM  Result Value Ref Range   Glucose-Capillary 145 (H) 70 - 99 mg/dL  Glucose, capillary     Status: Abnormal   Collection Time: 04/22/22 11:41 PM  Result Value Ref Range   Glucose-Capillary 159 (H) 70 - 99 mg/dL  Glucose, capillary     Status: Abnormal   Collection Time: 04/23/22  3:33 AM  Result Value Ref Range   Glucose-Capillary 160 (H) 70 - 99 mg/dL  CBC with Differential/Platelet     Status: Abnormal   Collection Time: 04/23/22  6:24 AM  Result Value Ref Range   WBC 11.8 (H) 4.0 - 10.5 K/uL   RBC 3.81 (L) 4.22 - 5.81 MIL/uL   Hemoglobin 11.1 (L) 13.0 - 17.0 g/dL   HCT 33.4 (L) 39.0 - 52.0 %   MCV 87.7 80.0 - 100.0 fL   MCH 29.1 26.0 - 34.0 pg   MCHC 33.2 30.0 - 36.0 g/dL   RDW 15.1 11.5 - 15.5 %   Platelets 209 150 - 400 K/uL   nRBC 0.0 0.0 - 0.2 %   Neutrophils Relative % 85 %   Neutro Abs 10.0 (H) 1.7 - 7.7 K/uL   Lymphocytes Relative 5 %   Lymphs Abs 0.6 (L) 0.7 - 4.0 K/uL   Monocytes Relative 8 %   Monocytes Absolute 1.0 0.1 - 1.0 K/uL   Eosinophils Relative 1 %   Eosinophils Absolute 0.1 0.0 - 0.5 K/uL   Basophils Relative 0 %   Basophils Absolute 0.0 0.0 - 0.1 K/uL   Immature Granulocytes 1 %   Abs Immature Granulocytes 0.08 (H) 0.00 - 0.07 K/uL   Comprehensive metabolic panel     Status: Abnormal   Collection Time: 04/23/22  6:24 AM  Result Value Ref Range   Sodium 136 135 - 145 mmol/L   Potassium 3.6 3.5 - 5.1 mmol/L   Chloride 105 98 - 111 mmol/L   CO2 18 (L) 22 - 32 mmol/L   Glucose, Bld 156 (H) 70 - 99 mg/dL   BUN 23 8 - 23 mg/dL   Creatinine, Ser 1.17 0.61 - 1.24 mg/dL   Calcium 8.1 (L) 8.9 - 10.3 mg/dL   Total Protein 6.0 (L) 6.5 - 8.1 g/dL   Albumin 2.6 (L) 3.5 - 5.0 g/dL   AST 97 (H) 15 - 41 U/L   ALT 83 (H) 0 - 44 U/L   Alkaline Phosphatase 62 38 - 126 U/L   Total Bilirubin 0.8 0.3 - 1.2 mg/dL   GFR, Estimated >60 >60 mL/min   Anion gap 13 5 - 15  Magnesium     Status: None   Collection Time: 04/23/22  6:24 AM  Result Value Ref Range   Magnesium 1.7 1.7 - 2.4 mg/dL  Phosphorus     Status: None   Collection Time: 04/23/22  6:24 AM  Result Value Ref Range   Phosphorus 2.7 2.5 - 4.6 mg/dL  D-dimer, quantitative     Status: Abnormal   Collection Time: 04/23/22  6:24 AM  Result Value Ref Range   D-Dimer, Quant 4.21 (H) 0.00 - 0.50 ug/mL-FEU  Sedimentation rate     Status: Abnormal   Collection Time: 04/23/22  6:24 AM  Result Value Ref Range  Sed Rate 68 (H) 0 - 16 mm/hr  Ferritin     Status: None   Collection Time: 04/23/22  6:24 AM  Result Value Ref Range   Ferritin 277 24 - 336 ng/mL  Lactate dehydrogenase     Status: None   Collection Time: 04/23/22  6:24 AM  Result Value Ref Range   LDH 192 98 - 192 U/L  C-reactive protein     Status: Abnormal   Collection Time: 04/23/22  6:24 AM  Result Value Ref Range   CRP 14.5 (H) <1.0 mg/dL  CK total and CKMB (cardiac)not at [facility]     Status: None   Collection Time: 04/23/22  6:24 AM  Result Value Ref Range   Total CK 318 49 - 397 U/L   CK, MB 2.7 0.5 - 5.0 ng/mL   Relative Index 0.8 0.0 - 2.5  Procalcitonin - Baseline     Status: None   Collection Time: 04/23/22  6:24 AM  Result Value Ref Range   Procalcitonin 2.93 ng/mL  Glucose, capillary     Status:  Abnormal   Collection Time: 04/23/22  8:50 AM  Result Value Ref Range   Glucose-Capillary 235 (H) 70 - 99 mg/dL   Comment 1 Notify RN    Comment 2 Document in Chart   Glucose, capillary     Status: Abnormal   Collection Time: 04/23/22 11:45 AM  Result Value Ref Range   Glucose-Capillary 240 (H) 70 - 99 mg/dL   Comment 1 Notify RN    Comment 2 Document in Chart    Recent Results (from the past 240 hour(s))  Blood culture (routine x 2)     Status: Abnormal   Collection Time: 04/17/22  5:56 PM   Specimen: BLOOD  Result Value Ref Range Status   Specimen Description   Final    BLOOD BLOOD RIGHT WRIST Performed at [facility] [address] [place], [place] [address]    Special Requests   Final    BOTTLES DRAWN AEROBIC AND ANAEROBIC Blood Culture results may not be optimal due to an inadequate volume of blood received in culture bottles Performed at [facility] [address] [place], [place] [address]    Culture  Setup Time   Final    GRAM NEGATIVE RODS AEROBIC BOTTLE ONLY CRITICAL RESULT CALLED TO, READ BACK BY AND VERIFIED WITH: L POINDEXTER,PHARMD'@0458'$  04/19/22 [place] Performed at [facility], [place] [address] [place], [place] [address]    Culture KLEBSIELLA PNEUMONIAE (A)  Final   Report Status 04/21/2022 FINAL  Final   Organism ID, Bacteria KLEBSIELLA PNEUMONIAE  Final      Susceptibility   Klebsiella pneumoniae - MIC*    AMPICILLIN >=32 RESISTANT Resistant     CEFAZOLIN <=4 SENSITIVE Sensitive     CEFEPIME <=0.12 SENSITIVE Sensitive     CEFTAZIDIME <=1 SENSITIVE Sensitive     CEFTRIAXONE <=0.25 SENSITIVE Sensitive     CIPROFLOXACIN <=0.25 SENSITIVE Sensitive     GENTAMICIN <=1 SENSITIVE Sensitive     IMIPENEM <=0.25 SENSITIVE Sensitive     TRIMETH/SULFA <=20 SENSITIVE Sensitive     AMPICILLIN/SULBACTAM 8 SENSITIVE Sensitive     PIP/TAZO <=4 SENSITIVE Sensitive     * KLEBSIELLA PNEUMONIAE  Blood Culture ID Panel (Reflexed)      Status: Abnormal   Collection Time: 04/17/22  5:56 PM  Result Value Ref Range Status   Enterococcus faecalis NOT DETECTED NOT DETECTED Final   Enterococcus Faecium  NOT DETECTED NOT DETECTED Final   Listeria monocytogenes NOT DETECTED NOT DETECTED Final   Staphylococcus species NOT DETECTED NOT DETECTED Final   Staphylococcus aureus (BCID) NOT DETECTED NOT DETECTED Final   Staphylococcus epidermidis NOT DETECTED NOT DETECTED Final   Staphylococcus lugdunensis NOT DETECTED NOT DETECTED Final   Streptococcus species NOT DETECTED NOT DETECTED Final   Streptococcus agalactiae NOT DETECTED NOT DETECTED Final   Streptococcus pneumoniae NOT DETECTED NOT DETECTED Final   Streptococcus pyogenes NOT DETECTED NOT DETECTED Final   A.calcoaceticus-baumannii NOT DETECTED NOT DETECTED Final   Bacteroides fragilis NOT DETECTED NOT DETECTED Final   Enterobacterales DETECTED (A) NOT DETECTED Final    Comment: Enterobacterales represent a large order of gram negative bacteria, not a single organism. CRITICAL RESULT CALLED TO, READ BACK BY AND VERIFIED WITH: L POINDEXTER,PHARMD'@0458'$  04/19/22 [place]    Enterobacter cloacae complex NOT DETECTED NOT DETECTED Final   Escherichia coli NOT DETECTED NOT DETECTED Final   Klebsiella aerogenes NOT DETECTED NOT DETECTED Final   Klebsiella oxytoca NOT DETECTED NOT DETECTED Final   Klebsiella pneumoniae DETECTED (A) NOT DETECTED Final    Comment: CRITICAL RESULT CALLED TO, READ BACK BY AND VERIFIED WITH: L POINDEXTER,PHARMD'@0458'$  04/19/22 [place]    Proteus species NOT DETECTED NOT DETECTED Final   Salmonella species NOT DETECTED NOT DETECTED Final   Serratia marcescens NOT DETECTED NOT DETECTED Final   Haemophilus influenzae NOT DETECTED NOT DETECTED Final   Neisseria meningitidis NOT DETECTED NOT DETECTED Final   Pseudomonas aeruginosa NOT DETECTED NOT DETECTED Final   Stenotrophomonas maltophilia NOT DETECTED NOT DETECTED Final   Candida albicans NOT DETECTED NOT  DETECTED Final   Candida auris NOT DETECTED NOT DETECTED Final   Candida glabrata NOT DETECTED NOT DETECTED Final   Candida krusei NOT DETECTED NOT DETECTED Final   Candida parapsilosis NOT DETECTED NOT DETECTED Final   Candida tropicalis NOT DETECTED NOT DETECTED Final   Cryptococcus neoformans/gattii NOT DETECTED NOT DETECTED Final   CTX-M ESBL NOT DETECTED NOT DETECTED Final   Carbapenem resistance IMP NOT DETECTED NOT DETECTED Final   Carbapenem resistance KPC NOT DETECTED NOT DETECTED Final   Carbapenem resistance NDM NOT DETECTED NOT DETECTED Final   Carbapenem resist OXA 48 LIKE NOT DETECTED NOT DETECTED Final   Carbapenem resistance VIM NOT DETECTED NOT DETECTED Final    Comment: Performed at [facility], [address], [place], [place] [address]  Blood culture (routine x 2)     Status: Abnormal   Collection Time: 04/17/22  6:01 PM   Specimen: BLOOD  Result Value Ref Range Status   Specimen Description   Final    BLOOD BLOOD LEFT FOREARM Performed at [facility] [address] [place], [place] [address]    Special Requests   Final    BOTTLES DRAWN AEROBIC AND ANAEROBIC Blood Culture results may not be optimal due to an inadequate volume of blood received in culture bottles Performed at [facility] [address] [place], [place] [address]    Culture  Setup Time   Final    GRAM NEGATIVE RODS AEROBIC BOTTLE ONLY CRITICAL VALUE NOTED.  VALUE IS CONSISTENT WITH PREVIOUSLY REPORTED AND CALLED VALUE.    Culture (A)  Final    KLEBSIELLA PNEUMONIAE SUSCEPTIBILITIES PERFORMED ON PREVIOUS CULTURE WITHIN THE LAST 5 DAYS. Performed at [facility], [place] [address], [place], [place] [address]    Report Status 04/21/2022 FINAL  Final  Resp panel by RT-PCR (RSV,  Flu A&B, Covid) Anterior Nasal Swab     Status: Abnormal   Collection Time: 04/17/22  6:01 PM   Specimen: Anterior Nasal Swab  Result Value Ref Range Status   SARS  Coronavirus 2 by RT PCR POSITIVE (A) NEGATIVE Final    Comment: (NOTE) SARS-CoV-2 target nucleic acids are DETECTED.  The SARS-CoV-2 RNA is generally detectable in upper respiratory specimens during the acute phase of infection. Positive results are indicative of the presence of the identified virus, but do not rule out bacterial infection or co-infection with other pathogens not detected by the test. Clinical correlation with patient history and other diagnostic information is necessary to determine patient infection status. The expected result is Negative.  Fact Sheet for Patients: EntrepreneurPulse.com.au  Fact Sheet for Healthcare Providers: IncredibleEmployment.be  This test is not yet approved or cleared by the [place] FDA and  has been authorized for detection and/or diagnosis of SARS-CoV-2 by FDA under an Emergency Use Authorization (EUA).  This EUA will remain in effect (meaning this test can be used) for the duration of  the COVID-19 declaration under Section 564(b)(1) of the A ct, 21 U.S.C. section 360bbb-3(b)(1), unless the authorization is terminated or revoked sooner.     Influenza A by PCR NEGATIVE NEGATIVE Final   Influenza B by PCR NEGATIVE NEGATIVE Final    Comment: (NOTE) The Xpert Xpress SARS-CoV-2/FLU/RSV plus assay is intended as an aid in the diagnosis of influenza from Nasopharyngeal swab specimens and should not be used as a sole basis for treatment. Nasal washings and aspirates are unacceptable for Xpert Xpress SARS-CoV-2/FLU/RSV testing.  Fact Sheet for Patients: EntrepreneurPulse.com.au  Fact Sheet for Healthcare Providers: IncredibleEmployment.be  This test is not yet approved or cleared by the [place] FDA and has been authorized for detection and/or diagnosis of SARS-CoV-2 by FDA under an Emergency Use Authorization (EUA). This EUA will remain in effect (meaning  this test can be used) for the duration of the COVID-19 declaration under Section 564(b)(1) of the Act, 21 U.S.C. section 360bbb-3(b)(1), unless the authorization is terminated or revoked.     Resp Syncytial Virus by PCR NEGATIVE NEGATIVE Final    Comment: (NOTE) Fact Sheet for Patients: EntrepreneurPulse.com.au  Fact Sheet for Healthcare Providers: IncredibleEmployment.be  This test is not yet approved or cleared by the [place] FDA and has been authorized for detection and/or diagnosis of SARS-CoV-2 by FDA under an Emergency Use Authorization (EUA). This EUA will remain in effect (meaning this test can be used) for the duration of the COVID-19 declaration under Section 564(b)(1) of the Act, 21 U.S.C. section 360bbb-3(b)(1), unless the authorization is terminated or revoked.  Performed at [facility], [place] [address], [place], [place] [address]   Urine Culture     Status: Abnormal   Collection Time: 04/18/22  3:14 AM   Specimen: Urine, Clean Catch  Result Value Ref Range Status   Specimen Description   Final    URINE, CLEAN CATCH Performed at [facility], [place] [address], [place], [place] [address]    Special Requests   Final    NONE Performed at [facility], [place] [address], [place],  [address]    Culture 70,000 COLONIES/mL STAPHYLOCOCCUS AUREUS (A)  Final   Report Status 04/20/2022 FINAL  Final   Organism ID, Bacteria STAPHYLOCOCCUS AUREUS (A)  Final      Susceptibility   Staphylococcus aureus - MIC*    CIPROFLOXACIN <=0.5 SENSITIVE Sensitive  GENTAMICIN <=0.5 SENSITIVE Sensitive     NITROFURANTOIN <=16 SENSITIVE Sensitive     OXACILLIN <=0.25 SENSITIVE Sensitive     TETRACYCLINE <=1 SENSITIVE Sensitive     VANCOMYCIN 1 SENSITIVE Sensitive     TRIMETH/SULFA <=10 SENSITIVE Sensitive     CLINDAMYCIN <=0.25 SENSITIVE Sensitive     RIFAMPIN <=0.5 SENSITIVE  Sensitive     Inducible Clindamycin NEGATIVE Sensitive     * 70,000 COLONIES/mL STAPHYLOCOCCUS AUREUS  Culture, blood (Routine X 2) w Reflex to ID Panel     Status: None (Preliminary result)   Collection Time: 04/22/22 12:19 PM   Specimen: BLOOD  Result Value Ref Range Status   Specimen Description   Final    BLOOD BLOOD RIGHT HAND Performed at [facility] [address], [place], [place] [address]    Special Requests   Final    BOTTLES DRAWN AEROBIC AND ANAEROBIC Blood Culture adequate volume Performed at [facility] [address] [place], [place] [address]    Culture   Final    NO GROWTH < 24 HOURS Performed at [facility] [address] [place], [place] [address]    Report Status PENDING  Incomplete  Culture, blood (Routine X 2) w Reflex to ID Panel     Status: None (Preliminary result)   Collection Time: 04/22/22 12:39 PM   Specimen: BLOOD  Result Value Ref Range Status   Specimen Description   Final    BLOOD BLOOD LEFT ARM Performed at [facility] [address], [place], [place] [address]    Special Requests   Final    BOTTLES DRAWN AEROBIC AND ANAEROBIC Blood Culture adequate volume Performed at [facility] [address] [place], [place] [address]    Culture   Final    NO GROWTH < 24 HOURS Performed at [facility] [address] [place], [place] [address]    Report Status PENDING  Incomplete    Renal Function: Recent Labs    04/17/22 1903 04/18/22 0320 04/19/22 0757 04/20/22 0627 04/21/22 0547 04/22/22 0605 04/23/22 0624  CREATININE 1.11 1.26* 1.80* 1.48* 1.37* 1.35* 1.17   Estimated Creatinine Clearance: 69.9 mL/min (by C-G formula based on SCr of 1.17 mg/dL).  Radiologic Imaging: US Abdomen Complete  Result Date: 04/23/2022 CLINICAL DATA:  Abdominal pain.  Abnormal gallbladder EXAM: ABDOMEN ULTRASOUND COMPLETE COMPARISON:  Same day CT FINDINGS:  Gallbladder: Abnormal appearance of the gallbladder with wall-echo-shadow sign secondary to large intraluminal stone or stones. Degree of shadowing largely obscures evaluation of the gallbladder. The visualized portion of the gallbladder wall is not thickened. No sonographic Percell Miller sign was reported. Common bile duct: Diameter: 7 mm. Liver: No focal lesion identified. Mildly increased parenchymal echogenicity. Portal vein is patent on color Doppler imaging with normal direction of blood flow towards the liver. IVC: No abnormality visualized. Pancreas: Visualized portion unremarkable. Spleen: Size and appearance within normal limits. Right Kidney: Length: 12.9 cm. Echogenicity within normal limits. 5.4 cm simple cyst at the upper pole of the right kidney. No follow-up imaging with recommended. No solid mass or hydronephrosis visualized. Left Kidney: Length: 12.4 cm. Echogenicity within normal limits. No mass or hydronephrosis visualized. Abdominal aorta: No aneurysm visualized. Other findings: None. IMPRESSION: 1. Abnormal appearance of the gallbladder with wall-echo-shadow sign secondary to large intraluminal stone or stones. Degree of shadowing largely obscures evaluation of the gallbladder. The visualized portion of the gallbladder wall is not thickened. No sonographic  Percell Miller sign was reported. 2. Mildly increased echogenicity of the liver. This is a nonspecific finding but is most commonly seen with fatty infiltration of the liver. There are no obvious focal liver lesions. 3. The common bile duct is upper limits of normal in caliber. Electronically Signed   By: Davina Poke D.O.   On: 04/23/2022 15:34   CT ABDOMEN PELVIS W CONTRAST  Addendum Date: 04/23/2022   ADDENDUM REPORT: 04/23/2022 12:04 ADDENDUM: This case was discussed with Surgery PA midway through this morning. She advises the patient is positive for COVID-19, has no right upper quadrant symptoms, is tolerating p.o. normally, currently afebrile,  but with highly abnormal urine culture. We discussed these liver and gallbladder findings, which now seem UNLIKELY to be acute cholecystitis and liver abscess given this clinical setting. Unusual gallstone is favored over gallbladder tumor. We discussed that further evaluation with Right Upper Quadrant Ultrasound (including Doppler of the gallbladder contents), which will likely be more successful than Abdomen MRI in the inpatient setting. Ultrasound is pending at this time. Electronically Signed   By: Genevie Ann M.D.   On: 04/23/2022 12:04   Result Date: 04/23/2022 CLINICAL DATA:  77 year old male with abdominal pain. Infection. Recent fever and fall. EXAM: CT ABDOMEN AND PELVIS WITH CONTRAST TECHNIQUE: Multidetector CT imaging of the abdomen and pelvis was performed using the standard protocol following bolus administration of intravenous contrast. RADIATION DOSE REDUCTION: This exam was performed according to the departmental dose-optimization program which includes automated exposure control, adjustment of the mA and/or kV according to patient size and/or use of iterative reconstruction technique. CONTRAST:  18m OMNIPAQUE IOHEXOL 300 MG/ML  SOLN COMPARISON:  Portable chest 04/17/2022. FINDINGS: Lower chest: Small bilateral layering pleural effusions, greater on the right. No pericardial effusion. Heart size appears to remain normal. Superimposed confluent bilateral lower lobe opacity, with some air bronchograms and consolidation more so on the right. This is beyond that typical of atelectasis. Hepatobiliary: Highly abnormal gallbladder. The gallbladder wall appears indistinct, and there is a large mixed density nearly 5 cm calculus or mass within the lumen (coronal image 54). No hyperenhancement at the gallbladder fossa. However, along the medial anterior fossa there is an indistinct and somewhat bilobed appearing 18 mm hypodense liver lesion. See coronal image 54 and series 2, image 23. No gas within the  liver. No superimposed bile duct enlargement. Pancreas: Atrophied. Spleen: Negative. Adrenals/Urinary Tract: Adrenal glands remain normal. Moderate bilateral hydronephrosis and hydroureter with extensive bilateral pararenal space edema or inflammatory stranding. Small calculus within the left renal lower pole. Punctate right lower pole stone. Exophytic bilateral renal cysts with simple fluid density appear benign (no follow-up imaging recommended). On delayed phase images there is fairly symmetric renal contrast enhancement. The right ureter is dilated to the ureterovesical junction with no filling defect identified. Left ureter similarly dilated to the UVJ, although there is an 8 mm calculus either at the ureterovesical junction or adjacent to it. See series 2, image 87 and coronal image 68. Furthermore, the bladder is distended with an estimated bladder volume of 1800 mL. Stomach/Bowel: Oral contrast in the decompressed rectum and sigmoid colon. Mild sigmoid diverticulosis. Oral contrast throughout the more proximal large bowel, with redundant hepatic flexure and cecum located on a lax mesentery in the right upper quadrant. However, no large bowel inflammation identified. Normal contrast containing appendix on series 2, image 40 which tracks laterally and inferiorly. Decompressed terminal ileum and no dilated small bowel. Decompressed stomach and duodenum. No free air. Vascular/Lymphatic:  Normal caliber abdominal aorta. Major arterial structures appear to remain patent. Mild-to-moderate Aortoiliac calcified atherosclerosis. Portal venous system appears to be patent. No lymphadenopathy identified. Reproductive: Dilated prostatic urethra, questionable previous tear (series 2, image 96). Superimposed bladder distension as above. Superimposed heterogeneous prostate hypertrophy. Otherwise negative. Other: Nonspecific trace presacral stranding. No layering pelvic free fluid. Musculoskeletal: Advanced lumbar disc and  endplate degeneration. No acute or suspicious osseous lesion. Nonspecific bilateral body wall edema, especially at the dependent flanks. Trace ventral right lower abdominal wall subcutaneous gas, likely an injection site. IMPRESSION: 1. Highly abnormal gallbladder, with a large mixed density 5 cm calculus or mass within the lumen, and indistinct gallbladder wall. Adjacent bilobed 1.8 cm hypodense liver lesion along the medial anterior gallbladder fossa is nonspecific. Cannot exclude Cholecystitis with an adjacent small Liver Abscess. 2. Superimposed moderate to severe bilateral hydronephrosis and hydroureter with a dilated, 1800 mL Bladder. Consider Bladder Outlet Obstruction (note prostate hypertrophy with dilated prostatic urethra) versus Urinary Retention. However, difficult to exclude an 8 mm distal left ureteral calculus at the UVJ (series 2, image 87). Mild underlying nephrolithiasis. 3. Small bilateral layering pleural effusions, greater on the right, with confluent bilateral lower lobe opacity which could be Pneumonia or severe atelectasis. 4. Body wall edema.  Aortic Atherosclerosis (ICD10-I70.0). Electronically Signed: By: Genevie Ann M.D. On: 04/23/2022 06:47    I independently reviewed the above imaging studies.  Impression/Recommendation 1.  Acute urinary retention history of BPH post TURP x 2 2.  Left distal ureteral calculus versus UVJ stone  Plan/recommendation.  Spoke to patient about CT findings.  He is he is minimally symptomatic in terms of any flank pain or colic.  Urine culture and blood culture are not the same bacteria suggesting that stone likely not cause of his Klebsiella bacteremia.  Discussed possibility of cystoscopy and insertion of left JJ stent if he is not clinically improving but appears to be improving bychart review with his parameters.  He likely will need further bladder evaluation with urodynamics after significant Foley decompression time to allow bladder to regain its  tone. If clinically stable would follow expectantly with repeat CT over weekend or at first of the week if he still in the hospital. Keep n.p.o. after midnight and monitor fever.  If febrile overnight will likely place stent tomorrow.  Remi Haggard 04/23/2022, 4:24 PM    Remi Haggard, MD   CC:

## 2022-04-24 DIAGNOSIS — R7881 Bacteremia: Secondary | ICD-10-CM | POA: Diagnosis not present

## 2022-04-24 DIAGNOSIS — I4811 Longstanding persistent atrial fibrillation: Secondary | ICD-10-CM | POA: Diagnosis not present

## 2022-04-24 DIAGNOSIS — U071 COVID-19: Secondary | ICD-10-CM | POA: Diagnosis not present

## 2022-04-24 DIAGNOSIS — M6282 Rhabdomyolysis: Secondary | ICD-10-CM | POA: Diagnosis not present

## 2022-04-24 LAB — CBC WITH DIFFERENTIAL/PLATELET
Abs Immature Granulocytes: 0.13 10*3/uL — ABNORMAL HIGH (ref 0.00–0.07)
Basophils Absolute: 0 10*3/uL (ref 0.0–0.1)
Basophils Relative: 0 %
Eosinophils Absolute: 0.4 10*3/uL (ref 0.0–0.5)
Eosinophils Relative: 5 %
HCT: 32.2 % — ABNORMAL LOW (ref 39.0–52.0)
Hemoglobin: 10.7 g/dL — ABNORMAL LOW (ref 13.0–17.0)
Immature Granulocytes: 2 %
Lymphocytes Relative: 11 %
Lymphs Abs: 1 10*3/uL (ref 0.7–4.0)
MCH: 29 pg (ref 26.0–34.0)
MCHC: 33.2 g/dL (ref 30.0–36.0)
MCV: 87.3 fL (ref 80.0–100.0)
Monocytes Absolute: 0.7 10*3/uL (ref 0.1–1.0)
Monocytes Relative: 8 %
Neutro Abs: 6.5 10*3/uL (ref 1.7–7.7)
Neutrophils Relative %: 74 %
Platelets: 249 10*3/uL (ref 150–400)
RBC: 3.69 MIL/uL — ABNORMAL LOW (ref 4.22–5.81)
RDW: 14.8 % (ref 11.5–15.5)
WBC: 8.7 10*3/uL (ref 4.0–10.5)
nRBC: 0 % (ref 0.0–0.2)

## 2022-04-24 LAB — D-DIMER, QUANTITATIVE: D-Dimer, Quant: 3.75 ug/mL-FEU — ABNORMAL HIGH (ref 0.00–0.50)

## 2022-04-24 LAB — COMPREHENSIVE METABOLIC PANEL
ALT: 75 U/L — ABNORMAL HIGH (ref 0–44)
AST: 72 U/L — ABNORMAL HIGH (ref 15–41)
Albumin: 2.3 g/dL — ABNORMAL LOW (ref 3.5–5.0)
Alkaline Phosphatase: 57 U/L (ref 38–126)
Anion gap: 8 (ref 5–15)
BUN: 15 mg/dL (ref 8–23)
CO2: 25 mmol/L (ref 22–32)
Calcium: 8.3 mg/dL — ABNORMAL LOW (ref 8.9–10.3)
Chloride: 107 mmol/L (ref 98–111)
Creatinine, Ser: 0.95 mg/dL (ref 0.61–1.24)
GFR, Estimated: >60 mL/min (ref >60)
Glucose, Bld: 143 mg/dL — ABNORMAL HIGH (ref 70–99)
Potassium: 3.4 mmol/L — ABNORMAL LOW (ref 3.5–5.1)
Sodium: 140 mmol/L (ref 135–145)
Total Bilirubin: 0.4 mg/dL (ref 0.3–1.2)
Total Protein: 5.5 g/dL — ABNORMAL LOW (ref 6.5–8.1)

## 2022-04-24 LAB — GLUCOSE, CAPILLARY
Glucose-Capillary: 139 mg/dL — ABNORMAL HIGH (ref 70–99)
Glucose-Capillary: 148 mg/dL — ABNORMAL HIGH (ref 70–99)
Glucose-Capillary: 170 mg/dL — ABNORMAL HIGH (ref 70–99)
Glucose-Capillary: 187 mg/dL — ABNORMAL HIGH (ref 70–99)
Glucose-Capillary: 193 mg/dL — ABNORMAL HIGH (ref 70–99)

## 2022-04-24 LAB — MAGNESIUM: Magnesium: 1.8 mg/dL (ref 1.7–2.4)

## 2022-04-24 LAB — CK TOTAL AND CKMB (NOT AT ARMC)
CK, MB: 1.8 ng/mL (ref 0.5–5.0)
Relative Index: 1.3 (ref 0.0–2.5)
Total CK: 143 U/L (ref 49–397)

## 2022-04-24 LAB — FERRITIN: Ferritin: 231 ng/mL (ref 24–336)

## 2022-04-24 LAB — C-REACTIVE PROTEIN: CRP: 10.9 mg/dL — ABNORMAL HIGH (ref <1.0)

## 2022-04-24 LAB — LACTATE DEHYDROGENASE: LDH: 151 U/L (ref 98–192)

## 2022-04-24 LAB — PHOSPHORUS: Phosphorus: 3.4 mg/dL (ref 2.5–4.6)

## 2022-04-24 LAB — SEDIMENTATION RATE: Sed Rate: 50 mm/hr — ABNORMAL HIGH (ref 0–16)

## 2022-04-24 MED ORDER — INSULIN GLARGINE-YFGN 100 UNIT/ML ~~LOC~~ SOLN
15.0000 [IU] | Freq: Two times a day (BID) | SUBCUTANEOUS | Status: DC
  Administered 2022-04-24 – 2022-04-25 (×2): 15 [IU] via SUBCUTANEOUS
  Filled 2022-04-24 (×3): qty 0.15

## 2022-04-24 MED ORDER — METOPROLOL TARTRATE 25 MG PO TABS
25.0000 mg | ORAL_TABLET | Freq: Two times a day (BID) | ORAL | Status: DC
  Administered 2022-04-24 – 2022-04-25 (×3): 25 mg via ORAL
  Filled 2022-04-24 (×3): qty 1

## 2022-04-24 NOTE — Progress Notes (Signed)
Subjective: Afebrile overnight. Feeling better Patient had follow-up renal ultrasound yesterday after Foley catheter had been placed and hydronephrosis on both sides has resolved suggesting that either of the left ureteral stone has passed or it was not causing obstruction and hydronephrosis was just secondary to distended bladder.  Objective: Vital signs in last 24 hours: Temp:  [98.2 F (36.8 C)-98.8 F (37.1 C)] 98.2 F (36.8 C) (01/25 2057) Pulse Rate:  [93-107] 104 (01/25 2057) Resp:  [19-20] 19 (01/25 2057) BP: (154-159)/(93-98) 159/93 (01/25 2057) SpO2:  [95 %-96 %] 96 % (01/25 2057)  Intake/Output from previous day: 01/25 0701 - 01/26 0700 In: 472 [P.O.:472] Out: 5751 [Urine:5750; Stool:1] Intake/Output this shift: Total I/O In: -  Out: 1201 [Urine:1200; Stool:1]  Physical Exam:  General: Alert and oriented  Lab Results: Recent Labs    04/21/22 0547 04/22/22 0605 04/23/22 0624  HGB 11.7* 11.7* 11.1*  HCT 34.9* 35.2* 33.4*   BMET Recent Labs    04/22/22 0605 04/23/22 0624  NA 134* 136  K 3.6 3.6  CL 103 105  CO2 20* 18*  GLUCOSE 155* 156*  BUN 23 23  CREATININE 1.35* 1.17  CALCIUM 8.2* 8.1*     Studies/Results: US Abdomen Complete  Result Date: 04/23/2022 CLINICAL DATA:  Abdominal pain.  Abnormal gallbladder EXAM: ABDOMEN ULTRASOUND COMPLETE COMPARISON:  Same day CT FINDINGS: Gallbladder: Abnormal appearance of the gallbladder with wall-echo-shadow sign secondary to large intraluminal stone or stones. Degree of shadowing largely obscures evaluation of the gallbladder. The visualized portion of the gallbladder wall is not thickened. No sonographic Percell Miller sign was reported. Common bile duct: Diameter: 7 mm. Liver: No focal lesion identified. Mildly increased parenchymal echogenicity. Portal vein is patent on color Doppler imaging with normal direction of blood flow towards the liver. IVC: No abnormality visualized. Pancreas: Visualized portion  unremarkable. Spleen: Size and appearance within normal limits. Right Kidney: Length: 12.9 cm. Echogenicity within normal limits. 5.4 cm simple cyst at the upper pole of the right kidney. No follow-up imaging with recommended. No solid mass or hydronephrosis visualized. Left Kidney: Length: 12.4 cm. Echogenicity within normal limits. No mass or hydronephrosis visualized. Abdominal aorta: No aneurysm visualized. Other findings: None. IMPRESSION: 1. Abnormal appearance of the gallbladder with wall-echo-shadow sign secondary to large intraluminal stone or stones. Degree of shadowing largely obscures evaluation of the gallbladder. The visualized portion of the gallbladder wall is not thickened. No sonographic Percell Miller sign was reported. 2. Mildly increased echogenicity of the liver. This is a nonspecific finding but is most commonly seen with fatty infiltration of the liver. There are no obvious focal liver lesions. 3. The common bile duct is upper limits of normal in caliber. Electronically Signed   By: Davina Poke D.O.   On: 04/23/2022 15:34   CT ABDOMEN PELVIS W CONTRAST  Addendum Date: 04/23/2022   ADDENDUM REPORT: 04/23/2022 12:04 ADDENDUM: This case was discussed with Surgery PA midway through this morning. She advises the patient is positive for COVID-19, has no right upper quadrant symptoms, is tolerating p.o. normally, currently afebrile, but with highly abnormal urine culture. We discussed these liver and gallbladder findings, which now seem UNLIKELY to be acute cholecystitis and liver abscess given this clinical setting. Unusual gallstone is favored over gallbladder tumor. We discussed that further evaluation with Right Upper Quadrant Ultrasound (including Doppler of the gallbladder contents), which will likely be more successful than Abdomen MRI in the inpatient setting. Ultrasound is pending at this time. Electronically Signed   By: Lemmie Evens  [place] Crane M.D.   On: 04/23/2022 12:04   Result Date:  04/23/2022 CLINICAL DATA:  77 year old male with abdominal pain. Infection. Recent fever and fall. EXAM: CT ABDOMEN AND PELVIS WITH CONTRAST TECHNIQUE: Multidetector CT imaging of the abdomen and pelvis was performed using the standard protocol following bolus administration of intravenous contrast. RADIATION DOSE REDUCTION: This exam was performed according to the departmental dose-optimization program which includes automated exposure control, adjustment of the mA and/or kV according to patient size and/or use of iterative reconstruction technique. CONTRAST:  145m OMNIPAQUE IOHEXOL 300 MG/ML  SOLN COMPARISON:  Portable chest 04/17/2022. FINDINGS: Lower chest: Small bilateral layering pleural effusions, greater on the right. No pericardial effusion. Heart size appears to remain normal. Superimposed confluent bilateral lower lobe opacity, with some air bronchograms and consolidation more so on the right. This is beyond that typical of atelectasis. Hepatobiliary: Highly abnormal gallbladder. The gallbladder wall appears indistinct, and there is a large mixed density nearly 5 cm calculus or mass within the lumen (coronal image 54). No hyperenhancement at the gallbladder fossa. However, along the medial anterior fossa there is an indistinct and somewhat bilobed appearing 18 mm hypodense liver lesion. See coronal image 54 and series 2, image 23. No gas within the liver. No superimposed bile duct enlargement. Pancreas: Atrophied. Spleen: Negative. Adrenals/Urinary Tract: Adrenal glands remain normal. Moderate bilateral hydronephrosis and hydroureter with extensive bilateral pararenal space edema or inflammatory stranding. Small calculus within the left renal lower pole. Punctate right lower pole stone. Exophytic bilateral renal cysts with simple fluid density appear benign (no follow-up imaging recommended). On delayed phase images there is fairly symmetric renal contrast enhancement. The right ureter is dilated to the  ureterovesical junction with no filling defect identified. Left ureter similarly dilated to the UVJ, although there is an 8 mm calculus either at the ureterovesical junction or adjacent to it. See series 2, image 87 and coronal image 68. Furthermore, the bladder is distended with an estimated bladder volume of 1800 mL. Stomach/Bowel: Oral contrast in the decompressed rectum and sigmoid colon. Mild sigmoid diverticulosis. Oral contrast throughout the more proximal large bowel, with redundant hepatic flexure and cecum located on a lax mesentery in the right upper quadrant. However, no large bowel inflammation identified. Normal contrast containing appendix on series 2, image 40 which tracks laterally and inferiorly. Decompressed terminal ileum and no dilated small bowel. Decompressed stomach and duodenum. No free air. Vascular/Lymphatic: Normal caliber abdominal aorta. Major arterial structures appear to remain patent. Mild-to-moderate Aortoiliac calcified atherosclerosis. Portal venous system appears to be patent. No lymphadenopathy identified. Reproductive: Dilated prostatic urethra, questionable previous tear (series 2, image 96). Superimposed bladder distension as above. Superimposed heterogeneous prostate hypertrophy. Otherwise negative. Other: Nonspecific trace presacral stranding. No layering pelvic free fluid. Musculoskeletal: Advanced lumbar disc and endplate degeneration. No acute or suspicious osseous lesion. Nonspecific bilateral body wall edema, especially at the dependent flanks. Trace ventral right lower abdominal wall subcutaneous gas, likely an injection site. IMPRESSION: 1. Highly abnormal gallbladder, with a large mixed density 5 cm calculus or mass within the lumen, and indistinct gallbladder wall. Adjacent bilobed 1.8 cm hypodense liver lesion along the medial anterior gallbladder fossa is nonspecific. Cannot exclude Cholecystitis with an adjacent small Liver Abscess. 2. Superimposed moderate to  severe bilateral hydronephrosis and hydroureter with a dilated, 1800 mL Bladder. Consider Bladder Outlet Obstruction (note prostate hypertrophy with dilated prostatic urethra) versus Urinary Retention. However, difficult to exclude an 8 mm distal left ureteral calculus at the UVJ (series 2, image 87).  Mild underlying nephrolithiasis. 3. Small bilateral layering pleural effusions, greater on the right, with confluent bilateral lower lobe opacity which could be Pneumonia or severe atelectasis. 4. Body wall edema.  Aortic Atherosclerosis (ICD10-I70.0). Electronically Signed: By: Genevie Ann M.D. On: 04/23/2022 06:47    Assessment/Plan: 1.  Acute urinary retention with hydronephrosis now resolved with Foley catheter decompression Plan/recommendation: Continue Foley.  Will likely need outpatient follow-up with his urologist in [place](Dr Coughlin).  As hydronephrosis had resolved on ultrasound do not see the urgency of repeating CT scan but will likely need repeat CT scan perhaps within the week to see if stone has passed.  Will get CT sooner if not clinically improving. Discussed with patient    LOS: 6 days   Remi Haggard 04/24/2022, 5:28 AM

## 2022-04-24 NOTE — Progress Notes (Addendum)
[facility]  Date of Admission:  04/17/2022   Total days of inpatient antibiotics 6  Principal Problem:   Sepsis ([place]) Active Problems:   BPH with urinary obstruction   Essential hypertension   Atrial fibrillation (HCC)   Dyslipidemia   Iron deficiency anemia   Type 2 diabetes mellitus with stable proliferative retinopathy of both eyes, without long-term current use of insulin (HCC)   weakness and sepsis criteria due to COVID-19   Weakness   Pressure injury of skin   Rhabdomyolysis   Sacral decubitus ulcer, stage II (HCC)   Bacteremia due to Klebsiella pneumoniae   Obesity (BMI 30-39.9)          Assessment: 77 year old male with history of A-fib, diabetes mellitus presented to with temp 100.6 from home.  Found to have COVID-19 with negative chest x-[facility] Sharp.  Son brought him in as patient could not get out of bed.  Hospital course complicated by Klebsiella pneumonia bacteremia treated with ceftriaxone.  Infectious disease engaged for antibiotic recommendations.    #Kleb pneumoniae bacteremia 2/2 likely cholelithiasis -UA+ 6-10 squam, moderate leukocytes, 21-50 wbc with Cx+ 70k MSSA colonies. Given squams present would regard MSSA it as containment rather than overflow from another source. -Pt report  abdominal cramping that has been gong on for awhile. -CT AP showed 5cm gall bladder calculous  favored over GB tumor. Noted that " This case was discussed with Surgery PA midway through this morning. She advises the patient is positive for COVID-19, has no right upper quadrant symptoms, is tolerating p.o. normally, currently afebrile, but with highly abnormal urine culture. We discussed these liver and gallbladder findings, which now seem UNLIKELY to be acute cholecystitis and liver abscess given this clinical setting." -Rest of CT findings show bladder outlet obstruction vs urinary retention with difficult to exclude 109m dial left ureteral  calculus -Surgery consulted no current plans for OR -Of note the reason CT was ordered was because pt was reported abdominal cramping.  -U/S showed gallbladder with with shadow sign 2/2 large intraluminal stone or stone, no gallbladder wall thickening.  No focal levier lesion.  Recommendations: -Surgery following, discuss choly outpatient -Urology following, f/u outpatient. Noted hydronephrosis resolved on u/s, CT in a week or so if stone has not passed -Complete 10 days of antibiotics with cefadroxil 1gm bid EOT 12/28 for bacteremia 2/2 suspected calculous cholecystitis( cholecystitis not noted on U/S, doing well today). Gallbladder stone is nidus on infection, agree with surgery f/u outpatient to discuss possible cholecystectomy.   #DM -A1c 8.5 on 04/17/22   #Afib -management per primary    ID will sign off Microbiology:   Antibiotics: Ceftriaxone 1/20-     Cultures: Blood 1/19 2/2 kleb penueumo Urine 1/20 urine cx+ MSSA Other SUBJECTIVE: Resting in bed. No new complaints. Ordering lunch. Interval: Afebrile overnight  Review of Systems: Review of Systems  All other systems reviewed and are negative.    Scheduled Meds:  vitamin C  500 mg Oral Daily   aspirin EC  81 mg Oral Daily   cefadroxil  1,000 mg Oral BID   cholecalciferol  5,000 Units Oral Daily   diltiazem  240 mg Oral Daily   enoxaparin (LOVENOX) injection  40 mg Subcutaneous Q24H   finasteride  5 mg Oral Daily   insulin aspart  0-15 Units Subcutaneous TID WC   insulin aspart  0-5 Units Subcutaneous QHS   insulin aspart  5 Units Subcutaneous TID WC  insulin glargine-yfgn  15 Units Subcutaneous BID   melatonin  10 mg Oral QHS   metoprolol tartrate  25 mg Oral BID   multivitamin with minerals  1 tablet Oral Daily   tamsulosin  0.8 mg Oral Daily   zinc sulfate  220 mg Oral Daily   Continuous Infusions: PRN Meds:.acetaminophen, albuterol, guaiFENesin-dextromethorphan Allergies  Allergen Reactions    Amoxicillin Hives and Itching    OBJECTIVE: Vitals:   04/23/22 1149 04/23/22 1703 04/23/22 2057 04/24/22 0500  BP: (!) 159/93 (!) 154/98 (!) 159/93 (!) 152/94  Pulse: 93 (!) 107 (!) 104 84  Resp: '20 20 19 18  '$ Temp: 98.6 F (37 C) 98.8 F (37.1 C) 98.2 F (36.8 C) 98.3 F (36.8 C)  TempSrc: Oral Oral    SpO2: 96% 95% 96% 96%  Weight:      Height:       Body mass index is 32.37 kg/m.  Physical Exam Constitutional:      General: He is not in acute distress.    Appearance: He is normal weight. He is not toxic-appearing.  HENT:     Head: Normocephalic and atraumatic.     Right Ear: External ear normal.     Left Ear: External ear normal.     Nose: No congestion or rhinorrhea.     Mouth/Throat:     Mouth: Mucous membranes are moist.     Pharynx: Oropharynx is clear.  Eyes:     Extraocular Movements: Extraocular movements intact.     Conjunctiva/sclera: Conjunctivae normal.     Pupils: Pupils are equal, round, and reactive to light.  Cardiovascular:     Rate and Rhythm: Normal rate and regular rhythm.     Heart sounds: No murmur heard.    No friction rub. No gallop.  Pulmonary:     Effort: Pulmonary effort is normal.     Breath sounds: Normal breath sounds.  Abdominal:     General: Abdomen is flat. Bowel sounds are normal.     Palpations: Abdomen is soft.  Musculoskeletal:        General: No swelling. Normal range of motion.     Cervical back: Normal range of motion and neck supple.  Skin:    General: Skin is warm and dry.  Neurological:     General: No focal deficit present.     Mental Status: He is oriented to person, place, and time.  Psychiatric:        Mood and Affect: Mood normal.       Lab Results Lab Results  Component Value Date   WBC 8.7 04/24/2022   HGB 10.7 (L) 04/24/2022   HCT 32.2 (L) 04/24/2022   MCV 87.3 04/24/2022   PLT 249 04/24/2022    Lab Results  Component Value Date   CREATININE 0.95 04/24/2022   BUN 15 04/24/2022   NA 140  04/24/2022   K 3.4 (L) 04/24/2022   CL 107 04/24/2022   CO2 25 04/24/2022    Lab Results  Component Value Date   ALT 75 (H) 04/24/2022   AST 72 (H) 04/24/2022   ALKPHOS 57 04/24/2022   BILITOT 0.4 04/24/2022        Laurice Record, [facility] 04/24/2022, 2:06 PM

## 2022-04-24 NOTE — Progress Notes (Addendum)
Progress Note     Subjective: Ate solid diet yesterday without n/v/abdominal pain. No breakfast yet this am. No abdominal pain. States in the past he has had some post prandial nausea 1-2 times per week that goes away within seconds and is not associated with abdominal pain  Objective: Vital signs in last 24 hours: Temp:  [98.2 F (36.8 C)-98.8 F (37.1 C)] 98.3 F (36.8 C) (01/26 0500) Pulse Rate:  [84-107] 84 (01/26 0500) Resp:  [18-20] 18 (01/26 0500) BP: (152-159)/(93-98) 152/94 (01/26 0500) SpO2:  [95 %-96 %] 96 % (01/26 0500) Last BM Date : 04/23/22  Intake/Output from previous day: 01/25 0701 - 01/26 0700 In: 7 [P.O.:472] Out: 7151 [Urine:7150; Stool:1] Intake/Output this shift: Total I/O In: 0  Out: 525 [Urine:525]  PE: General: pleasant, WD, male who is laying in bed in NAD Lungs: Respiratory effort nonlabored Abd: soft, NT, ND Skin: warm and dry Psych: A&Ox3 with an appropriate affect.    Lab Results:  Recent Labs    04/23/22 0624 04/24/22 0535  WBC 11.8* 8.7  HGB 11.1* 10.7*  HCT 33.4* 32.2*  PLT 209 249   BMET Recent Labs    04/23/22 0624 04/24/22 0535  NA 136 140  K 3.6 3.4*  CL 105 107  CO2 18* 25  GLUCOSE 156* 143*  BUN 23 15  CREATININE 1.17 0.95  CALCIUM 8.1* 8.3*   PT/INR No results for input(s): "LABPROT", "INR" in the last 72 hours. CMP     Component Value Date/Time   NA 140 04/24/2022 0535   K 3.4 (L) 04/24/2022 0535   CL 107 04/24/2022 0535   CO2 25 04/24/2022 0535   GLUCOSE 143 (H) 04/24/2022 0535   BUN 15 04/24/2022 0535   CREATININE 0.95 04/24/2022 0535   CALCIUM 8.3 (L) 04/24/2022 0535   PROT 5.5 (L) 04/24/2022 0535   ALBUMIN 2.3 (L) 04/24/2022 0535   AST 72 (H) 04/24/2022 0535   ALT 75 (H) 04/24/2022 0535   ALKPHOS 57 04/24/2022 0535   BILITOT 0.4 04/24/2022 0535   GFRNONAA >60 04/24/2022 0535   Lipase  No results found for: "LIPASE"     Studies/Results: US Abdomen Complete  Result Date:  04/23/2022 CLINICAL DATA:  Abdominal pain.  Abnormal gallbladder EXAM: ABDOMEN ULTRASOUND COMPLETE COMPARISON:  Same day CT FINDINGS: Gallbladder: Abnormal appearance of the gallbladder with wall-echo-shadow sign secondary to large intraluminal stone or stones. Degree of shadowing largely obscures evaluation of the gallbladder. The visualized portion of the gallbladder wall is not thickened. No sonographic Percell Miller sign was reported. Common bile duct: Diameter: 7 mm. Liver: No focal lesion identified. Mildly increased parenchymal echogenicity. Portal vein is patent on color Doppler imaging with normal direction of blood flow towards the liver. IVC: No abnormality visualized. Pancreas: Visualized portion unremarkable. Spleen: Size and appearance within normal limits. Right Kidney: Length: 12.9 cm. Echogenicity within normal limits. 5.4 cm simple cyst at the upper pole of the right kidney. No follow-up imaging with recommended. No solid mass or hydronephrosis visualized. Left Kidney: Length: 12.4 cm. Echogenicity within normal limits. No mass or hydronephrosis visualized. Abdominal aorta: No aneurysm visualized. Other findings: None. IMPRESSION: 1. Abnormal appearance of the gallbladder with wall-echo-shadow sign secondary to large intraluminal stone or stones. Degree of shadowing largely obscures evaluation of the gallbladder. The visualized portion of the gallbladder wall is not thickened. No sonographic Percell Miller sign was reported. 2. Mildly increased echogenicity of the liver. This is a nonspecific finding but is most commonly seen with  fatty infiltration of the liver. There are no obvious focal liver lesions. 3. The common bile duct is upper limits of normal in caliber. Electronically Signed   By: Davina Poke D.O.   On: 04/23/2022 15:34   CT ABDOMEN PELVIS W CONTRAST  Addendum Date: 04/23/2022   ADDENDUM REPORT: 04/23/2022 12:04 ADDENDUM: This case was discussed with Surgery PA midway through this morning.  She advises the patient is positive for COVID-19, has no right upper quadrant symptoms, is tolerating p.o. normally, currently afebrile, but with highly abnormal urine culture. We discussed these liver and gallbladder findings, which now seem UNLIKELY to be acute cholecystitis and liver abscess given this clinical setting. Unusual gallstone is favored over gallbladder tumor. We discussed that further evaluation with Right Upper Quadrant Ultrasound (including Doppler of the gallbladder contents), which will likely be more successful than Abdomen MRI in the inpatient setting. Ultrasound is pending at this time. Electronically Signed   By: Genevie Ann M.D.   On: 04/23/2022 12:04   Result Date: 04/23/2022 CLINICAL DATA:  77 year old male with abdominal pain. Infection. Recent fever and fall. EXAM: CT ABDOMEN AND PELVIS WITH CONTRAST TECHNIQUE: Multidetector CT imaging of the abdomen and pelvis was performed using the standard protocol following bolus administration of intravenous contrast. RADIATION DOSE REDUCTION: This exam was performed according to the departmental dose-optimization program which includes automated exposure control, adjustment of the mA and/or kV according to patient size and/or use of iterative reconstruction technique. CONTRAST:  129m OMNIPAQUE IOHEXOL 300 MG/ML  SOLN COMPARISON:  Portable chest 04/17/2022. FINDINGS: Lower chest: Small bilateral layering pleural effusions, greater on the right. No pericardial effusion. Heart size appears to remain normal. Superimposed confluent bilateral lower lobe opacity, with some air bronchograms and consolidation more so on the right. This is beyond that typical of atelectasis. Hepatobiliary: Highly abnormal gallbladder. The gallbladder wall appears indistinct, and there is a large mixed density nearly 5 cm calculus or mass within the lumen (coronal image 54). No hyperenhancement at the gallbladder fossa. However, along the medial anterior fossa there is an  indistinct and somewhat bilobed appearing 18 mm hypodense liver lesion. See coronal image 54 and series 2, image 23. No gas within the liver. No superimposed bile duct enlargement. Pancreas: Atrophied. Spleen: Negative. Adrenals/Urinary Tract: Adrenal glands remain normal. Moderate bilateral hydronephrosis and hydroureter with extensive bilateral pararenal space edema or inflammatory stranding. Small calculus within the left renal lower pole. Punctate right lower pole stone. Exophytic bilateral renal cysts with simple fluid density appear benign (no follow-up imaging recommended). On delayed phase images there is fairly symmetric renal contrast enhancement. The right ureter is dilated to the ureterovesical junction with no filling defect identified. Left ureter similarly dilated to the UVJ, although there is an 8 mm calculus either at the ureterovesical junction or adjacent to it. See series 2, image 87 and coronal image 68. Furthermore, the bladder is distended with an estimated bladder volume of 1800 mL. Stomach/Bowel: Oral contrast in the decompressed rectum and sigmoid colon. Mild sigmoid diverticulosis. Oral contrast throughout the more proximal large bowel, with redundant hepatic flexure and cecum located on a lax mesentery in the right upper quadrant. However, no large bowel inflammation identified. Normal contrast containing appendix on series 2, image 40 which tracks laterally and inferiorly. Decompressed terminal ileum and no dilated small bowel. Decompressed stomach and duodenum. No free air. Vascular/Lymphatic: Normal caliber abdominal aorta. Major arterial structures appear to remain patent. Mild-to-moderate Aortoiliac calcified atherosclerosis. Portal venous system appears to be patent.  No lymphadenopathy identified. Reproductive: Dilated prostatic urethra, questionable previous tear (series 2, image 96). Superimposed bladder distension as above. Superimposed heterogeneous prostate hypertrophy.  Otherwise negative. Other: Nonspecific trace presacral stranding. No layering pelvic free fluid. Musculoskeletal: Advanced lumbar disc and endplate degeneration. No acute or suspicious osseous lesion. Nonspecific bilateral body wall edema, especially at the dependent flanks. Trace ventral right lower abdominal wall subcutaneous gas, likely an injection site. IMPRESSION: 1. Highly abnormal gallbladder, with a large mixed density 5 cm calculus or mass within the lumen, and indistinct gallbladder wall. Adjacent bilobed 1.8 cm hypodense liver lesion along the medial anterior gallbladder fossa is nonspecific. Cannot exclude Cholecystitis with an adjacent small Liver Abscess. 2. Superimposed moderate to severe bilateral hydronephrosis and hydroureter with a dilated, 1800 mL Bladder. Consider Bladder Outlet Obstruction (note prostate hypertrophy with dilated prostatic urethra) versus Urinary Retention. However, difficult to exclude an 8 mm distal left ureteral calculus at the UVJ (series 2, image 87). Mild underlying nephrolithiasis. 3. Small bilateral layering pleural effusions, greater on the right, with confluent bilateral lower lobe opacity which could be Pneumonia or severe atelectasis. 4. Body wall edema.  Aortic Atherosclerosis (ICD10-I70.0). Electronically Signed: By: Genevie Ann M.D. On: 04/23/2022 06:47    Anti-infectives: Anti-infectives (From admission, onward)    Start     Dose/Rate Route Frequency Ordered Stop   04/23/22 1000  cefadroxil (DURICEF) capsule 1,000 mg        1,000 mg Oral 2 times daily 04/22/22 1216 05/25/22 0959   04/18/22 1000  cefTRIAXone (ROCEPHIN) 2 g in sodium chloride 0.9 % 100 mL IVPB  Status:  Discontinued        2 g 200 mL/hr over 30 Minutes Intravenous Every 24 hours 04/18/22 0900 04/22/22 1216   04/17/22 2330  nirmatrelvir/ritonavir (PAXLOVID) 3 tablet        3 tablet Oral 2 times daily 04/17/22 2327 04/22/22 1610        Assessment/Plan Klebsiella bacteremia, unclear  etiology Cholelithiasis   - CT scan with GB wall thickening and mass vs stone - discussed with radiology and [place] recommended/performed showing intraluminal stone(s) without GB wall thickening and negative sonographic murphy sign. No focal liver lesions - he is not having symptoms consistent with biliary colic or cholecystitis at this time. Upon further discussion he may have had some biliary colic episodes in the past but there is no indication for surgical intervention this hospitalization. We discussed signs/symptoms of cholecystitis and worsening biliary colic. At this time recommend outpatient follow up in our clinic to discuss cholecystectomy once acute issues resolve. I will schedule. - continue abx per primary - continue current diet  We will sign off but please do not hesitate to contact us with any questions or concerns or reconsult if needed.   FEN: HH ID: [email or web address] VTE: [email or web address]  I reviewed Consultant ID notes, hospitalist notes, last 24 h vitals and pain scores, last 48 h intake and output, last 24 h labs and trends, and last 24 h imaging results.    LOS: 6 days   [facility] 04/24/2022, 10:16 AM Please see Amion for pager number during day hours 7:00am-4:30pm

## 2022-04-24 NOTE — Progress Notes (Signed)
[facility]                                                                              David Sharp, is a 77 y.o. male, DOB - 1945/11/25, KVQ:[id number] Admit date - 04/17/2022    Outpatient Primary MD for the patient is David Stains, MD  LOS - 4  days  Chief Complaint  Patient presents with   Weakness       Brief summary   Patient is a 77 year old male with diabetes mellitus type 2, IDA,HLD, HTN, BPH, afib who presented to ED with complaints of weakness and fever.  On the morning of admission, patient's son could not get him out of the bed, had slid down on the floor.  His wife had tested positive for COVID before.  He had a fever 103 F, no cough or congestion. Patient was admitted for further workup.   Assessment & Plan    Principal Problem:   Severe Sepsis ([place]) POA, with Klebsiella pneumonia bacteremia Gallbladder wall thickening, gallstones with adjacent liver fluid collection -Patient met sepsis criteria on admission with fevers, tachycardia, leukocytosis, lactic acidosis, AKI -Blood cultures positive for Klebsiella bacteremia although UA and culture + 70K  staph aureus -ID consulted, transitioned to cefadroxil -CT abdomen pelvis showed large 5 cm calculus or mass in gallbladder, ?  Acute cholecystitis with an adjacent small liver abscess.  Superimposed moderate to severe bilateral hydronephrosis and hydroureter, with a dilated 1800 cc bladder, consider bladder outlet obstruction -Ultrasound abdomen: Large stone without gallbladder wall thickening and negative Murphy sign, no focal liver lesion -General surgery consulted, recommended outpatient follow-up in clinic to discuss cholecystectomy once acute issues are resolved.  Active Problems:  Moderate-severe bilateral hydronephrosis, hydroureter with possible 8 mm distal left ureteral calculus at UVJ, urinary retention Underlying history of BPH -Foley catheter placed, continue Flomax, added  finasteride -Appreciate urology recommendations, recommended to continue Foley catheter and will need outpatient follow-up with his urologist in [facility], Dr. Thomasene Sharp.  As hydronephrosis has resolved on ultrasound, does not need repeat CT emergently however recommended may likely need to repeat CT in a week to see if stone has passed.  Generalized debility due to COVID-19  -Improving from COVID standpoint, O2 sats 96% on room air.   - CXR on 1/19 with no infiltrates -CRP, procalcitonin trending down.   -Completed Paxlovid x 5 days -Will assess PT OT evaluation today  Acute rhabdomyolysis with acute kidney injury -Likely due to being on the floor. CK increased to 4876.  Creatinine 1.8 on 04/19/2022 -CK improved to 318, creatinine resolved 1.1  - saline lock IVF -Continue to hold Lipitor    Type 2 diabetes mellitus with stable proliferative retinopathy of both eyes, without long-term current use of insulin (HCC) - A1C at 8.5 -Continue to hold metformin while inpatient CBG (last 3)  CBG (last 3)  Recent Labs    04/24/22 0632 04/24/22 0757 04/24/22 1143  GLUCAP 139* 148* 170*  -Will resume outpatient Semglee 15 units twice daily, decreased NovoLog to 5 units 3 times daily AC, continue SSI    Atrial fibrillation ([place]) PAF in 11/21 in setting  acute illness requiring hospitalization  -Continue Cardizem 240 mg daily -Was followed by cardiology, was taken off of Eliquis due to hematuria and anemia --zio showed no episodes of afib    Essential hypertension -BP currently stable, continue Cardizem, hold ACE   Dyslipidemia -Hold Lipitor while  CK improving   Iron deficiency anemia Hemoglobin stabl    Sacral decubitus ulcer, stage II (HCC) -POA -Wound care per nursing    Obesity (BMI 30-39.9)  Estimated body mass index is 32.37 kg/m as calculated from the following:   Height as of this encounter: 5' 11.75" (1.822 m).   Weight as of this encounter: 107.5 kg.  Code Status:  Full CODE STATUS DVT Prophylaxis:  enoxaparin (LOVENOX) injection 40 mg Start: 04/17/22 2359   Level of Care: Level of care: Telemetry Family Communication: Updated patient's wife today on the phone. Disposition Plan:      Remains inpatient appropriate: PT OT evaluation, plan to DC home tomorrow   Procedures:  CT abdomen and pelvis  Consultants:   General surgery Infectious disease Urology  Antimicrobials:   Anti-infectives (From admission, onward)    Start     Dose/Rate Route Frequency Ordered Stop   04/18/22 1000  cefTRIAXone (ROCEPHIN) 2 g in sodium chloride 0.9 % 100 mL IVPB        2 g 200 mL/hr over 30 Minutes Intravenous Every 24 hours 04/18/22 0900     04/17/22 2330  nirmatrelvir/ritonavir (PAXLOVID) 3 tablet        3 tablet Oral 2 times daily 04/17/22 2327 04/22/22 0937          Medications  vitamin C  500 mg Oral Daily   aspirin EC  81 mg Oral Daily   cholecalciferol  5,000 Units Oral Daily   diltiazem  180 mg Oral Daily   enoxaparin (LOVENOX) injection  40 mg Subcutaneous Q24H   insulin aspart  0-20 Units Subcutaneous Q4H   insulin aspart  10 Units Subcutaneous TID WC   insulin glargine-yfgn  20 Units Subcutaneous BID   lactulose  30 g Oral TID   melatonin  10 mg Oral QHS   multivitamin with minerals  1 tablet Oral Daily   [START ON 04/23/2022] tamsulosin  0.4 mg Oral Daily   zinc sulfate  220 mg Oral Daily      Subjective:   David Sharp was seen and examined today.  Feeling better today, no acute complaints just deconditioned.  Felt better this morning, slept last night.  No fevers or chills, nausea or vomiting.  Foley catheter +  Objective:   Vitals:   04/21/22 1341 04/21/22 2155 04/22/22 0010 04/22/22 0446  BP: 124/71 (!) 153/77 (!) 155/93 (!) 139/90  Pulse: 84 84 100 98  Resp: '16 17 17 17  '$ Temp: 98.6 F (37 C) 98.5 F (36.9 C) 98.8 F (37.1 C) 98 F (36.7 C)  TempSrc: Oral Oral Oral Oral  SpO2: 94% 97% 94% 94%  Weight:      Height:         Intake/Output Summary (Last 24 hours) at 04/22/2022 1148 Last data filed at 04/22/2022 0447 Gross per 24 hour  Intake --  Output 1250 ml  Net -1250 ml     Wt Readings from Last 3 Encounters:  04/17/22 107.5 kg  03/02/20 115.2 kg  01/31/20 113.4 kg    Physical Exam General: Alert and oriented x 3, NAD Cardiovascular: S1 S2 clear, RRR.  Respiratory: CTAB, no wheezing Gastrointestinal: Soft, nontender, nondistended, NBS Ext:  no pedal edema bilaterally Neuro: no new deficits Skin: No rashes Psych: Normal affect  GU: Foley catheter    Data Reviewed:  I have personally reviewed following labs    CBC Lab Results  Component Value Date   WBC 10.9 (H) 04/22/2022   RBC 3.99 (L) 04/22/2022   HGB 11.7 (L) 04/22/2022   HCT 35.2 (L) 04/22/2022   MCV 88.2 04/22/2022   MCH 29.3 04/22/2022   PLT 160 04/22/2022   MCHC 33.2 04/22/2022   RDW 14.9 04/22/2022   LYMPHSABS 0.6 (L) 04/22/2022   MONOABS 1.0 04/22/2022   EOSABS 0.0 04/22/2022   BASOSABS 0.0 34/19/6222     Last metabolic panel Lab Results  Component Value Date   NA 134 (L) 04/22/2022   K 3.6 04/22/2022   CL 103 04/22/2022   CO2 20 (L) 04/22/2022   BUN 23 04/22/2022   CREATININE 1.35 (H) 04/22/2022   GLUCOSE 155 (H) 04/22/2022   GFRNONAA 55 (L) 04/22/2022   CALCIUM 8.2 (L) 04/22/2022   PHOS 2.4 (L) 04/22/2022   PROT 6.1 (L) 04/22/2022   ALBUMIN 2.5 (L) 04/22/2022   BILITOT 0.8 04/22/2022   ALKPHOS 53 04/22/2022   AST 97 (H) 04/22/2022   ALT 65 (H) 04/22/2022   ANIONGAP 11 04/22/2022    CBG (last 3)  Recent Labs    04/21/22 2329 04/22/22 0443 04/22/22 0853  GLUCAP 93 153* 128*      Coagulation Profile: No results for input(s): "INR", "PROTIME" in the last 168 hours.   Radiology Studies: I have personally reviewed the imaging studies  No results found.     Estill Cotta M.D. [facility] 04/22/2022, 11:48 AM  Available via Epic secure chat 7am-7pm After 7 pm, please refer to  night coverage provider listed on amion.

## 2022-04-25 DIAGNOSIS — I4891 Unspecified atrial fibrillation: Secondary | ICD-10-CM | POA: Diagnosis not present

## 2022-04-25 DIAGNOSIS — N401 Enlarged prostate with lower urinary tract symptoms: Secondary | ICD-10-CM | POA: Diagnosis not present

## 2022-04-25 DIAGNOSIS — U071 COVID-19: Secondary | ICD-10-CM | POA: Diagnosis not present

## 2022-04-25 DIAGNOSIS — M6282 Rhabdomyolysis: Secondary | ICD-10-CM | POA: Diagnosis not present

## 2022-04-25 LAB — CBC WITH DIFFERENTIAL/PLATELET
Abs Immature Granulocytes: 0.23 10*3/uL — ABNORMAL HIGH (ref 0.00–0.07)
Basophils Absolute: 0.1 10*3/uL (ref 0.0–0.1)
Basophils Relative: 1 %
Eosinophils Absolute: 0.4 10*3/uL (ref 0.0–0.5)
Eosinophils Relative: 5 %
HCT: 33.8 % — ABNORMAL LOW (ref 39.0–52.0)
Hemoglobin: 11.3 g/dL — ABNORMAL LOW (ref 13.0–17.0)
Immature Granulocytes: 3 %
Lymphocytes Relative: 15 %
Lymphs Abs: 1.2 10*3/uL (ref 0.7–4.0)
MCH: 29.4 pg (ref 26.0–34.0)
MCHC: 33.4 g/dL (ref 30.0–36.0)
MCV: 87.8 fL (ref 80.0–100.0)
Monocytes Absolute: 0.7 10*3/uL (ref 0.1–1.0)
Monocytes Relative: 9 %
Neutro Abs: 5.7 10*3/uL (ref 1.7–7.7)
Neutrophils Relative %: 67 %
Platelets: 310 10*3/uL (ref 150–400)
RBC: 3.85 MIL/uL — ABNORMAL LOW (ref 4.22–5.81)
RDW: 14.6 % (ref 11.5–15.5)
WBC: 8.3 10*3/uL (ref 4.0–10.5)
nRBC: 0 % (ref 0.0–0.2)

## 2022-04-25 LAB — LACTATE DEHYDROGENASE: LDH: 147 U/L (ref 98–192)

## 2022-04-25 LAB — CK TOTAL AND CKMB (NOT AT ARMC)
CK, MB: 1.7 ng/mL (ref 0.5–5.0)
Relative Index: INVALID (ref 0.0–2.5)
Total CK: 69 U/L (ref 49–397)

## 2022-04-25 LAB — FERRITIN: Ferritin: 169 ng/mL (ref 24–336)

## 2022-04-25 LAB — COMPREHENSIVE METABOLIC PANEL
ALT: 67 U/L — ABNORMAL HIGH (ref 0–44)
AST: 52 U/L — ABNORMAL HIGH (ref 15–41)
Albumin: 2.3 g/dL — ABNORMAL LOW (ref 3.5–5.0)
Alkaline Phosphatase: 55 U/L (ref 38–126)
Anion gap: 8 (ref 5–15)
BUN: 13 mg/dL (ref 8–23)
CO2: 25 mmol/L (ref 22–32)
Calcium: 8 mg/dL — ABNORMAL LOW (ref 8.9–10.3)
Chloride: 102 mmol/L (ref 98–111)
Creatinine, Ser: 1.01 mg/dL (ref 0.61–1.24)
GFR, Estimated: >60 mL/min (ref >60)
Glucose, Bld: 213 mg/dL — ABNORMAL HIGH (ref 70–99)
Potassium: 3.4 mmol/L — ABNORMAL LOW (ref 3.5–5.1)
Sodium: 135 mmol/L (ref 135–145)
Total Bilirubin: 0.4 mg/dL (ref 0.3–1.2)
Total Protein: 5.5 g/dL — ABNORMAL LOW (ref 6.5–8.1)

## 2022-04-25 LAB — D-DIMER, QUANTITATIVE: D-Dimer, Quant: 3.9 ug/mL-FEU — ABNORMAL HIGH (ref 0.00–0.50)

## 2022-04-25 LAB — C-REACTIVE PROTEIN: CRP: 8 mg/dL — ABNORMAL HIGH (ref <1.0)

## 2022-04-25 LAB — GLUCOSE, CAPILLARY
Glucose-Capillary: 172 mg/dL — ABNORMAL HIGH (ref 70–99)
Glucose-Capillary: 206 mg/dL — ABNORMAL HIGH (ref 70–99)
Glucose-Capillary: 264 mg/dL — ABNORMAL HIGH (ref 70–99)
Glucose-Capillary: 130 mg/dL — ABNORMAL HIGH (ref 70–99)
Glucose-Capillary: 173 mg/dL — ABNORMAL HIGH (ref 70–99)
Glucose-Capillary: 220 mg/dL — ABNORMAL HIGH (ref 70–99)

## 2022-04-25 LAB — SEDIMENTATION RATE: Sed Rate: 61 mm/hr — ABNORMAL HIGH (ref 0–16)

## 2022-04-25 LAB — MAGNESIUM: Magnesium: 1.7 mg/dL (ref 1.7–2.4)

## 2022-04-25 LAB — PHOSPHORUS: Phosphorus: 3.4 mg/dL (ref 2.5–4.6)

## 2022-04-25 MED ORDER — AMIODARONE HCL IN DEXTROSE 360-4.14 MG/200ML-% IV SOLN
60.0000 mg/h | INTRAVENOUS | Status: AC
  Administered 2022-04-25: 60 mg/h via INTRAVENOUS
  Filled 2022-04-25: qty 200

## 2022-04-25 MED ORDER — DILTIAZEM HCL 25 MG/5ML IV SOLN
10.0000 mg | Freq: Once | INTRAVENOUS | Status: AC
  Administered 2022-04-25: 10 mg via INTRAVENOUS
  Filled 2022-04-25: qty 5

## 2022-04-25 MED ORDER — CHLORHEXIDINE GLUCONATE CLOTH 2 % EX PADS
6.0000 | MEDICATED_PAD | Freq: Every day | CUTANEOUS | Status: DC
  Administered 2022-04-26 – 2022-04-27 (×2): 6 via TOPICAL

## 2022-04-25 MED ORDER — INSULIN GLARGINE-YFGN 100 UNIT/ML ~~LOC~~ SOLN
18.0000 [IU] | Freq: Two times a day (BID) | SUBCUTANEOUS | Status: DC
  Administered 2022-04-25 – 2022-04-26 (×2): 18 [IU] via SUBCUTANEOUS
  Filled 2022-04-25 (×3): qty 0.18

## 2022-04-25 MED ORDER — AMIODARONE LOAD VIA INFUSION
150.0000 mg | Freq: Once | INTRAVENOUS | Status: AC
  Administered 2022-04-25: 150 mg via INTRAVENOUS
  Filled 2022-04-25: qty 83.34

## 2022-04-25 MED ORDER — POTASSIUM CHLORIDE CRYS ER 20 MEQ PO TBCR
40.0000 meq | EXTENDED_RELEASE_TABLET | Freq: Once | ORAL | Status: AC
  Administered 2022-04-25: 40 meq via ORAL
  Filled 2022-04-25: qty 2

## 2022-04-25 MED ORDER — AMIODARONE HCL IN DEXTROSE 360-4.14 MG/200ML-% IV SOLN
30.0000 mg/h | INTRAVENOUS | Status: DC
  Administered 2022-04-26 (×2): 30 mg/h via INTRAVENOUS
  Administered 2022-04-26: 59.94 mg/h via INTRAVENOUS
  Administered 2022-04-27: 30 mg/h via INTRAVENOUS
  Filled 2022-04-25 (×5): qty 200

## 2022-04-25 NOTE — Progress Notes (Signed)
S: Patient without complaint.  He is hoping to go home.  No flank pain.  No gross hematuria.  O:  Vitals:   04/25/22 1023 04/25/22 1310  BP: 125/73 137/88  Pulse: 65 90  Resp: 18   Temp: 98.2 F (36.8 C) 97.8 F (36.6 C)  SpO2: 94% 93%   He is alert and oriented watching TV Abdomen-soft and nontender GU-Foley catheter in place, urine clear  CBC    Component Value Date/Time   WBC 8.3 04/25/2022 0715   RBC 3.85 (L) 04/25/2022 0715   HGB 11.3 (L) 04/25/2022 0715   HCT 33.8 (L) 04/25/2022 0715   PLT 310 04/25/2022 0715   MCV 87.8 04/25/2022 0715   MCH 29.4 04/25/2022 0715   MCHC 33.4 04/25/2022 0715   RDW 14.6 04/25/2022 0715   LYMPHSABS 1.2 04/25/2022 0715   MONOABS 0.7 04/25/2022 0715   EOSABS 0.4 04/25/2022 0715   BASOSABS 0.1 04/25/2022 0715   Lab Results  Component Value Date   CREATININE 1.01 04/25/2022   CREATININE 0.95 04/24/2022   CREATININE 1.17 04/23/2022   Assessment/Plan: 1.  Acute urinary retention with hydronephrosis now resolved with Foley catheter decompression 2.possible left distal ureteral stone  Plan/recommendation: Continue Foley.  Will likely need outpatient follow-up with his urologist in [facility] (Dr Thomasene Mohair).  As hydronephrosis had resolved on ultrasound do not see the urgency of repeating CT scan but may need repeat CT in future and certainly if he worsens clinically. Discussed with patient and wife.   -Stable from a urologic point of view.  Okay for discharge from a urologic point of view.  Please notify GU with any questions, concerns or changes in patient's status.

## 2022-04-25 NOTE — Progress Notes (Signed)
[facility]                                                                              Dakwon Sooy, is a 77 y.o. male, DOB - October 24, 1945, LNL:[id number] Admit date - 04/17/2022    Outpatient Primary MD for the patient is [name], MD  LOS - 7  days  Chief Complaint  Patient presents with   Weakness       Brief summary   Patient is a 78 year old male with diabetes mellitus type 2, IDA,HLD, HTN, BPH, afib who presented to ED with complaints of weakness and fever.  On the morning of admission, patient's son could not get him out of the bed, had slid down on the floor.  His wife had tested positive for COVID before.  He had a fever 103 F, no cough or congestion. Patient was admitted for further workup.   Assessment & Plan    Principal Problem:   Severe Sepsis ([place]) POA, with Klebsiella pneumonia bacteremia Gallbladder wall thickening, gallstones with adjacent liver fluid collection -Patient met sepsis criteria on admission with fevers, tachycardia, leukocytosis, lactic acidosis, AKI -Blood cultures positive for Klebsiella bacteremia although UA and culture + 70K  staph aureus -CT abdomen pelvis showed large 5 cm calculus or mass in gallbladder, ?  Acute cholecystitis with an adjacent small liver abscess.  Superimposed moderate to severe bilateral hydronephrosis and hydroureter, with a dilated 1800 cc bladder, consider bladder outlet obstruction -US abdomen: Large stone without gallbladder wall thickening and negative Murphy sign, no focal liver lesion -General surgery consulted, recommended outpatient follow-up in clinic to discuss cholecystectomy once acute issues are resolved. -ID consulted, transition to cefadroxil 1 gm BID, stop date 1/28   Active Problems:  Atrial fibrillation with RVR -History of paroxysmal Afib, follows cardiology outpatient, was taken off of Eliquis due to hematuria and anemia. -Heart rate continues to be elevated, noted to be in 130s  today, asymptomatic  - Cardizem was increased to '240mg'$   on 1/25, Lopressor 25 mg twice daily was added on 1/26 with no significant improvement -EKG showed rapid A-fib with RVR, ordered Cardizem 10 mg IV x 1, cardiology consulted   Moderate-severe bilateral hydronephrosis, hydroureter with possible 8 mm distal left ureteral calculus at UVJ, urinary retention Underlying history of BPH -Foley catheter placed, continue Flomax, added finasteride -Appreciate urology recommendations, recommended to continue Foley catheter and will need outpatient follow-up with his urologist in [facility], Dr. Thomasene Mohair.  As hydronephrosis has resolved on ultrasound, does not need repeat CT emergently however recommended may likely need to repeat CT in a week to see if stone has passed.  Generalized debility due to COVID-19  -Improving from COVID standpoint, O2 sats 96% on room air.   - CXR on 1/19 with no infiltrates -CRP, procalcitonin trending down.   -Completed Paxlovid x 5 days  Acute rhabdomyolysis with acute kidney injury -Likely due to being on the floor. CK increased to 4876.  Creatinine 1.8 on 04/19/2022 -CK improved to 318, creatinine improved, 1.0 - saline lock IVF -Continue to hold Lipitor due to transaminitis, rhabdo    Type 2 diabetes mellitus with  stable proliferative retinopathy of both eyes, without long-term current use of insulin (HCC) - A1C at 8.5 -Continue to hold metformin while inpatient CBG (last 3)  Recent Labs    04/24/22 2115 04/25/22 0419 04/25/22 0745  GLUCAP 193* 172* 206*   CBGs elevated, increase Semglee to 18 units BID, continue NovoLog 5 units TID AC, SSI     Essential hypertension -BP currently stable, continue Cardizem, beta-blocker -Holding ACE inhibitor  Dyslipidemia -Hold Lipitor while  CK improving   Iron deficiency anemia H&H stable    Sacral decubitus ulcer, stage II (HCC) -POA -Wound care per nursing    Obesity (BMI 30-39.9)  Estimated body mass  index is 32.37 kg/m as calculated from the following:   Height as of this encounter: 5' 11.75" (1.822 m).   Weight as of this encounter: 107.5 kg.  Code Status: Full CODE STATUS DVT Prophylaxis:  enoxaparin (LOVENOX) injection 40 mg Start: 04/17/22 2359   Level of Care: Level of care: Telemetry Family Communication: Updated patient's wife on the phone on 1/26.  Called patient's wife today but unable to make any contact Disposition Plan:      Remains inpatient appropriate: PT OT evaluation, plan to DC home tomorrow if heart rate controlled   Procedures:  CT abdomen and pelvis  Consultants:   General surgery Infectious disease Urology  Antimicrobials:   Anti-infectives (From admission, onward)    Start     Dose/Rate Route Frequency Ordered Stop   04/23/22 1000  cefadroxil (DURICEF) capsule 1,000 mg        1,000 mg Oral 2 times daily 04/22/22 1216 04/27/22 1108   04/18/22 1000  cefTRIAXone (ROCEPHIN) 2 g in sodium chloride 0.9 % 100 mL IVPB  Status:  Discontinued        2 g 200 mL/hr over 30 Minutes Intravenous Every 24 hours 04/18/22 0900 04/22/22 1216   04/17/22 2330  nirmatrelvir/ritonavir (PAXLOVID) 3 tablet        3 tablet Oral 2 times daily 04/17/22 2327 04/22/22 0937          Medications  vitamin C  500 mg Oral Daily   aspirin EC  81 mg Oral Daily   cefadroxil  1,000 mg Oral BID   cholecalciferol  5,000 Units Oral Daily   diltiazem  240 mg Oral Daily   diltiazem  10 mg Intravenous Once   enoxaparin (LOVENOX) injection  40 mg Subcutaneous Q24H   finasteride  5 mg Oral Daily   insulin aspart  0-15 Units Subcutaneous TID WC   insulin aspart  0-5 Units Subcutaneous QHS   insulin aspart  5 Units Subcutaneous TID WC   insulin glargine-yfgn  15 Units Subcutaneous BID   melatonin  10 mg Oral QHS   metoprolol tartrate  25 mg Oral BID   multivitamin with minerals  1 tablet Oral Daily   potassium chloride  40 mEq Oral Once   tamsulosin  0.8 mg Oral Daily   zinc  sulfate  220 mg Oral Daily      Subjective:   Duayne Brideau was seen and examined today.  Overall improving, noted to be in A-fib with RVR this morning, asymptomatic.  Hoping to go home soon.  No nausea vomiting abdominal pain.  Tolerating diet without any difficulty.  No fevers.  Foley catheter +  Objective:   Vitals:   04/24/22 1443 04/24/22 2119 04/25/22 0423 04/25/22 1023  BP: (!) 148/87 (!) 133/94 (!) 153/104 125/73  Pulse: (!) 101 98 69 65  Resp: '20 20 20 18  '$ Temp: 98.1 F (36.7 C) 98.4 F (36.9 C) 99.2 F (37.3 C) 98.2 F (36.8 C)  TempSrc:  Oral Oral Oral  SpO2: 94% 94% 97% 94%  Weight:      Height:        Intake/Output Summary (Last 24 hours) at 04/25/2022 1120 Last data filed at 04/25/2022 0900 Gross per 24 hour  Intake --  Output 3500 ml  Net -3500 ml     Wt Readings from Last 3 Encounters:  04/17/22 107.5 kg  03/02/20 115.2 kg  01/31/20 113.4 kg   Physical Exam General: Alert and oriented x 3, NAD Cardiovascular: Irregularly irregular, tachycardia Respiratory: CTAB, no wheezing, rales or rhonchi Gastrointestinal: Soft, nontender, nondistended, NBS Ext: no pedal edema bilaterally Neuro: no new deficits Psych: Normal affect GU + foley     Data Reviewed:  I have personally reviewed following labs    CBC Lab Results  Component Value Date   WBC 8.3 04/25/2022   RBC 3.85 (L) 04/25/2022   HGB 11.3 (L) 04/25/2022   HCT 33.8 (L) 04/25/2022   MCV 87.8 04/25/2022   MCH 29.4 04/25/2022   PLT 310 04/25/2022   MCHC 33.4 04/25/2022   RDW 14.6 04/25/2022   LYMPHSABS 1.2 04/25/2022   MONOABS 0.7 04/25/2022   EOSABS 0.4 04/25/2022   BASOSABS 0.1 13/24/4010     Last metabolic panel Lab Results  Component Value Date   NA 135 04/25/2022   K 3.4 (L) 04/25/2022   CL 102 04/25/2022   CO2 25 04/25/2022   BUN 13 04/25/2022   CREATININE 1.01 04/25/2022   GLUCOSE 213 (H) 04/25/2022   GFRNONAA >60 04/25/2022   CALCIUM 8.0 (L) 04/25/2022   PHOS 3.4  04/25/2022   PROT 5.5 (L) 04/25/2022   ALBUMIN 2.3 (L) 04/25/2022   BILITOT 0.4 04/25/2022   ALKPHOS 55 04/25/2022   AST 52 (H) 04/25/2022   ALT 67 (H) 04/25/2022   ANIONGAP 8 04/25/2022    CBG (last 3)  Recent Labs    04/24/22 2115 04/25/22 0419 04/25/22 0745  GLUCAP 193* 172* 206*      Coagulation Profile: No results for input(s): "INR", "PROTIME" in the last 168 hours.   Radiology Studies: I have personally reviewed the imaging studies  US Abdomen Complete  Result Date: 04/23/2022 CLINICAL DATA:  Abdominal pain.  Abnormal gallbladder EXAM: ABDOMEN ULTRASOUND COMPLETE COMPARISON:  Same day CT FINDINGS: Gallbladder: Abnormal appearance of the gallbladder with wall-echo-shadow sign secondary to large intraluminal stone or stones. Degree of shadowing largely obscures evaluation of the gallbladder. The visualized portion of the gallbladder wall is not thickened. No sonographic Percell Miller sign was reported. Common bile duct: Diameter: 7 mm. Liver: No focal lesion identified. Mildly increased parenchymal echogenicity. Portal vein is patent on color Doppler imaging with normal direction of blood flow towards the liver. IVC: No abnormality visualized. Pancreas: Visualized portion unremarkable. Spleen: Size and appearance within normal limits. Right Kidney: Length: 12.9 cm. Echogenicity within normal limits. 5.4 cm simple cyst at the upper pole of the right kidney. No follow-up imaging with recommended. No solid mass or hydronephrosis visualized. Left Kidney: Length: 12.4 cm. Echogenicity within normal limits. No mass or hydronephrosis visualized. Abdominal aorta: No aneurysm visualized. Other findings: None. IMPRESSION: 1. Abnormal appearance of the gallbladder with wall-echo-shadow sign secondary to large intraluminal stone or stones. Degree of shadowing largely obscures evaluation of the gallbladder. The visualized portion of the gallbladder wall is not thickened. No sonographic Murphy sign  was  reported. 2. Mildly increased echogenicity of the liver. This is a nonspecific finding but is most commonly seen with fatty infiltration of the liver. There are no obvious focal liver lesions. 3. The common bile duct is upper limits of normal in caliber. Electronically Signed   By: Davina Poke D.O.   On: 04/23/2022 15:34       Layci Stenglein M.D. [facility] 04/25/2022, 11:20 AM  Available via Epic secure chat 7am-7pm After 7 pm, please refer to night coverage provider listed on amion.

## 2022-04-25 NOTE — Consult Note (Signed)
Cardiology Consultation   Patient ID: [name] MRN: [id number]; DOB: 12/06/1945  Admit date: 04/17/2022 Date of Consult: 04/25/2022  PCP:  [name], MD   [place] Providers Cardiologist:  None   New, f/u [facility]   Patient Profile:   David Sharp is a 77 y.o. male with a hx of DM2, HTN, HLD, GERD, BPH, IDA, PAF not anticoagulated due to hematuria/anemia, who is being seen 04/25/2022 for the evaluation of rapid atrial fibrillation in the setting of COVID and sepsis at the request of Dr. Tana Coast.  History of Present Illness:   Mr. Rosselli was admitted 11/03-11/08/2019 with poor oral intake, nausea and vomiting, and admitted for HHS, new paroxysmal A. fib with RVR and AKI.  He was started on IV insulin and Cardizem drip.  Eventually transitioned to subcu insulin and p.o. Cardizem CD1 180 mg, DC on Eliquis.   He was admitted 1/192024 with weakness and sepsis secondary to COVID-19.  He has Klebsiella pneumonia bacteremia, gallbladder wall thickening and gallstones with adjacent liver fluid collection.  He also had severe bilateral hydronephrosis secondary to urinary retention and now has a Foley in place.  Surgery was consulted and recommended outpatient follow-up.  CK peaked at 4876 but has improved.  Lipitor was held.  He was in sinus rhythm on admission, he went into atrial fibrillation on 1/22 and his heart rate has continued to be elevated.  Cardiology was asked to evaluate him.    Past Medical History:  Diagnosis Date   Acute urinary retention    Arthritis    BPH (benign prostatic hyperplasia)    Foley catheter in place    GERD (gastroesophageal reflux disease)    Hyperlipidemia    Hypertension    Type 2 diabetes mellitus ([place])    Wears glasses     Past Surgical History:  Procedure Laterality Date   CATARACT EXTRACTION W/ INTRAOCULAR LENS IMPLANT Right 1990's   INGUINAL HERNIA REPAIR Right 2007  approx   RETINAL DETACHMENT SURGERY Bilateral last one ,  left eye 1997/  right eye 1990's   TRANSURETHRAL RESECTION OF PROSTATE N/A 01/27/2016   Procedure: TRANSURETHRAL RESECTION OF THE PROSTATE (TURP) WITH GYRUS;  Surgeon: Kathie Rhodes, MD;  Location: [place];  Service: Urology;  Laterality: N/A;     Home Medications:  Prior to Admission medications   Medication Sig Start Date End Date Taking? Authorizing Provider  acetaminophen (TYLENOL) 500 MG tablet Take 1,000 mg by mouth every 6 (six) hours as needed for moderate pain.   Yes [provider]  Ascorbic Acid (VITAMIN C) 1000 MG tablet Take 1,000 mg by mouth daily.   Yes [provider]  aspirin EC 81 MG tablet Take 81 mg by mouth daily.   Yes [provider]  atorvastatin (LIPITOR) 20 MG tablet Take 1 tablet (20 mg total) by mouth daily. 02/03/20 04/18/22 Yes Mercy Riding, MD  Cholecalciferol (VITAMIN D) 125 MCG (5000 UT) CAPS Take 5,000 Units by mouth daily.   Yes [provider]  fluconazole (DIFLUCAN) 200 MG tablet Take 200 mg by mouth once a week. 01/20/22  Yes [provider]  insulin glargine (LANTUS SOLOSTAR) 100 UNIT/ML Solostar Pen Inject 15 Units into the skin 2 (two) times daily. 02/03/20  Yes Mercy Riding, MD  ipratropium (ATROVENT) 0.03 % nasal spray Place 2 sprays into both nostrils every 12 (twelve) hours.   Yes [provider]  lisinopril (ZESTRIL) 40 MG tablet Take 1  tablet by mouth daily. 04/01/20  Yes [provider]  metFORMIN (GLUCOPHAGE-XR) 500 MG 24 hr tablet Take 1,000 mg by mouth 2 (two) times daily with a meal. 03/22/20  Yes [provider]  Multiple Vitamin (MULTIVITAMIN) tablet Take 1 tablet by mouth daily.   Yes [provider]  Omega-3 Fatty Acids (FISH OIL) 1200 MG CAPS Take 1 capsule by mouth in the morning, at noon, and at bedtime.    Yes [provider]  Semaglutide (RYBELSUS) 3 MG TABS Take 3 mg by mouth daily. 04/09/22  Yes [provider]  tamsulosin  (FLOMAX) 0.4 MG CAPS capsule Take 1 capsule by mouth daily. 02/03/22  Yes [provider]  zinc gluconate 50 MG tablet Take 50 mg by mouth daily.   Yes [provider]  diltiazem (CARDIZEM CD) 180 MG 24 hr capsule Take 1 capsule (180 mg total) by mouth daily. Patient not taking: Reported on 04/19/2022 02/03/20   Mercy Riding, MD  Insulin Pen Needle (PEN NEEDLES) 31G X 6 MM MISC Use to inject your insulin twice a day 02/03/20   Mercy Riding, MD  metFORMIN (GLUCOPHAGE) 1000 MG tablet Take 1 tablet (1,000 mg total) by mouth 2 (two) times daily with a meal. 02/03/20 04/18/22  Mercy Riding, MD    Inpatient Medications: Scheduled Meds:  vitamin C  500 mg Oral Daily   aspirin EC  81 mg Oral Daily   cefadroxil  1,000 mg Oral BID   cholecalciferol  5,000 Units Oral Daily   diltiazem  240 mg Oral Daily   enoxaparin (LOVENOX) injection  40 mg Subcutaneous Q24H   finasteride  5 mg Oral Daily   insulin aspart  0-15 Units Subcutaneous TID WC   insulin aspart  0-5 Units Subcutaneous QHS   insulin aspart  5 Units Subcutaneous TID WC   insulin glargine-yfgn  18 Units Subcutaneous BID   melatonin  10 mg Oral QHS   metoprolol tartrate  25 mg Oral BID   multivitamin with minerals  1 tablet Oral Daily   tamsulosin  0.8 mg Oral Daily   zinc sulfate  220 mg Oral Daily   Continuous Infusions:  PRN Meds: acetaminophen, albuterol, guaiFENesin-dextromethorphan  Allergies:    Allergies  Allergen Reactions   Amoxicillin Hives and Itching    Social History:   Social History   Socioeconomic History   Marital status: Married    Spouse name: Not on file   Number of children: Not on file   Years of education: Not on file   Highest education level: Not on file  Occupational History   Not on file  Tobacco Use   Smoking status: Never   Smokeless tobacco: Never  Vaping Use   Vaping Use: Never used  Substance and Sexual Activity   Alcohol use: Yes    Comment: VERY RARE   Drug use: No    Sexual activity: Not on file  Other Topics Concern   Not on file  Social History Narrative   Not on file   Social Determinants of Health   Financial Resource Strain: Not on file  Food Insecurity: No Food Insecurity (04/18/2022)   Hunger Vital Sign    Worried About Running Out of Food in the Last Year: Never true    Ran Out of Food in the Last Year: Never true  Transportation Needs: No Transportation Needs (04/18/2022)   PRAPARE - Hydrologist (Medical): No  Lack of Transportation (Non-Medical): No  Physical Activity: Not on file  Stress: Not on file  Social Connections: Not on file  Intimate Partner Violence: Not At Risk (04/18/2022)   Humiliation, Afraid, Rape, and Kick questionnaire    Fear of Current or Ex-Partner: No    Emotionally Abused: No    Physically Abused: No    Sexually Abused: No    Family History:   History reviewed. No pertinent family history.   ROS:  Please see the history of present illness.  All other ROS reviewed and negative.     Physical Exam/Data:   Vitals:   04/24/22 2119 04/25/22 0423 04/25/22 1023 04/25/22 1310  BP: (!) 133/94 (!) 153/104 125/73 137/88  Pulse: 98 69 65 90  Resp: '20 20 18   '$ Temp: 98.4 F (36.9 C) 99.2 F (37.3 C) 98.2 F (36.8 C) 97.8 F (36.6 C)  TempSrc: Oral Oral Oral Oral  SpO2: 94% 97% 94% 93%  Weight:      Height:        Intake/Output Summary (Last 24 hours) at 04/25/2022 1317 Last data filed at 04/25/2022 0900 Gross per 24 hour  Intake --  Output 3500 ml  Net -3500 ml      04/17/2022    5:52 PM 03/02/2020   11:27 AM 01/31/2020    6:15 PM  Last 3 Weights  Weight (lbs) 237 lb 254 lb 250 lb  Weight (kg) 107.502 kg 115.214 kg 113.399 kg     Body mass index is 32.37 kg/m.  General:  Well nourished, well developed, in no acute distress HEENT: normal Neck: no JVD Vascular: No carotid bruits; Distal pulses 2+ bilaterally Cardiac:  irregular, tachy Lungs:  clear to auscultation  bilaterally, no wheezing, rhonchi or rales  Abd: soft, nontender, no hepatomegaly  Ext: no edema Musculoskeletal:  No deformities, BUE and BLE strength normal and equal Skin: warm and dry  Neuro:  CNs 2-12 intact, no focal abnormalities noted Psych:  Normal affect   EKG:  The EKG was personally reviewed and demonstrates: Admission ECG was sinus rhythm, heart rate 94 1/27 ECG is atrial fibrillation, heart rate 121 Telemetry:  Telemetry was personally reviewed and demonstrates:   Initially sinus rhythm, he converted to atrial fibrillation RVR on 1/22  Relevant CV Studies:  ECHO: 02/02/2020  1. Left ventricular ejection fraction, by estimation, is 65 to 70%. The  left ventricle has normal function. The left ventricle has no regional  wall motion abnormalities. There is mild left ventricular hypertrophy.  Left ventricular diastolic parameters  were normal.   2. Right ventricular systolic function is normal. The right ventricular  size is normal.   3. No evidence of mitral valve regurgitation.   4. Mild aortic valve sclerosis is present, with no evidence of aortic  valve stenosis.    Laboratory Data:  High Sensitivity Troponin:   Recent Labs  Lab 04/18/22 0002 04/18/22 1355  TROPONINIHS 49* 122*     Chemistry Recent Labs  Lab 04/23/22 0624 04/24/22 0535 04/25/22 0715  NA 136 140 135  K 3.6 3.4* 3.4*  CL 105 107 102  CO2 18* 25 25  GLUCOSE 156* 143* 213*  BUN '23 15 13  '$ CREATININE 1.17 0.95 1.01  CALCIUM 8.1* 8.3* 8.0*  MG 1.7 1.8 1.7  GFRNONAA >60 >60 >60  ANIONGAP '13 8 8    '$ Recent Labs  Lab 04/23/22 0624 04/24/22 0535 04/25/22 0715  PROT 6.0* 5.5* 5.5*  ALBUMIN 2.6* 2.3* 2.3*  AST  97* 72* 52*  ALT 83* 75* 67*  ALKPHOS 62 57 55  BILITOT 0.8 0.4 0.4   Lipids No results for input(s): "CHOL", "TRIG", "HDL", "LABVLDL", "LDLCALC", "CHOLHDL" in the last 168 hours.  Hematology Recent Labs  Lab 04/23/22 0624 04/24/22 0535 04/25/22 0715  WBC 11.8* 8.7 8.3   RBC 3.81* 3.69* 3.85*  HGB 11.1* 10.7* 11.3*  HCT 33.4* 32.2* 33.8*  MCV 87.7 87.3 87.8  MCH 29.1 29.0 29.4  MCHC 33.2 33.2 33.4  RDW 15.1 14.8 14.6  PLT 209 249 310   Thyroid No results for input(s): "TSH", "FREET4" in the last 168 hours.  BNPNo results for input(s): "BNP", "PROBNP" in the last 168 hours.  DDimer  Recent Labs  Lab 04/23/22 0624 04/24/22 0535 04/25/22 0715  DDIMER 4.21* 3.75* 3.90*     Radiology/Studies:  US Abdomen Complete  Result Date: 04/23/2022 CLINICAL DATA:  Abdominal pain.  Abnormal gallbladder EXAM: ABDOMEN ULTRASOUND COMPLETE COMPARISON:  Same day CT FINDINGS: Gallbladder: Abnormal appearance of the gallbladder with wall-echo-shadow sign secondary to large intraluminal stone or stones. Degree of shadowing largely obscures evaluation of the gallbladder. The visualized portion of the gallbladder wall is not thickened. No sonographic Percell Miller sign was reported. Common bile duct: Diameter: 7 mm. Liver: No focal lesion identified. Mildly increased parenchymal echogenicity. Portal vein is patent on color Doppler imaging with normal direction of blood flow towards the liver. IVC: No abnormality visualized. Pancreas: Visualized portion unremarkable. Spleen: Size and appearance within normal limits. Right Kidney: Length: 12.9 cm. Echogenicity within normal limits. 5.4 cm simple cyst at the upper pole of the right kidney. No follow-up imaging with recommended. No solid mass or hydronephrosis visualized. Left Kidney: Length: 12.4 cm. Echogenicity within normal limits. No mass or hydronephrosis visualized. Abdominal aorta: No aneurysm visualized. Other findings: None. IMPRESSION: 1. Abnormal appearance of the gallbladder with wall-echo-shadow sign secondary to large intraluminal stone or stones. Degree of shadowing largely obscures evaluation of the gallbladder. The visualized portion of the gallbladder wall is not thickened. No sonographic Percell Miller sign was reported. 2. Mildly  increased echogenicity of the liver. This is a nonspecific finding but is most commonly seen with fatty infiltration of the liver. There are no obvious focal liver lesions. 3. The common bile duct is upper limits of normal in caliber. Electronically Signed   By: Davina Poke D.O.   On: 04/23/2022 15:34   CT ABDOMEN PELVIS W CONTRAST  Addendum Date: 04/23/2022   ADDENDUM REPORT: 04/23/2022 12:04 ADDENDUM: This case was discussed with Surgery PA midway through this morning. She advises the patient is positive for COVID-19, has no right upper quadrant symptoms, is tolerating p.o. normally, currently afebrile, but with highly abnormal urine culture. We discussed these liver and gallbladder findings, which now seem UNLIKELY to be acute cholecystitis and liver abscess given this clinical setting. Unusual gallstone is favored over gallbladder tumor. We discussed that further evaluation with Right Upper Quadrant Ultrasound (including Doppler of the gallbladder contents), which will likely be more successful than Abdomen MRI in the inpatient setting. Ultrasound is pending at this time. Electronically Signed   By: Genevie Ann M.D.   On: 04/23/2022 12:04   Result Date: 04/23/2022 CLINICAL DATA:  77 year old male with abdominal pain. Infection. Recent fever and fall. EXAM: CT ABDOMEN AND PELVIS WITH CONTRAST TECHNIQUE: Multidetector CT imaging of the abdomen and pelvis was performed using the standard protocol following bolus administration of intravenous contrast. RADIATION DOSE REDUCTION: This exam was performed according to the departmental  dose-optimization program which includes automated exposure control, adjustment of the mA and/or kV according to patient size and/or use of iterative reconstruction technique. CONTRAST:  170m OMNIPAQUE IOHEXOL 300 MG/ML  SOLN COMPARISON:  Portable chest 04/17/2022. FINDINGS: Lower chest: Small bilateral layering pleural effusions, greater on the right. No pericardial effusion.  Heart size appears to remain normal. Superimposed confluent bilateral lower lobe opacity, with some air bronchograms and consolidation more so on the right. This is beyond that typical of atelectasis. Hepatobiliary: Highly abnormal gallbladder. The gallbladder wall appears indistinct, and there is a large mixed density nearly 5 cm calculus or mass within the lumen (coronal image 54). No hyperenhancement at the gallbladder fossa. However, along the medial anterior fossa there is an indistinct and somewhat bilobed appearing 18 mm hypodense liver lesion. See coronal image 54 and series 2, image 23. No gas within the liver. No superimposed bile duct enlargement. Pancreas: Atrophied. Spleen: Negative. Adrenals/Urinary Tract: Adrenal glands remain normal. Moderate bilateral hydronephrosis and hydroureter with extensive bilateral pararenal space edema or inflammatory stranding. Small calculus within the left renal lower pole. Punctate right lower pole stone. Exophytic bilateral renal cysts with simple fluid density appear benign (no follow-up imaging recommended). On delayed phase images there is fairly symmetric renal contrast enhancement. The right ureter is dilated to the ureterovesical junction with no filling defect identified. Left ureter similarly dilated to the UVJ, although there is an 8 mm calculus either at the ureterovesical junction or adjacent to it. See series 2, image 87 and coronal image 68. Furthermore, the bladder is distended with an estimated bladder volume of 1800 mL. Stomach/Bowel: Oral contrast in the decompressed rectum and sigmoid colon. Mild sigmoid diverticulosis. Oral contrast throughout the more proximal large bowel, with redundant hepatic flexure and cecum located on a lax mesentery in the right upper quadrant. However, no large bowel inflammation identified. Normal contrast containing appendix on series 2, image 40 which tracks laterally and inferiorly. Decompressed terminal ileum and no  dilated small bowel. Decompressed stomach and duodenum. No free air. Vascular/Lymphatic: Normal caliber abdominal aorta. Major arterial structures appear to remain patent. Mild-to-moderate Aortoiliac calcified atherosclerosis. Portal venous system appears to be patent. No lymphadenopathy identified. Reproductive: Dilated prostatic urethra, questionable previous tear (series 2, image 96). Superimposed bladder distension as above. Superimposed heterogeneous prostate hypertrophy. Otherwise negative. Other: Nonspecific trace presacral stranding. No layering pelvic free fluid. Musculoskeletal: Advanced lumbar disc and endplate degeneration. No acute or suspicious osseous lesion. Nonspecific bilateral body wall edema, especially at the dependent flanks. Trace ventral right lower abdominal wall subcutaneous gas, likely an injection site. IMPRESSION: 1. Highly abnormal gallbladder, with a large mixed density 5 cm calculus or mass within the lumen, and indistinct gallbladder wall. Adjacent bilobed 1.8 cm hypodense liver lesion along the medial anterior gallbladder fossa is nonspecific. Cannot exclude Cholecystitis with an adjacent small Liver Abscess. 2. Superimposed moderate to severe bilateral hydronephrosis and hydroureter with a dilated, 1800 mL Bladder. Consider Bladder Outlet Obstruction (note prostate hypertrophy with dilated prostatic urethra) versus Urinary Retention. However, difficult to exclude an 8 mm distal left ureteral calculus at the UVJ (series 2, image 87). Mild underlying nephrolithiasis. 3. Small bilateral layering pleural effusions, greater on the right, with confluent bilateral lower lobe opacity which could be Pneumonia or severe atelectasis. 4. Body wall edema.  Aortic Atherosclerosis (ICD10-I70.0). Electronically Signed: By: HGenevie AnnM.D. On: 04/23/2022 06:47     Assessment and Plan:   Atrial fib, RVR -Previous episode was noted in the setting of significant  illness    Risk Assessment/Risk  Scores:        CHA2DS2-VASc Score = 3   This indicates a 3.2% annual risk of stroke. The patient's score is based upon: CHF History: 0 HTN History: 1 Diabetes History: 1 Stroke History: 0 Vascular Disease History: 0 Age Score: 1 Gender Score: 0     For questions or updates, please contact [facility] Please consult [email or web address] for contact info under    Signed, Rosaria Ferries, PA-C  04/25/2022 1:17 PM   Attending note Patient seen and discussed with PA Barrett, I agree with her docuemntation. 77 yo male history of DM2, HTN, PAF not on anticoag due to prior hematuria/anemia presented with weakness and faver Jan 19. Diagnosed with COVID 19 and sepsis, cardiology consulted for issues with afib with RVR this admission  Follows at [facility] cardiology [place] + WBC 13.8 Hgb 12.2 Plt 184 K 4 Cr 1.11 BUN 20 Lactic acid 2.2 BNP 237 Procalc 5.36  CXR no acute process EKG afib RVR 01/2020 echo: LVE 65-70%, no WMAs, normal diasotlic fxn.   CT A/P:  1. Highly abnormal gallbladder, with a large mixed density 5 cm calculus or mass within the lumen, and indistinct gallbladder wall. Adjacent bilobed 1.8 cm hypodense liver lesion along the medial anterior gallbladder fossa is nonspecific. Cannot exclude Cholecystitis with an adjacent small Liver Abscess.  2. Superimposed moderate to severe bilateral hydronephrosis and hydroureter with a dilated, 1800 mL Bladder. Consider Bladder Outlet Obstruction (note prostate hypertrophy with dilated prostatic urethra) versus Urinary Retention. However, difficult to exclude an 8 mm distal left ureteral calculus at the UVJ (series 2, image 87). Mild underlying nephrolithiasis.  3. Small bilateral layering pleural effusions, greater on the right, with confluent bilateral lower lobe opacity which could be Pneumonia or severe atelectasis.  4. Body wall edema.  Aortic Atherosclerosis  (ICD10-I70.0).      1.Afib -new diagnosis during 01/2020 admission. Had recent COVID infection around that time, presented with N/V and hyperglyemia.  - it appears has not had clear recurrence based on Jan 2024 [facility] cards notes until this admit, once again in the setting of COVID infection but also klebsiella bacteremia - has not been on anticoag due to hematuria/anemia  - currently on dilt 240, lopresor '25mg'$  bid. BP's are tolerating. Also received IV dilt '10mg'$  x1 today at 1130AM - I think rates will continue to be an issue given ongong systemic illness. Would start IV amio, load over 24 hours and then plan for 1 month of oral amio as outpatient then stop. Similar to 2021 I think his afib likely will be better controlled when systemic illness has resolved.   2. Klebsiella bacteremia - per primary team  3. Hydroneprhosis - per neprhology  4. COVID 19 - completed paxlovid x 5 days     Carlyle Dolly MD

## 2022-04-25 NOTE — Progress Notes (Signed)
Occupational Therapy Treatment Patient Details Name: David Sharp MRN: [id number] DOB: 04/15/1945 Today's Date: 04/25/2022   History of present illness Patient is a 77 year old male presenting to [facility] ED on 04/17/22 after slipping out of bed, found to be (+)COVID. Pt also with sepsis and rhabdomyolysis.  PMH: BPH, GERD, indwelling foley, HLD, HTN, DM, afib   OT comments  Patient presents with flat affect. He is depressed because he couldn't go home today but of Cardiology consult. He has new onset of Afib. He reported no ADL needs so he ambulated in the hall with walker x  100 feet. He keeps the walker too far forward and has a kyphotic posture. He chooses not to follow instructions to bring walker closer. He is able to perform bed transfers without assistance but needs a lot of time. He continues to present with generalized weakness and still reports generalized muscle pain. Home Health is still recommended.   Recommendations for follow up therapy are one component of a multi-disciplinary discharge planning process, led by the attending physician.  Recommendations may be updated based on patient status, additional functional criteria and insurance authorization.    Follow Up Recommendations  Home health OT     Assistance Recommended at Discharge Intermittent Supervision/Assistance  Patient can return home with the following  Assistance with cooking/housework;Assist for transportation;Help with stairs or ramp for entrance   Equipment Recommendations  None recommended by OT    Recommendations for Other Services      Precautions / Restrictions Precautions Precautions: Fall Precaution Comments: monitor HR Restrictions Weight Bearing Restrictions: No       Mobility Bed Mobility Overal bed mobility: Modified Independent             General bed mobility comments: increased time to transfer in and out of bed. Patient using UEs to assist with LEs    Transfers Overall transfer  level: Needs assistance Equipment used: Rolling walker (2 wheels) Transfers: Sit to/from Stand Sit to Stand: Min guard, From elevated surface           General transfer comment: extra time & effort to power up to standing. Needed elevated surface     Balance Overall balance assessment: Mild deficits observed, not formally tested                                         ADL either performed or assessed with clinical judgement   ADL                                              Extremity/Trunk Assessment Upper Extremity Assessment Upper Extremity Assessment: Overall WFL for tasks assessed   Lower Extremity Assessment Lower Extremity Assessment: Defer to PT evaluation   Cervical / Trunk Assessment Cervical / Trunk Assessment: Kyphotic    Vision Patient Visual Report: No change from baseline     Perception     Praxis      Cognition Arousal/Alertness: Awake/alert Behavior During Therapy: WFL for tasks assessed/performed Overall Cognitive Status: Within Functional Limits for tasks assessed  Exercises      Shoulder Instructions       General Comments      Pertinent Vitals/ Pain       Pain Assessment Pain Assessment: Faces Faces Pain Scale: Hurts a little bit Pain Location: generalized muscle sorenes Pain Descriptors / Indicators: Grimacing Pain Intervention(s): Monitored during session  Home Living                                          Prior Functioning/Environment              Frequency  Min 2X/week        Progress Toward Goals  OT Goals(current goals can now be found in the care plan section)  Progress towards OT goals: Progressing toward goals  Acute Rehab OT Goals Patient Stated Goal: go home OT Goal Formulation: With patient Time For Goal Achievement: 05/03/22 Potential to Achieve Goals: Good  Plan Discharge plan remains  appropriate    Co-evaluation                 AM-PAC OT "6 Clicks" Daily Activity     Outcome Measure   Help from another person eating meals?: None Help from another person taking care of personal grooming?: None Help from another person toileting, which includes using toliet, bedpan, or urinal?: A Little Help from another person bathing (including washing, rinsing, drying)?: A Little Help from another person to put on and taking off regular upper body clothing?: None Help from another person to put on and taking off regular lower body clothing?: A Little 6 Click Score: 21    End of Session Equipment Utilized During Treatment: Gait belt;Rolling walker (2 wheels)  OT Visit Diagnosis: Unsteadiness on feet (R26.81);Other abnormalities of gait and mobility (R26.89)   Activity Tolerance Patient tolerated treatment well   Patient Left in bed;with call bell/phone within reach   Nurse Communication Mobility status        Time: 1443-1500 OT Time Calculation (min): 17 min  Charges: OT General Charges $OT Visit: 1 Visit OT Treatments $Therapeutic Activity: 8-22 mins  Gustavo Lah, OTR/L [place]  Office [phone]   Lenward Chancellor 04/25/2022, 3:42 PM

## 2022-04-25 NOTE — Progress Notes (Signed)
   04/25/22 1023  Assess: MEWS Score  Temp 98.2 F (36.8 C)  BP 125/73  MAP (mmHg) 88  Pulse Rate 65  ECG Heart Rate (!) 122  Resp 18  Level of Consciousness Alert  SpO2 94 %  O2 Device Room Air  Assess: MEWS Score  MEWS Temp 0  MEWS Systolic 0  MEWS Pulse 2  MEWS RR 0  MEWS LOC 0  MEWS Score 2  MEWS Score Color Yellow  Assess: if the MEWS score is Yellow or Red  Were vital signs taken at a resting state? Yes  Focused Assessment Change from prior assessment (see assessment flowsheet)  Does the patient meet 2 or more of the SIRS criteria? No  MEWS guidelines implemented *See Row Information* No, previously yellow, continue vital signs every 4 hours  Treat  MEWS Interventions Escalated (See documentation below);Administered scheduled meds/treatments;Administered prn meds/treatments  Pain Scale 0-10  Take Vital Signs  Increase Vital Sign Frequency  Yellow: Q 2hr X 2 then Q 4hr X 2, if remains yellow, continue Q 4hrs  Escalate  MEWS: Escalate Yellow: discuss with charge nurse/RN and consider discussing with provider and RRT  Notify: Charge Nurse/RN  Name of Charge Nurse/RN Notified Jeninifer RN  Date Charge Nurse/RN Notified 04/25/22  Time Charge Nurse/RN Notified 1205  Provider Notification  Provider Name/Title Dr RAI  Date Provider Notified 04/25/22  Time Provider Notified 6  Method of Notification  (secure chat)  Notification Reason Other (Comment) (RVR)  Provider response See new orders  Date of Provider Response 04/25/22  Time of Provider Response 1055  Document  Patient Outcome Not stable and remains on department  Progress note created (see row info) Yes  Assess: SIRS CRITERIA  SIRS Temperature  0  SIRS Pulse 1  SIRS Respirations  0  SIRS WBC 0  SIRS Score Sum  1   Patient in Afib RVR EKG done per order. Cardizem given per order.

## 2022-04-26 DIAGNOSIS — I10 Essential (primary) hypertension: Secondary | ICD-10-CM | POA: Diagnosis not present

## 2022-04-26 DIAGNOSIS — U071 COVID-19: Secondary | ICD-10-CM | POA: Diagnosis not present

## 2022-04-26 DIAGNOSIS — I4891 Unspecified atrial fibrillation: Secondary | ICD-10-CM | POA: Diagnosis not present

## 2022-04-26 DIAGNOSIS — E113553 Type 2 diabetes mellitus with stable proliferative diabetic retinopathy, bilateral: Secondary | ICD-10-CM | POA: Diagnosis not present

## 2022-04-26 LAB — GLUCOSE, CAPILLARY
Glucose-Capillary: 282 mg/dL — ABNORMAL HIGH (ref 70–99)
Glucose-Capillary: 273 mg/dL — ABNORMAL HIGH (ref 70–99)
Glucose-Capillary: 154 mg/dL — ABNORMAL HIGH (ref 70–99)
Glucose-Capillary: 167 mg/dL — ABNORMAL HIGH (ref 70–99)

## 2022-04-26 LAB — MAGNESIUM: Magnesium: 1.6 mg/dL — ABNORMAL LOW (ref 1.7–2.4)

## 2022-04-26 LAB — CK TOTAL AND CKMB (NOT AT ARMC)
CK, MB: 1.1 ng/mL (ref 0.5–5.0)
Relative Index: INVALID (ref 0.0–2.5)
Total CK: 57 U/L (ref 49–397)

## 2022-04-26 LAB — D-DIMER, QUANTITATIVE: D-Dimer, Quant: 4.53 ug/mL-FEU — ABNORMAL HIGH (ref 0.00–0.50)

## 2022-04-26 LAB — PHOSPHORUS: Phosphorus: 3.1 mg/dL (ref 2.5–4.6)

## 2022-04-26 LAB — C-REACTIVE PROTEIN: CRP: 6.8 mg/dL — ABNORMAL HIGH (ref <1.0)

## 2022-04-26 LAB — SEDIMENTATION RATE: Sed Rate: 51 mm/hr — ABNORMAL HIGH (ref 0–16)

## 2022-04-26 LAB — LACTATE DEHYDROGENASE: LDH: 154 U/L (ref 98–192)

## 2022-04-26 LAB — FERRITIN: Ferritin: 148 ng/mL (ref 24–336)

## 2022-04-26 MED ORDER — MAGNESIUM SULFATE 4 GM/100ML IV SOLN
4.0000 g | Freq: Once | INTRAVENOUS | Status: AC
  Administered 2022-04-26: 4 g via INTRAVENOUS
  Filled 2022-04-26: qty 100

## 2022-04-26 MED ORDER — AMIODARONE LOAD VIA INFUSION
150.0000 mg | Freq: Once | INTRAVENOUS | Status: AC
  Administered 2022-04-26: 150 mg via INTRAVENOUS
  Filled 2022-04-26: qty 83.34

## 2022-04-26 MED ORDER — MAGNESIUM SULFATE 50 % IJ SOLN
4.0000 g | Freq: Once | INTRAVENOUS | Status: DC
  Filled 2022-04-26: qty 8

## 2022-04-26 MED ORDER — INSULIN GLARGINE-YFGN 100 UNIT/ML ~~LOC~~ SOLN
20.0000 [IU] | Freq: Two times a day (BID) | SUBCUTANEOUS | Status: DC
  Administered 2022-04-26 – 2022-04-27 (×2): 20 [IU] via SUBCUTANEOUS
  Filled 2022-04-26 (×3): qty 0.2

## 2022-04-26 MED ORDER — POTASSIUM CHLORIDE CRYS ER 20 MEQ PO TBCR
40.0000 meq | EXTENDED_RELEASE_TABLET | Freq: Once | ORAL | Status: AC
  Administered 2022-04-26: 40 meq via ORAL
  Filled 2022-04-26: qty 2

## 2022-04-26 NOTE — Progress Notes (Signed)
Please see full consult from yesterday. Afib with RVR in setting of COVID, klebsiella bacteremia. Failed attempts at rate control, plans for IV amio and then short outpatient oral course. Afib likely should resolve once systemic illness has resolved similar to his prior history.  On amio gtt, remains in afib this AM 110s. Looks like drip was not started until 10pm last night. Rebolus '150mg'$  and continue amio gtt today. Not on anticoag due to hematuria/anemia history.     Carlyle Dolly MD

## 2022-04-26 NOTE — Progress Notes (Signed)
[facility]                                                                              David Sharp, is a 77 y.o. male, DOB - 1945/04/18, SFK:[id number] Admit date - 04/17/2022    Outpatient Primary MD for the patient is [name], MD  LOS - 8  days  Chief Complaint  Patient presents with   Weakness       Brief summary   Patient is a 77 year old male with diabetes mellitus type 2, IDA,HLD, HTN, BPH, afib who presented to ED with complaints of weakness and fever.  On the morning of admission, patient's son could not get him out of the bed, had slid down on the floor.  His wife had tested positive for COVID before.  He had a fever 103 F, no cough or congestion. Patient was admitted for further workup.   Assessment & Plan    Principal Problem:   Severe Sepsis ([place]) POA, with Klebsiella pneumonia bacteremia Gallbladder wall thickening, gallstones with adjacent liver fluid collection -Patient met sepsis criteria on admission with fevers, tachycardia, leukocytosis, lactic acidosis, AKI -Blood cultures positive for Klebsiella bacteremia although UA and culture + 70K  staph aureus -CT abdomen pelvis showed large 5 cm calculus or mass in gallbladder, ?  Acute cholecystitis with an adjacent small liver abscess.  Superimposed moderate to severe bilateral hydronephrosis and hydroureter, with a dilated 1800 cc bladder, consider bladder outlet obstruction -US abdomen: Large stone without gallbladder wall thickening and negative Murphy sign, no focal liver lesion -General surgery consulted, recommended outpatient follow-up in clinic to discuss cholecystectomy once acute issues are resolved. -ID consulted, transition to cefadroxil 1 gm BID, stop date 1/28  -Currently no  acute issues  Active Problems:  Atrial fibrillation with RVR -Diagnosed in 01/2020, history of paroxysmal Afib, follows cardiology outpatient, was taken off of Eliquis due to hematuria and  anemia. -Currently on Cardizem 240 mg daily, Lopressor was added 25 mg twice daily  -Cardiology consulted, started on IV amiodarone   Moderate-severe bilateral hydronephrosis, hydroureter with possible 8 mm distal left ureteral calculus at UVJ, urinary retention Underlying history of BPH -Foley catheter placed, continue Flomax, added finasteride -Appreciate urology recommendations, recommended to continue Foley catheter and will need outpatient follow-up with his urologist in [facility], Dr. Thomasene Mohair.  As hydronephrosis has resolved on ultrasound, does not need repeat CT emergently however recommended may likely need to repeat CT in a week to see if stone has passed.  Generalized debility due to COVID-19  -Improving from COVID standpoint, O2 sats 96% on room air.   - CXR on 1/19 with no infiltrates, CRP trending down -Completed Paxlovid x 5 days  Hypomagnesemia -Replaced IV  Acute rhabdomyolysis with acute kidney injury -Likely due to being on the floor. CK increased to 4876.  Creatinine 1.8 on 04/19/2022 -CK improved, 57, renal function improved -Continue to hold Lipitor due to transaminitis, rhabdo -Creatinine 1.0   Type 2 diabetes mellitus with stable proliferative retinopathy of both eyes, without long-term current use of insulin (HCC) - A1C at 8.5 -Continue to hold metformin while inpatient CBG (last 3)  Recent Labs    04/25/22 2310 04/26/22 1036 04/26/22 1137  GLUCAP 220* 282* 273*   -CBGs elevated, increased Semglee to 20 units twice daily, continue NovoLog 5 units 3 times daily AC, SSI    Essential hypertension -BP stable, continue Cardizem, beta-blocker -Holding ACE inhibitor  Dyslipidemia -Lipitor was held due to rhabdomyolysis and transamnitis -Continue to hold Lipitor   Iron deficiency anemia H&H stable    Sacral decubitus ulcer, stage II (HCC) -POA -Wound care per nursing    Obesity (BMI 30-39.9)  Estimated body mass index is 32.37 kg/m as  calculated from the following:   Height as of this encounter: 5' 11.75" (1.822 m).   Weight as of this encounter: 107.5 kg.  Code Status: Full CODE STATUS DVT Prophylaxis:  enoxaparin (LOVENOX) injection 40 mg Start: 04/17/22 2359   Level of Care: Level of care: Progressive Family Communication:  Disposition Plan:      Remains inpatient appropriate: PT OT evaluation, plan to DC home tomorrow if heart rate controlled and cleared by cardiology   Procedures:  CT abdomen and pelvis  Consultants:   General surgery Infectious disease Urology  Antimicrobials:   Anti-infectives (From admission, onward)    Start     Dose/Rate Route Frequency Ordered Stop   04/23/22 1000  cefadroxil (DURICEF) capsule 1,000 mg        1,000 mg Oral 2 times daily 04/22/22 1216 04/27/22 1108   04/18/22 1000  cefTRIAXone (ROCEPHIN) 2 g in sodium chloride 0.9 % 100 mL IVPB  Status:  Discontinued        2 g 200 mL/hr over 30 Minutes Intravenous Every 24 hours 04/18/22 0900 04/22/22 1216   04/17/22 2330  nirmatrelvir/ritonavir (PAXLOVID) 3 tablet        3 tablet Oral 2 times daily 04/17/22 2327 04/22/22 9024          Medications  vitamin C  500 mg Oral Daily   aspirin EC  81 mg Oral Daily   cefadroxil  1,000 mg Oral BID   Chlorhexidine Gluconate Cloth  6 each Topical Daily   cholecalciferol  5,000 Units Oral Daily   diltiazem  240 mg Oral Daily   enoxaparin (LOVENOX) injection  40 mg Subcutaneous Q24H   finasteride  5 mg Oral Daily   insulin aspart  0-15 Units Subcutaneous TID WC   insulin aspart  0-5 Units Subcutaneous QHS   insulin aspart  5 Units Subcutaneous TID WC   insulin glargine-yfgn  18 Units Subcutaneous BID   melatonin  10 mg Oral QHS   multivitamin with minerals  1 tablet Oral Daily   tamsulosin  0.8 mg Oral Daily   zinc sulfate  220 mg Oral Daily      Subjective:   David Sharp was seen and examined today.  Overall improving, heart rate controlled on amiodarone drip.  Hoping  to go home soon.  No fevers, no nausea vomiting, abdominal pain.  Foley catheter +  Objective:   Vitals:   04/26/22 0252 04/26/22 0655 04/26/22 1019 04/26/22 1021  BP: (!) 149/87 (!) 146/85 (!) 146/101 (!) 149/68  Pulse: 83 98 (!) 56 (!) 109  Resp:  17 18   Temp: 98.2 F (36.8 C) 98.3 F (36.8 C) 98.5 F (36.9 C)   TempSrc: Oral Oral Oral   SpO2: 93% 94% 95% 94%  Weight:      Height:        Intake/Output Summary (Last 24 hours) at 04/26/2022 1240 Last data filed  at 04/26/2022 0900 Gross per 24 hour  Intake 240 ml  Output 1630 ml  Net -1390 ml     Wt Readings from Last 3 Encounters:  04/17/22 107.5 kg  03/02/20 115.2 kg  01/31/20 113.4 kg   Physical Exam General: Alert and oriented x 3, NAD Cardiovascular: Irregularly irregular Respiratory: CTAB, no wheezing Gastrointestinal: Soft, nontender, nondistended, NBS Ext: no pedal edema bilaterally Neuro: no new deficits Skin: No rashes Psych: Normal affect     Data Reviewed:  I have personally reviewed following labs    CBC Lab Results  Component Value Date   WBC 8.3 04/25/2022   RBC 3.85 (L) 04/25/2022   HGB 11.3 (L) 04/25/2022   HCT 33.8 (L) 04/25/2022   MCV 87.8 04/25/2022   MCH 29.4 04/25/2022   PLT 310 04/25/2022   MCHC 33.4 04/25/2022   RDW 14.6 04/25/2022   LYMPHSABS 1.2 04/25/2022   MONOABS 0.7 04/25/2022   EOSABS 0.4 04/25/2022   BASOSABS 0.1 83/15/1761     Last metabolic panel Lab Results  Component Value Date   NA 135 04/25/2022   K 3.4 (L) 04/25/2022   CL 102 04/25/2022   CO2 25 04/25/2022   BUN 13 04/25/2022   CREATININE 1.01 04/25/2022   GLUCOSE 213 (H) 04/25/2022   GFRNONAA >60 04/25/2022   CALCIUM 8.0 (L) 04/25/2022   PHOS 3.1 04/26/2022   PROT 5.5 (L) 04/25/2022   ALBUMIN 2.3 (L) 04/25/2022   BILITOT 0.4 04/25/2022   ALKPHOS 55 04/25/2022   AST 52 (H) 04/25/2022   ALT 67 (H) 04/25/2022   ANIONGAP 8 04/25/2022    CBG (last 3)  Recent Labs    04/25/22 2310  04/26/22 1036 04/26/22 1137  GLUCAP 220* 282* 273*      Coagulation Profile: No results for input(s): "INR", "PROTIME" in the last 168 hours.   Radiology Studies: I have personally reviewed the imaging studies  No results found.     Estill Cotta M.D. [facility] 04/26/2022, 12:40 PM  Available via Epic secure chat 7am-7pm After 7 pm, please refer to night coverage provider listed on amion.

## 2022-04-27 DIAGNOSIS — A419 Sepsis, unspecified organism: Secondary | ICD-10-CM | POA: Diagnosis not present

## 2022-04-27 DIAGNOSIS — M6282 Rhabdomyolysis: Secondary | ICD-10-CM | POA: Diagnosis not present

## 2022-04-27 DIAGNOSIS — I4891 Unspecified atrial fibrillation: Secondary | ICD-10-CM | POA: Diagnosis not present

## 2022-04-27 DIAGNOSIS — E113553 Type 2 diabetes mellitus with stable proliferative diabetic retinopathy, bilateral: Secondary | ICD-10-CM | POA: Diagnosis not present

## 2022-04-27 DIAGNOSIS — U071 COVID-19: Secondary | ICD-10-CM | POA: Diagnosis not present

## 2022-04-27 LAB — COMPREHENSIVE METABOLIC PANEL
ALT: 50 U/L — ABNORMAL HIGH (ref 0–44)
AST: 39 U/L (ref 15–41)
Albumin: 2.2 g/dL — ABNORMAL LOW (ref 3.5–5.0)
Alkaline Phosphatase: 51 U/L (ref 38–126)
Anion gap: 7 (ref 5–15)
BUN: 14 mg/dL (ref 8–23)
CO2: 26 mmol/L (ref 22–32)
Calcium: 8.1 mg/dL — ABNORMAL LOW (ref 8.9–10.3)
Chloride: 103 mmol/L (ref 98–111)
Creatinine, Ser: 1.13 mg/dL (ref 0.61–1.24)
GFR, Estimated: >60 mL/min (ref >60)
Glucose, Bld: 96 mg/dL (ref 70–99)
Potassium: 3.9 mmol/L (ref 3.5–5.1)
Sodium: 136 mmol/L (ref 135–145)
Total Bilirubin: 0.4 mg/dL (ref 0.3–1.2)
Total Protein: 5.2 g/dL — ABNORMAL LOW (ref 6.5–8.1)

## 2022-04-27 LAB — GLUCOSE, CAPILLARY
Glucose-Capillary: 101 mg/dL — ABNORMAL HIGH (ref 70–99)
Glucose-Capillary: 248 mg/dL — ABNORMAL HIGH (ref 70–99)

## 2022-04-27 LAB — TSH: TSH: 3.549 u[IU]/mL (ref 0.350–4.500)

## 2022-04-27 LAB — CULTURE, BLOOD (ROUTINE X 2)
Culture: NO GROWTH
Culture: NO GROWTH
Special Requests: ADEQUATE
Special Requests: ADEQUATE

## 2022-04-27 LAB — MAGNESIUM: Magnesium: 2.2 mg/dL (ref 1.7–2.4)

## 2022-04-27 LAB — C-REACTIVE PROTEIN: CRP: 5.2 mg/dL — ABNORMAL HIGH (ref <1.0)

## 2022-04-27 MED ORDER — DILTIAZEM HCL ER COATED BEADS 240 MG PO CP24: 240.0000 mg | ORAL_CAPSULE | Freq: Every day | ORAL | 3 refills | Status: AC

## 2022-04-27 MED ORDER — ATORVASTATIN CALCIUM 20 MG PO TABS: 20.0000 mg | ORAL_TABLET | Freq: Every day | ORAL | 1 refills | Status: AC

## 2022-04-27 MED ORDER — FINASTERIDE 5 MG PO TABS: 5.0000 mg | ORAL_TABLET | Freq: Every day | ORAL | 3 refills | Status: AC

## 2022-04-27 MED ORDER — AMIODARONE HCL 200 MG PO TABS: ORAL_TABLET | ORAL | 0 refills | Status: AC

## 2022-04-27 MED ORDER — ALBUTEROL SULFATE HFA 108 (90 BASE) MCG/ACT IN AERS: 2.0000 | INHALATION_SPRAY | Freq: Four times a day (QID) | RESPIRATORY_TRACT | 1 refills | Status: AC | PRN

## 2022-04-27 MED ORDER — AMIODARONE HCL 200 MG PO TABS
200.0000 mg | ORAL_TABLET | Freq: Two times a day (BID) | ORAL | Status: DC
  Administered 2022-04-27: 200 mg via ORAL
  Filled 2022-04-27: qty 1

## 2022-04-27 MED ORDER — TAMSULOSIN HCL 0.4 MG PO CAPS: 0.8000 mg | ORAL_CAPSULE | Freq: Every day | ORAL | 3 refills | Status: AC

## 2022-04-27 MED ORDER — AMIODARONE HCL 200 MG PO TABS: 200.0000 mg | ORAL_TABLET | Freq: Every day | ORAL | Status: DC

## 2022-04-27 NOTE — TOC Progression Note (Signed)
Transition of Care [facility]) - Progression Note    Patient Details  Name: DESTEN MANOR MRN: [id number] Date of Birth: 11-10-1945  [facility] CM/SW Contact  Leeroy Cha, RN Phone Number: 04/27/2022, 8:11 AM  Clinical Narrative:    Chart reivewed following for toc needs.   Expected Discharge Plan: [place] Barriers to Discharge: No Barriers Identified  Expected Discharge Plan and Services In-house Referral: Clinical Social Work Discharge Planning Services: CM Consult Post Acute Care Choice: [place] arrangements for the past 2 months: Single Family Home                 DME Arranged: N/A DME Agency: NA       HH Arranged: PT, OT HH Agency: [place] Date HH Agency Contacted: 04/20/22 Time [place]: 1210 Representative spoke with at [place]: Progress Determinants of Health ([place]) Interventions [place]: No Food Insecurity (04/18/2022)  Housing: Low Risk  (04/18/2022)  Transportation Needs: No Transportation Needs (04/18/2022)  Utilities: Not At Risk (04/18/2022)  Tobacco Use: Low Risk  (04/17/2022)    Readmission Risk Interventions   Row Labels 04/20/2022   12:08 PM  Readmission Risk Prevention [place]. No data exists in this row.   Transportation Screening   Complete  PCP or Specialist Appt within 5-7 Days   Complete  Home Care Screening   Complete  Medication Review (RN CM)   Complete

## 2022-04-27 NOTE — Progress Notes (Signed)
AVS and discharge instructions reviewed w/ patient and wife at the bedside. Both verbalized understanding and had no further questions. Foley care instructions and extra supplies provided for patient.

## 2022-04-27 NOTE — Progress Notes (Addendum)
Rounding Note    Patient Name: David Sharp Date of Encounter: 04/27/2022  [place] Cardiologist: follows at [facility]   He states he is feeling improved, wants to go home. He denied any chest pain or palpitation. He states he walked yesterday and tolerated well. He can't recall the reason he was taken off Eliquis, states he saw his cardiologist Jan and was not told to take anticoagulation. He believed he had bleeding in the past but does not remember much of it.   Inpatient Medications    Scheduled Meds:  vitamin C  500 mg Oral Daily   aspirin EC  81 mg Oral Daily   cefadroxil  1,000 mg Oral BID   Chlorhexidine Gluconate Cloth  6 each Topical Daily   cholecalciferol  5,000 Units Oral Daily   diltiazem  240 mg Oral Daily   enoxaparin (LOVENOX) injection  40 mg Subcutaneous Q24H   finasteride  5 mg Oral Daily   insulin aspart  0-15 Units Subcutaneous TID WC   insulin aspart  0-5 Units Subcutaneous QHS   insulin aspart  5 Units Subcutaneous TID WC   insulin glargine-yfgn  20 Units Subcutaneous BID   melatonin  10 mg Oral QHS   multivitamin with minerals  1 tablet Oral Daily   tamsulosin  0.8 mg Oral Daily   zinc sulfate  220 mg Oral Daily   Continuous Infusions:  amiodarone 30 mg/hr (04/27/22 0511)   PRN Meds: acetaminophen, albuterol, guaiFENesin-dextromethorphan   Vital Signs    Vitals:   04/26/22 1422 04/26/22 2014 04/27/22 0039 04/27/22 0458  BP: 134/83 133/67 (!) 150/70 (!) 154/70  Pulse: 92 69 71 71  Resp: '18 19  17  '$ Temp: (!) 97.5 F (36.4 C) 98.5 F (36.9 C) 98.7 F (37.1 C) 98.4 F (36.9 C)  TempSrc: Oral Oral Oral Oral  SpO2: 94% 94% 93% 94%  Weight:    114.1 kg  Height:        Intake/Output Summary (Last 24 hours) at 04/27/2022 0905 Last data filed at 04/27/2022 2440 Gross per 24 hour  Intake 523.37 ml  Output 1300 ml  Net -776.63 ml      04/27/2022    4:58 AM 04/17/2022    5:52 PM 03/02/2020   11:27 AM   Last 3 Weights  Weight (lbs) 251 lb 8.7 oz 237 lb 254 lb  Weight (kg) 114.1 kg 107.502 kg 115.214 kg      Telemetry    Sinus rhythm 80s, converted from A fib RVR 110s since 04/26/22 around 3:11pm - Personally Reviewed  ECG    N/A today - Personally Reviewed  Physical Exam   GEN: No acute distress. Globally weak   Neck: No JVD Cardiac: RRR, no murmurs, rubs, or gallops.  Respiratory: Coarse of right lung, clear of left lung, on room air, speak full sentence  GI: Soft, nontender MS: No leg edema Neuro:  Alert and oriented to place/person/time, able to follow commands and maintain appopriate conversation  Psych: Normal affect   Labs    High Sensitivity Troponin:   Recent Labs  Lab 04/18/22 0002 04/18/22 1355  TROPONINIHS 49* 122*     Chemistry Recent Labs  Lab 04/24/22 0535 04/25/22 0715 04/26/22 0527 04/27/22 0442  NA 140 135  --  136  K 3.4* 3.4*  --  3.9  CL 107 102  --  103  CO2 25 25  --  26  GLUCOSE 143* 213*  --  96  BUN 15 13  --  14  CREATININE 0.95 1.01  --  1.13  CALCIUM 8.3* 8.0*  --  8.1*  MG 1.8 1.7 1.6* 2.2  PROT 5.5* 5.5*  --  5.2*  ALBUMIN 2.3* 2.3*  --  2.2*  AST 72* 52*  --  39  ALT 75* 67*  --  50*  ALKPHOS 57 55  --  51  BILITOT 0.4 0.4  --  0.4  GFRNONAA >60 >60  --  >60  ANIONGAP 8 8  --  7    Lipids No results for input(s): "CHOL", "TRIG", "HDL", "LABVLDL", "LDLCALC", "CHOLHDL" in the last 168 hours.  Hematology Recent Labs  Lab 04/23/22 0624 04/24/22 0535 04/25/22 0715  WBC 11.8* 8.7 8.3  RBC 3.81* 3.69* 3.85*  HGB 11.1* 10.7* 11.3*  HCT 33.4* 32.2* 33.8*  MCV 87.7 87.3 87.8  MCH 29.1 29.0 29.4  MCHC 33.2 33.2 33.4  RDW 15.1 14.8 14.6  PLT 209 249 310   Thyroid No results for input(s): "TSH", "FREET4" in the last 168 hours.  BNPNo results for input(s): "BNP", "PROBNP" in the last 168 hours.  DDimer  Recent Labs  Lab 04/24/22 0535 04/25/22 0715 04/26/22 0527  DDIMER 3.75* 3.90* 4.53*     Radiology    No  results found.  Cardiac Studies   Echo from 02/02/20:  1. Left ventricular ejection fraction, by estimation, is 65 to 70%. The  left ventricle has normal function. The left ventricle has no regional  wall motion abnormalities. There is mild left ventricular hypertrophy.  Left ventricular diastolic parameters  were normal.   2. Right ventricular systolic function is normal. The right ventricular  size is normal.   3. No evidence of mitral valve regurgitation.   4. Mild aortic valve sclerosis is present, with no evidence of aortic  valve stenosis.    Patient Profile     77 y.o. male with PMH of HTN, HLD, BPH, type 2 DM, IDA, paroxysmal A fib , cardiology is consulted for paroxysmal A fib RVR in the setting of fell at home, COVID-19 infection, Klebsiella pneumoniae bacteremia 2/2 likely recent acute cholecystitis, rhabdomyolysis, AKI, acute urinary retention with hydronephrosis, and type 2 DM with hyperglycemia.   Assessment & Plan    Paroxysmal A fib with RVR -initially diagnosed with paroxysmal A fib RVR 01/2020 during hospitalization for uncontrolled type 2 DM with hyperglycemia, follows at [facility], historically on diltiazem '180mg'$  for rate control, not on anticoagulation PTA , Echo from 2021 with LVEF preserved and no valvular disease   - Likely acute illness mediated by COVID, Klebsiella pneumoniae bacteremia, rhabdomyolysis, AKI, hydronephrosis, etc. - Rate control was challenging with diltiazem '240mg'$  and metoprolol '25mg'$  BID, metoprolol was stopped, he was loaded with amiodarone since 04/25/22, will transition to PO amiodarone today with '200mg'$  BID for 14 days and then '200mg'$  daily for 14 days then stop, plan for a short course while await acute illness resolve  - added TSH today, transaminitis has mostly recovered since 1/29 - Not on anticoagulation historically, patient believes it was due to bleeding but can't recall clearly, currently held due to hematuria and anemia, should  reconsider if no contraindication, CHA2DS2VASc score 3 due to age, HTN, DM - Follow up with [facility] upon discharge   Elevated troponin  - Hs trop 49 >122 - EKG at admission no acute ST-T change from 01/2020 -no chest pain - likely demand   Sepsis  Klebsiella pneumoniae bacteremia 2/2 GI  source AKI and acute urinary retention with hydronephrosis Rhabdomyolysis with fall at home Type 2 DM with hyperglycemia  Iron deficiency anemia  HTN HLD BPH - per primary team    [place] will sign off.   Medication Recommendations:  Amiodarone 200 mg BID x2 weeks then 200 mg daily x 2 weeks then stop.  Continue diltiazem 240 mg daily.  Consider anticoagulation as outpatient. Other recommendations (labs, testing, etc):  None Follow up as an outpatient:  Needs f/u with his cardiologist at [place]   For questions or updates, please contact [place] Please consult [email or web address] for contact info under      Signed, Margie Billet, NP  04/27/2022, 9:05 AM     Patient seen and examined.  Agree with above documentation.  On exam, patient is alert and oriented, regular rate and rhythm, no murmurs, lungs CTAB, no LE edema or JVD.   Converted to NSR.  Will convert to PO amio and plan short course as recovers from acute illness.  He is not on anticoagulation due to history of anemia/hematuria, but appears that occurred in setting of traumatic Foley placement.  Consider rechallenging with anticoagulation as outpatient.  He will f/u with his cardiologist at [place].  Donato Heinz, MD

## 2022-04-27 NOTE — Plan of Care (Signed)
  Problem: Fluid Volume: Goal: Ability to maintain a balanced intake and output will improve Outcome: Progressing   Problem: Nutritional: Goal: Maintenance of adequate nutrition will improve Outcome: Progressing   Problem: Tissue Perfusion: Goal: Adequacy of tissue perfusion will improve Outcome: Progressing   Problem: Respiratory: Goal: Will maintain a patent airway Outcome: Progressing   Problem: Education: Goal: Knowledge of General Education information will improve Description: Including pain rating scale, medication(s)/side effects and non-pharmacologic comfort measures Outcome: Progressing

## 2022-04-27 NOTE — Care Management Important Message (Signed)
Important Message  Patient Details IM Letter given. Name: David Sharp MRN: [id number] Date of Birth: 02/24/1946   Medicare Important Message Given:  Yes     Kerin Salen 04/27/2022, 2:05 PM

## 2022-04-27 NOTE — TOC Transition Note (Signed)
Transition of Care [facility]) - CM/SW Discharge Note   Patient Details  Name: David Sharp MRN: [id number] Date of Birth: September 19, 1945  [facility]) CM/SW Contact:  Leeroy Cha, RN Phone Number: 04/27/2022, 2:21 PM   Clinical Narrative:    PATIENT DCD TO HOME WITH [facility].   Final next level of care: Home w Home Health Services Barriers to Discharge: Barriers Resolved   Patient Goals and CMS Choice CMS Medicare.gov Compare Post Acute Care list provided to:: Patient Choice offered to / list presented to : Patient, Spouse  Discharge Placement                         Discharge Plan and Services Additional resources added to the After Visit Summary for   In-house Referral: Clinical Social Work Discharge Planning Services: CM Consult Post Acute Care Choice: Home Health          DME Arranged: N/A DME Agency: NA       HH Arranged: PT, OT HH Agency: [place] Date [facility] Agency Contacted: 04/20/22 Time HH Agency Contacted: 1210 Representative spoke with at [place]: [place] Determinants of Health ([place]) Interventions [place]: No Food Insecurity (04/18/2022)  Housing: Low Risk  (04/18/2022)  Transportation Needs: No Transportation Needs (04/18/2022)  Utilities: Not At Risk (04/18/2022)  Tobacco Use: Low Risk  (04/17/2022)     Readmission Risk Interventions   Row Labels 04/20/2022   12:08 PM  Readmission Risk Prevention [place]. No data exists in this row.   Transportation Screening   Complete  PCP or Specialist Appt within 5-7 Days   Complete  Home Care Screening   Complete  Medication Review (RN CM)   Complete

## 2022-04-27 NOTE — Progress Notes (Signed)
Physical Therapy Treatment Patient Details Name: David Sharp MRN: [id number] DOB: 08/09/1945 Today's Date: 04/27/2022   History of Present Illness Patient is a 77 year old male presenting to [facility] ED on 04/17/22 after slipping out of bed, found to be (+)COVID. Pt also with sepsis and rhabdomyolysis.  PMH: BPH, GERD, indwelling foley, HLD, HTN, DM, afib    PT Comments    Pt agreeable to therapy, voicing multiple frustrations regarding still being in the hospital and just wanting to go home. Pt needing increased time with bed mobility, no physical assist or cues. Once sitting EOB, pt able to power to stand with RW and no physical assist, cues for hand placement, hip extension noted last. Pt denies dizziness with positional changes, denies pain, denies racing sensation or lightheadedness. Pt amb 80 ft with RW, supv for safety, slow cadence, flexed trunk, no overt LOB or knee buckling noted. Pt returns to room, able to perform 6 STS reps with BUE assisting, unable to complete without UE assist. Pt HR max noted 106, no complaints during session.    Recommendations for follow up therapy are one component of a multi-disciplinary discharge planning process, led by the attending physician.  Recommendations may be updated based on patient status, additional functional criteria and insurance authorization.  Follow Up Recommendations  Home health PT     Assistance Recommended at Discharge Intermittent Supervision/Assistance  Patient can return home with the following A little help with walking and/or transfers;A little help with bathing/dressing/bathroom;Assistance with cooking/housework;Help with stairs or ramp for entrance   Equipment Recommendations  None recommended by PT    Recommendations for Other Services       Precautions / Restrictions Precautions Precautions: Fall Precaution Comments: monitor HR Restrictions Weight Bearing Restrictions: No     Mobility  Bed Mobility Overal bed  mobility: Modified Independent  General bed mobility comments: increased time, no physical assist or cues    Transfers Overall transfer level: Needs assistance Equipment used: Rolling walker (2 wheels) Transfers: Sit to/from Stand Sit to Stand: Supervision  General transfer comment: supv to power to stand with BUE assisting    Ambulation/Gait Ambulation/Gait assistance: Supervision Gait Distance (Feet): 80 Feet Assistive device: Rolling walker (2 wheels) Gait Pattern/deviations: Trunk flexed, Step-through pattern, Decreased stride length Gait velocity: decreased  General Gait Details: pt with step through gait pattern, decreased stride length and cadence, maintains trunk flexed, denies pain, SOB, lightheadedness or racing sensation, HR 106 max noted with amb   Stairs             Wheelchair Mobility    Modified Rankin (Stroke Patients Only)       Balance Overall balance assessment: Needs assistance Sitting-balance support: No upper extremity supported, Feet supported Sitting balance-Leahy Scale: Good     Standing balance support: During functional activity, Bilateral upper extremity supported, Reliant on assistive device for balance Standing balance-Leahy Scale: Fair    Cognition Arousal/Alertness: Awake/alert Behavior During Therapy: WFL for tasks assessed/performed, Flat affect Overall Cognitive Status: Within Functional Limits for tasks assessed  General Comments: pt pleasant, follows commands, voicing frustrations about wanting to go home        Exercises Other Exercises Other Exercises: 6 STS reps from recliner wtih BUE assisting, supv, attempted but unable to perform wtihout UE assisting to power up    General Comments General comments (skin integrity, edema, etc.): max HR 106 noted with ambulation, in 90s with sitting in recliner      Pertinent Vitals/Pain Pain Assessment Pain  Assessment: No/denies pain    Home Living              Prior  Function            PT Goals (current goals can now be found in the care plan section) Acute Rehab PT Goals Patient Stated Goal: return home PT Goal Formulation: With patient Time For Goal Achievement: 05/03/22 Potential to Achieve Goals: Good Progress towards PT goals: Progressing toward goals    Frequency    Min 3X/week      PT Plan Current plan remains appropriate    Co-evaluation              AM-PAC PT "6 Clicks" Mobility   Outcome Measure  Help needed turning from your back to your side while in a flat bed without using bedrails?: None Help needed moving from lying on your back to sitting on the side of a flat bed without using bedrails?: None Help needed moving to and from a bed to a chair (including a wheelchair)?: A Little Help needed standing up from a chair using your arms (e.g., wheelchair or bedside chair)?: A Little Help needed to walk in hospital room?: A Little Help needed climbing 3-5 steps with a railing? : A Little 6 Click Score: 20    End of Session Equipment Utilized During Treatment: Gait belt Activity Tolerance: Patient tolerated treatment well Patient left: in chair;with call bell/phone within reach Nurse Communication: Mobility status PT Visit Diagnosis: Muscle weakness (generalized) (M62.81);Unsteadiness on feet (R26.81)     Time: 0211-1735 PT Time Calculation (min) (ACUTE ONLY): 32 min  Charges:  $Gait Training: 8-22 mins $Therapeutic Activity: 8-22 mins                      Tori Lyndy Russman PT, DPT 04/27/22, 1:01 PM

## 2022-04-27 NOTE — TOC Progression Note (Addendum)
Transition of Care [facility]) - Progression Note    Patient Details  Name: David Sharp MRN: [id number] Date of Birth: Mar 03, 1946  [facility]) CM/SW Contact  Leeroy Cha, RN Phone Number: 04/27/2022, 9:50 AM  Clinical Narrative:    Request for home pt and ot send to [facility] at 0950 Request accepted.  Expected Discharge Plan: [place] Barriers to Discharge: No Barriers Identified  Expected Discharge Plan and Services In-house Referral: Clinical Social Work Discharge Planning Services: CM Consult Post Acute Care Choice: [place] arrangements for the past 2 months: Single Family Home                 DME Arranged: N/A DME Agency: NA       HH Arranged: PT, OT HH Agency: [place] Date HH Agency Contacted: 04/20/22 Time [place]: 1210 Representative spoke with at [place]: [place] Determinants of Health ([place]) Interventions [place]: No Food Insecurity (04/18/2022)  Housing: Low Risk  (04/18/2022)  Transportation Needs: No Transportation Needs (04/18/2022)  Utilities: Not At Risk (04/18/2022)  Tobacco Use: Low Risk  (04/17/2022)    Readmission Risk Interventions   Row Labels 04/20/2022   12:08 PM  Readmission Risk Prevention [place]. No data exists in this row.   Transportation Screening   Complete  PCP or Specialist Appt within 5-7 Days   Complete  Home Care Screening   Complete  Medication Review (RN CM)   Complete

## 2022-04-27 NOTE — Discharge Summary (Signed)
Physician Discharge Summary   Patient: David Sharp MRN: [id number] DOB: [birth year]  Admit date:     04/17/2022  Discharge date: 04/27/22  Discharge Physician: Estill Cotta, MD    PCP: [name], MD   Recommendations at discharge:   Started on amiodarone 200 mg p.o. twice daily for 2 weeks then 200 mg daily for 2 weeks then stop Continue diltiazem, increased the dose to 240 mg daily Consider anticoagulation as outpatient, will defer to patient's outpatient cardiologist Please check LFTs, he may resume statins if LFTs have normalized Please check CMET for renal function Patient will be going home with Foley catheter.  He will follow-up with his urologist, Dr. Thomasene Mohair within 1 week, for voiding trial and repeat CT abdomen and pelvis.  He needs definitive management of the left UVJ stone  Discharge Diagnoses:    Severe Sepsis ([place]), POA with Klebsiella pneumonia bacteremia   Gallbladder wall thickening, gallstones   Atrial fibrillation with rapid ventricular response (HCC)   Moderate to severe bilateral hydronephrosis, hydroureter with 8 mm distal left ureteral calculus at UVJ   Acute urinary retention   BPH with urinary obstruction   Rhabdomyolysis   weakness and sepsis criteria due to COVID-19   Type 2 diabetes mellitus with stable proliferative retinopathy of both eyes, without long-term current use of insulin (HCC)   Atrial fibrillation with rapid ventricular response (HCC)   Essential hypertension   Dyslipidemia   Iron deficiency anemia   Generalized weakness   Pressure injury of skin, Sacral decubitus ulcer, stage II (HCC)   Obesity (BMI 30-39.9)    Hospital Course:  Patient is a 77 year old male with diabetes mellitus type 2, IDA,HLD, HTN, BPH, afib who presented to ED with complaints of weakness and fever.  On the morning of admission, patient's son could not get him out of the bed, had slid down on the floor.  His wife had tested positive for COVID before.  He had a  fever 103 F, no cough or congestion. Patient was admitted for further workup.   Assessment and Plan:  Severe Sepsis ([place]) POA, with Klebsiella pneumonia bacteremia Gallbladder wall thickening, gallstones with adjacent liver fluid collection -Patient met sepsis criteria on admission with fevers, tachycardia, leukocytosis, lactic acidosis, AKI -Blood cultures were positive for Klebsiella bacteremia although UA and culture + 70K  staph aureus -CT abdomen pelvis showed large 5 cm calculus or mass in gallbladder, ?  Acute cholecystitis with an adjacent fluid collection. Superimposed moderate to severe bilateral hydronephrosis and hydroureter, with a dilated 1800 cc bladder, consider bladder outlet obstruction -US abdomen showed large stone without gallbladder wall thickening and negative Murphy sign, no focal liver lesion -General surgery was consulted, recommended outpatient follow-up in clinic to discuss cholecystectomy once acute issues are resolved. -ID consulted, transition to cefadroxil 1 gm BID, patient has completed the antibiotics on 1/28 -Currently no  acute issues    Atrial fibrillation with RVR -Diagnosed in 01/2020, history of paroxysmal Afib, follows cardiology outpatient, was taken off of Eliquis due to hematuria and anemia. -Cardizem was increased to 2 to 40 mg daily and added Lopressor with no significant effect.  Cardiology was consulted and patient was placed on IV amiodarone drip for 24 hours.  -Per cardiology, likely A-fib with RVR due to acute illness, transition to p.o. amiodarone with 200 mg twice a day for 14 days then 200 mg daily for 14 days then stop.  -Currently not on anticoagulation as patient believes that it was due to bleeding,  anemia and hematuria however could not recall clearly.   CHA2DS2VASc score 3 due to age, HTN, DM - Follow up with [facility] upon discharge, consider anticoagulation outpatient   Moderate-severe bilateral hydronephrosis, hydroureter with  possible 8 mm distal left ureteral calculus at UVJ, urinary retention Underlying history of BPH -Foley catheter was placed, continue Flomax, added finasteride -Urology was ordered, patient was seen by Dr. Junious Silk.   - Recommended to continue Foley catheter and will need outpatient follow-up with his urologist in [facility], Dr. Thomasene Mohair.  As hydronephrosis has resolved on ultrasound, does not need repeat CT emergently however recommended may likely need to repeat CT in a week to see if stone has passed.   Generalized debility due to COVID-19  -Improving from COVID standpoint, O2 sats 94% on room air - CXR on 1/19 with no infiltrates, CRP has trended down -Patient has completed Paxlovid course x 5 days   Hypomagnesemia -Replaced IV   Acute rhabdomyolysis with acute kidney injury -Likely due to being on the floor. CK increased to 4876.  Creatinine 1.8 on 04/19/2022 -CK improved, 57, renal function improved -Continue to hold Lipitor due to transaminitis, rhabdo -Creatinine 1.1 at  discharge   Type 2 diabetes mellitus with stable proliferative retinopathy of both eyes, without long-term current use of insulin (HCC) - A1C at 8.5 Resume outpatient regimen with metformin, semaglutide, Lantus   Essential hypertension -BP stable, continue Cardizem, beta-blocker   Dyslipidemia -Lipitor was held due to rhabdomyolysis and transamnitis -Continue to hold Lipitor, follow LFTs outpatient     Iron deficiency anemia H&H stable, hemoglobin 11.3 at discharge      Sacral decubitus ulcer, stage II ([place]) -POA -Wound care per nursing     Obesity (BMI 30-39.9)  Estimated body mass index is 32.37 kg/m as calculated from the following:   Height as of this encounter: 5' 11.75" (1.822 m).   Weight as of this encounter: 107.5 kg.     Pain control - [employer] Controlled Substance Reporting System database was reviewed. and patient was instructed, not to drive, operate heavy machinery, perform  activities at heights, swimming or participation in water activities or provide baby-sitting services while on Pain, Sleep and Anxiety Medications; until their outpatient Physician has advised to do so again. Also recommended to not to take more than prescribed Pain, Sleep and Anxiety Medications.  Consultants: Cardiology, urology, general surgery, infectious disease Procedures performed: None Disposition: Home Diet recommendation:  Discharge Diet Orders (From admission, onward)     Start     Ordered      04/27/22 1400   04/27/22 0000  Diet Carb Modified        04/27/22 1400            DISCHARGE MEDICATION: Allergies as of 04/27/2022       Reactions   Amoxicillin Hives, Itching        Medication List     TAKE these medications    acetaminophen 500 MG tablet Commonly known as: TYLENOL Take 1,000 mg by mouth every 6 (six) hours as needed for moderate pain.   albuterol 108 (90 Base) MCG/ACT inhaler Commonly known as: VENTOLIN HFA Inhale 2 puffs into the lungs every 6 (six) hours as needed for wheezing or shortness of breath.   amiodarone 200 MG tablet Commonly known as: PACERONE Take 1 tab ('200mg'$ ) PO twice a day x 2 weeks. On 05/11/22, start '200mg'$  PO daily x 2 weeks, then STOP   aspirin EC 81 MG tablet Take  81 mg by mouth daily.   atorvastatin 20 MG tablet Commonly known as: Lipitor Take 1 tablet (20 mg total) by mouth daily. HOLD until liver function tests normalized What changed: additional instructions   diltiazem 240 MG 24 hr capsule Commonly known as: CARDIZEM CD Take 1 capsule (240 mg total) by mouth daily. Start taking on: April 28, 2022 What changed:  medication strength how much to take   finasteride 5 MG tablet Commonly known as: PROSCAR Take 1 tablet (5 mg total) by mouth daily. Start taking on: April 28, 2022   Fish Oil 1200 MG Caps Take 1 capsule by mouth in the morning, at noon, and at bedtime.   fluconazole 200 MG tablet Commonly  known as: DIFLUCAN Take 200 mg by mouth once a week.   ipratropium 0.03 % nasal spray Commonly known as: ATROVENT Place 2 sprays into both nostrils every 12 (twelve) hours.   Lantus SoloStar 100 UNIT/ML Solostar Pen Generic drug: insulin glargine Inject 15 Units into the skin 2 (two) times daily.   lisinopril 40 MG tablet Commonly known as: ZESTRIL Take 1 tablet by mouth daily.   metFORMIN 500 MG 24 hr tablet Commonly known as: GLUCOPHAGE-XR Take 1,000 mg by mouth 2 (two) times daily with a meal. What changed: Another medication with the same name was removed. Continue taking this medication, and follow the directions you see here.   multivitamin tablet Take 1 tablet by mouth daily.   Pen Needles 31G X 6 MM Misc Use to inject your insulin twice a day   Rybelsus 3 MG Tabs Generic drug: Semaglutide Take 3 mg by mouth daily.   tamsulosin 0.4 MG Caps capsule Commonly known as: FLOMAX Take 2 capsules (0.8 mg total) by mouth daily. What changed: how much to take   vitamin C 1000 MG tablet Take 1,000 mg by mouth daily.   Vitamin D 125 MCG (5000 UT) Caps Take 5,000 Units by mouth daily.   zinc gluconate 50 MG tablet Take 50 mg by mouth daily.               Discharge Care Instructions  (From admission, onward)           Start     Ordered   04/27/22 0000  If the dressing is still on your incision site when you go home, remove it on the third day after your surgery date. Remove dressing if it begins to fall off, or if it is dirty or damaged before the third day.        04/27/22 [place]. Follow up.   Why: Latricia Heft will follow up with you at discharge to provide home health services. Contact information: [place] [address] [phone]         Armandina Gemma, MD. Go on 05/20/2022.   Specialty: General Surgery Why: 05/20/22 at 10:15 am. Please arrive 30 minutes  early to complete check in, and bring photo ID and insurance card. Contact information: [address] [place] [id number] [phone]         [name], MD. Schedule an appointment as soon as possible for a visit in 2 week(s).   Specialty: Family Medicine Why: for hospital follow-up Contact information: [place] [address] [place],  Revonda Standard., MD. Schedule an appointment as soon as possible for a visit in 2 week(s).   Specialty: Cardiology Why: for hospital follow-up Contact information: [address] [place] [address] [phone]         Kerry Fort., MD. Schedule an appointment as soon as possible for a visit in 1 week(s).   Specialty: Urology Why: for hospital follow-up.  Please call for appointment within 1 week for VOIDING TRIAL with the Foley cathether. Contact information: [address] [place] [address] [phone]                Discharge Exam: Danley Danker Weights   04/17/22 1752 04/27/22 0458  Weight: 107.5 kg 114.1 kg   S: Very much excited to go home today.  Back in normal sinus rhythm since yesterday, no chest pain, shortness of breath, fevers.  BP (!) 154/70 (BP Location: Right Arm)   Pulse 71   Temp 98.4 F (36.9 C) (Oral)   Resp 17   Ht 5' 11.75" (1.822 m)   Wt 114.1 kg   SpO2 94%   BMI 34.35 kg/m   Physical Exam General: Alert and oriented x 3, NAD Cardiovascular: S1 S2 clear, RRR.  Respiratory: CTAB, no wheezing, rales or rhonchi Gastrointestinal: Soft, nontender, nondistended, NBS Ext: no pedal edema bilaterally Neuro: no new deficits Skin: No rashes Psych: Normal affect and demeanor, alert and oriented x3    Condition at discharge: fair  The results of significant diagnostics from this hospitalization (including imaging, microbiology, ancillary and laboratory) are listed below for reference.   Imaging  Studies: US Abdomen Complete  Result Date: 04/23/2022 CLINICAL DATA:  Abdominal pain.  Abnormal gallbladder EXAM: ABDOMEN ULTRASOUND COMPLETE COMPARISON:  Same day CT FINDINGS: Gallbladder: Abnormal appearance of the gallbladder with wall-echo-shadow sign secondary to large intraluminal stone or stones. Degree of shadowing largely obscures evaluation of the gallbladder. The visualized portion of the gallbladder wall is not thickened. No sonographic Percell Miller sign was reported. Common bile duct: Diameter: 7 mm. Liver: No focal lesion identified. Mildly increased parenchymal echogenicity. Portal vein is patent on color Doppler imaging with normal direction of blood flow towards the liver. IVC: No abnormality visualized. Pancreas: Visualized portion unremarkable. Spleen: Size and appearance within normal limits. Right Kidney: Length: 12.9 cm. Echogenicity within normal limits. 5.4 cm simple cyst at the upper pole of the right kidney. No follow-up imaging with recommended. No solid mass or hydronephrosis visualized. Left Kidney: Length: 12.4 cm. Echogenicity within normal limits. No mass or hydronephrosis visualized. Abdominal aorta: No aneurysm visualized. Other findings: None. IMPRESSION: 1. Abnormal appearance of the gallbladder with wall-echo-shadow sign secondary to large intraluminal stone or stones. Degree of shadowing largely obscures evaluation of the gallbladder. The visualized portion of the gallbladder wall is not thickened. No sonographic Percell Miller sign was reported. 2. Mildly increased echogenicity of the liver. This is a nonspecific finding but is most commonly seen with fatty infiltration of the liver. There are no obvious focal liver lesions. 3. The common bile duct is upper limits of normal in caliber. Electronically Signed   By: Davina Poke D.O.   On: 04/23/2022 15:34   CT ABDOMEN PELVIS W CONTRAST  Addendum Date: 04/23/2022   ADDENDUM REPORT: 04/23/2022 12:04 ADDENDUM: This case was discussed  with Surgery PA midway through this morning. She advises the patient is positive for COVID-19, has no right upper quadrant symptoms, is tolerating p.o. normally, currently afebrile, but with highly abnormal urine culture. We  discussed these liver and gallbladder findings, which now seem UNLIKELY to be acute cholecystitis and liver abscess given this clinical setting. Unusual gallstone is favored over gallbladder tumor. We discussed that further evaluation with Right Upper Quadrant Ultrasound (including Doppler of the gallbladder contents), which will likely be more successful than Abdomen MRI in the inpatient setting. Ultrasound is pending at this time. Electronically Signed   By: Genevie Ann M.D.   On: 04/23/2022 12:04   Result Date: 04/23/2022 CLINICAL DATA:  77 year old male with abdominal pain. Infection. Recent fever and fall. EXAM: CT ABDOMEN AND PELVIS WITH CONTRAST TECHNIQUE: Multidetector CT imaging of the abdomen and pelvis was performed using the standard protocol following bolus administration of intravenous contrast. RADIATION DOSE REDUCTION: This exam was performed according to the departmental dose-optimization program which includes automated exposure control, adjustment of the mA and/or kV according to patient size and/or use of iterative reconstruction technique. CONTRAST:  1103m OMNIPAQUE IOHEXOL 300 MG/ML  SOLN COMPARISON:  Portable chest 04/17/2022. FINDINGS: Lower chest: Small bilateral layering pleural effusions, greater on the right. No pericardial effusion. Heart size appears to remain normal. Superimposed confluent bilateral lower lobe opacity, with some air bronchograms and consolidation more so on the right. This is beyond that typical of atelectasis. Hepatobiliary: Highly abnormal gallbladder. The gallbladder wall appears indistinct, and there is a large mixed density nearly 5 cm calculus or mass within the lumen (coronal image 54). No hyperenhancement at the gallbladder fossa. However,  along the medial anterior fossa there is an indistinct and somewhat bilobed appearing 18 mm hypodense liver lesion. See coronal image 54 and series 2, image 23. No gas within the liver. No superimposed bile duct enlargement. Pancreas: Atrophied. Spleen: Negative. Adrenals/Urinary Tract: Adrenal glands remain normal. Moderate bilateral hydronephrosis and hydroureter with extensive bilateral pararenal space edema or inflammatory stranding. Small calculus within the left renal lower pole. Punctate right lower pole stone. Exophytic bilateral renal cysts with simple fluid density appear benign (no follow-up imaging recommended). On delayed phase images there is fairly symmetric renal contrast enhancement. The right ureter is dilated to the ureterovesical junction with no filling defect identified. Left ureter similarly dilated to the UVJ, although there is an 8 mm calculus either at the ureterovesical junction or adjacent to it. See series 2, image 87 and coronal image 68. Furthermore, the bladder is distended with an estimated bladder volume of 1800 mL. Stomach/Bowel: Oral contrast in the decompressed rectum and sigmoid colon. Mild sigmoid diverticulosis. Oral contrast throughout the more proximal large bowel, with redundant hepatic flexure and cecum located on a lax mesentery in the right upper quadrant. However, no large bowel inflammation identified. Normal contrast containing appendix on series 2, image 40 which tracks laterally and inferiorly. Decompressed terminal ileum and no dilated small bowel. Decompressed stomach and duodenum. No free air. Vascular/Lymphatic: Normal caliber abdominal aorta. Major arterial structures appear to remain patent. Mild-to-moderate Aortoiliac calcified atherosclerosis. Portal venous system appears to be patent. No lymphadenopathy identified. Reproductive: Dilated prostatic urethra, questionable previous tear (series 2, image 96). Superimposed bladder distension as above. Superimposed  heterogeneous prostate hypertrophy. Otherwise negative. Other: Nonspecific trace presacral stranding. No layering pelvic free fluid. Musculoskeletal: Advanced lumbar disc and endplate degeneration. No acute or suspicious osseous lesion. Nonspecific bilateral body wall edema, especially at the dependent flanks. Trace ventral right lower abdominal wall subcutaneous gas, likely an injection site. IMPRESSION: 1. Highly abnormal gallbladder, with a large mixed density 5 cm calculus or mass within the lumen, and indistinct gallbladder wall. Adjacent bilobed  1.8 cm hypodense liver lesion along the medial anterior gallbladder fossa is nonspecific. Cannot exclude Cholecystitis with an adjacent small Liver Abscess. 2. Superimposed moderate to severe bilateral hydronephrosis and hydroureter with a dilated, 1800 mL Bladder. Consider Bladder Outlet Obstruction (note prostate hypertrophy with dilated prostatic urethra) versus Urinary Retention. However, difficult to exclude an 8 mm distal left ureteral calculus at the UVJ (series 2, image 87). Mild underlying nephrolithiasis. 3. Small bilateral layering pleural effusions, greater on the right, with confluent bilateral lower lobe opacity which could be Pneumonia or severe atelectasis. 4. Body wall edema.  Aortic Atherosclerosis (ICD10-I70.0). Electronically Signed: By: Genevie Ann M.D. On: 04/23/2022 06:47   DG Chest Port 1 View  Result Date: 04/17/2022 CLINICAL DATA:  Fever and fall. EXAM: PORTABLE CHEST 1 VIEW COMPARISON:  Chest radiograph dated 01/31/2020. FINDINGS: Bibasilar dependent atelectasis. No focal consolidation, pleural effusion, or pneumothorax. The cardiac silhouette is within normal limits. No acute osseous pathology. IMPRESSION: No active disease. Electronically Signed   By: Anner Crete M.D.   On: 04/17/2022 19:19   DG Pelvis Portable  Result Date: 04/17/2022 CLINICAL DATA:  Fever.  Fall.  Weakness for 2 days EXAM: PORTABLE PELVIS 2 VIEWS COMPARISON:   None FINDINGS: Gas is seen in nondilated loops of small and large bowel. Mild degenerative changes seen of the lower lumbar spine. Of the pelvis there is no fracture or dislocation. Preserved joint spaces and bone mineralization. Only mild sclerosis of the sacroiliac joint on the left. Under penetrated radiographs. IMPRESSION: Mild degenerative changes.  No acute osseous abnormality. Electronically Signed   By: Jill Side M.D.   On: 04/17/2022 19:13    Microbiology: Results for orders placed or performed during the hospital encounter of 04/17/22  Blood culture (routine x 2)     Status: Abnormal   Collection Time: 04/17/22  5:56 PM   Specimen: BLOOD  Result Value Ref Range Status   Specimen Description   Final    BLOOD BLOOD RIGHT WRIST Performed at [facility] [address], [place], [place] [address]    Special Requests   Final    BOTTLES DRAWN AEROBIC AND ANAEROBIC Blood Culture results may not be optimal due to an inadequate volume of blood received in culture bottles Performed at [facility] [address], [place], [place] [address]    Culture  Setup Time   Final    GRAM NEGATIVE RODS AEROBIC BOTTLE ONLY CRITICAL RESULT CALLED TO, READ BACK BY AND VERIFIED WITH: L POINDEXTER,PHARMD'@0458'$  04/19/22 [place] Performed at [facility], [place] [address] [place], [place] [address]    Culture KLEBSIELLA PNEUMONIAE (A)  Final   Report Status 04/21/2022 FINAL  Final   Organism ID, Bacteria KLEBSIELLA PNEUMONIAE  Final      Susceptibility   Klebsiella pneumoniae - MIC*    AMPICILLIN >=32 RESISTANT Resistant     CEFAZOLIN <=4 SENSITIVE Sensitive     CEFEPIME <=0.12 SENSITIVE Sensitive     CEFTAZIDIME <=1 SENSITIVE Sensitive     CEFTRIAXONE <=0.25 SENSITIVE Sensitive     CIPROFLOXACIN <=0.25 SENSITIVE Sensitive     GENTAMICIN <=1 SENSITIVE Sensitive     IMIPENEM <=0.25 SENSITIVE Sensitive     TRIMETH/SULFA <=20 SENSITIVE Sensitive      AMPICILLIN/SULBACTAM 8 SENSITIVE Sensitive     PIP/TAZO <=4 SENSITIVE Sensitive     * KLEBSIELLA PNEUMONIAE  Blood Culture ID Panel (Reflexed)     Status: Abnormal   Collection Time: 04/17/22  5:56  PM  Result Value Ref Range Status   Enterococcus faecalis NOT DETECTED NOT DETECTED Final   Enterococcus Faecium NOT DETECTED NOT DETECTED Final   Listeria monocytogenes NOT DETECTED NOT DETECTED Final   Staphylococcus species NOT DETECTED NOT DETECTED Final   Staphylococcus aureus (BCID) NOT DETECTED NOT DETECTED Final   Staphylococcus epidermidis NOT DETECTED NOT DETECTED Final   Staphylococcus lugdunensis NOT DETECTED NOT DETECTED Final   Streptococcus species NOT DETECTED NOT DETECTED Final   Streptococcus agalactiae NOT DETECTED NOT DETECTED Final   Streptococcus pneumoniae NOT DETECTED NOT DETECTED Final   Streptococcus pyogenes NOT DETECTED NOT DETECTED Final   A.calcoaceticus-baumannii NOT DETECTED NOT DETECTED Final   Bacteroides fragilis NOT DETECTED NOT DETECTED Final   Enterobacterales DETECTED (A) NOT DETECTED Final    Comment: Enterobacterales represent a large order of gram negative bacteria, not a single organism. CRITICAL RESULT CALLED TO, READ BACK BY AND VERIFIED WITH: L POINDEXTER,PHARMD'@0458'$  04/19/22 [place]    Enterobacter cloacae complex NOT DETECTED NOT DETECTED Final   Escherichia coli NOT DETECTED NOT DETECTED Final   Klebsiella aerogenes NOT DETECTED NOT DETECTED Final   Klebsiella oxytoca NOT DETECTED NOT DETECTED Final   Klebsiella pneumoniae DETECTED (A) NOT DETECTED Final    Comment: CRITICAL RESULT CALLED TO, READ BACK BY AND VERIFIED WITH: L POINDEXTER,PHARMD'@0458'$  04/19/22 [place]    Proteus species NOT DETECTED NOT DETECTED Final   Salmonella species NOT DETECTED NOT DETECTED Final   Serratia marcescens NOT DETECTED NOT DETECTED Final   Haemophilus influenzae NOT DETECTED NOT DETECTED Final   Neisseria meningitidis NOT DETECTED NOT DETECTED Final   Pseudomonas  aeruginosa NOT DETECTED NOT DETECTED Final   Stenotrophomonas maltophilia NOT DETECTED NOT DETECTED Final   Candida albicans NOT DETECTED NOT DETECTED Final   Candida auris NOT DETECTED NOT DETECTED Final   Candida glabrata NOT DETECTED NOT DETECTED Final   Candida krusei NOT DETECTED NOT DETECTED Final   Candida parapsilosis NOT DETECTED NOT DETECTED Final   Candida tropicalis NOT DETECTED NOT DETECTED Final   Cryptococcus neoformans/gattii NOT DETECTED NOT DETECTED Final   CTX-M ESBL NOT DETECTED NOT DETECTED Final   Carbapenem resistance IMP NOT DETECTED NOT DETECTED Final   Carbapenem resistance KPC NOT DETECTED NOT DETECTED Final   Carbapenem resistance NDM NOT DETECTED NOT DETECTED Final   Carbapenem resist OXA 48 LIKE NOT DETECTED NOT DETECTED Final   Carbapenem resistance VIM NOT DETECTED NOT DETECTED Final    Comment: Performed at [facility], [address] [place], [place] [address]  Blood culture (routine x 2)     Status: Abnormal   Collection Time: 04/17/22  6:01 PM   Specimen: BLOOD  Result Value Ref Range Status   Specimen Description   Final    BLOOD BLOOD LEFT FOREARM Performed at [facility] [address], [place], [place] [address]    Special Requests   Final    BOTTLES DRAWN AEROBIC AND ANAEROBIC Blood Culture results may not be optimal due to an inadequate volume of blood received in culture bottles Performed at [facility] [address], [place], [place] [address]    Culture  Setup Time   Final    GRAM NEGATIVE RODS AEROBIC BOTTLE ONLY CRITICAL VALUE NOTED.  VALUE IS CONSISTENT WITH PREVIOUSLY REPORTED AND CALLED VALUE.    Culture (A)  Final    KLEBSIELLA PNEUMONIAE SUSCEPTIBILITIES PERFORMED ON PREVIOUS CULTURE WITHIN THE LAST 5 DAYS. Performed at [facility], [place]  [address] [place], [place] [address]    Report Status 04/21/2022 FINAL  Final  Resp panel by RT-PCR (RSV, Flu A&B, Covid) Anterior  Nasal Swab     Status: Abnormal   Collection Time: 04/17/22  6:01 PM   Specimen: Anterior Nasal Swab  Result Value Ref Range Status   SARS Coronavirus 2 by RT PCR POSITIVE (A) NEGATIVE Final    Comment: (NOTE) SARS-CoV-2 target nucleic acids are DETECTED.  The SARS-CoV-2 RNA is generally detectable in upper respiratory specimens during the acute phase of infection. Positive results are indicative of the presence of the identified virus, but do not rule out bacterial infection or co-infection with other pathogens not detected by the test. Clinical correlation with patient history and other diagnostic information is necessary to determine patient infection status. The expected result is Negative.  Fact Sheet for Patients: EntrepreneurPulse.com.au  Fact Sheet for Healthcare Providers: IncredibleEmployment.be  This test is not yet approved or cleared by the [place] FDA and  has been authorized for detection and/or diagnosis of SARS-CoV-2 by FDA under an Emergency Use Authorization (EUA).  This EUA will remain in effect (meaning this test can be used) for the duration of  the COVID-19 declaration under Section 564(b)(1) of the A ct, 21 U.S.C. section 360bbb-3(b)(1), unless the authorization is terminated or revoked sooner.     Influenza A by PCR NEGATIVE NEGATIVE Final   Influenza B by PCR NEGATIVE NEGATIVE Final    Comment: (NOTE) The Xpert Xpress SARS-CoV-2/FLU/RSV plus assay is intended as an aid in the diagnosis of influenza from Nasopharyngeal swab specimens and should not be used as a sole basis for treatment. Nasal washings and aspirates are unacceptable for Xpert Xpress SARS-CoV-2/FLU/RSV testing.  Fact Sheet for Patients: EntrepreneurPulse.com.au  Fact Sheet for Healthcare Providers: IncredibleEmployment.be  This test is not yet approved or cleared by the [place] FDA and has been  authorized for detection and/or diagnosis of SARS-CoV-2 by FDA under an Emergency Use Authorization (EUA). This EUA will remain in effect (meaning this test can be used) for the duration of the COVID-19 declaration under Section 564(b)(1) of the Act, 21 U.S.C. section 360bbb-3(b)(1), unless the authorization is terminated or revoked.     Resp Syncytial Virus by PCR NEGATIVE NEGATIVE Final    Comment: (NOTE) Fact Sheet for Patients: EntrepreneurPulse.com.au  Fact Sheet for Healthcare Providers: IncredibleEmployment.be  This test is not yet approved or cleared by the [place] FDA and has been authorized for detection and/or diagnosis of SARS-CoV-2 by FDA under an Emergency Use Authorization (EUA). This EUA will remain in effect (meaning this test can be used) for the duration of the COVID-19 declaration under Section 564(b)(1) of the Act, 21 U.S.C. section 360bbb-3(b)(1), unless the authorization is terminated or revoked.  Performed at [facility], [place] [address], [place], [place] [address]   Urine Culture     Status: Abnormal   Collection Time: 04/18/22  3:14 AM   Specimen: Urine, Clean Catch  Result Value Ref Range Status   Specimen Description   Final    URINE, CLEAN CATCH Performed at [facility], [place] [address], [place], [place] [address]    Special Requests   Final    NONE Performed at [facility], [place] [address] [place], [place] [address]    Culture 70,000 COLONIES/mL STAPHYLOCOCCUS AUREUS (A)  Final   Report Status 04/20/2022 FINAL  Final   Organism ID, Bacteria STAPHYLOCOCCUS AUREUS (A)  Final  Susceptibility   Staphylococcus aureus - MIC*    CIPROFLOXACIN <=0.5 SENSITIVE Sensitive     GENTAMICIN <=0.5 SENSITIVE Sensitive     NITROFURANTOIN <=16 SENSITIVE Sensitive     OXACILLIN <=0.25 SENSITIVE Sensitive     TETRACYCLINE <=1 SENSITIVE Sensitive      VANCOMYCIN 1 SENSITIVE Sensitive     TRIMETH/SULFA <=10 SENSITIVE Sensitive     CLINDAMYCIN <=0.25 SENSITIVE Sensitive     RIFAMPIN <=0.5 SENSITIVE Sensitive     Inducible Clindamycin NEGATIVE Sensitive     * 70,000 COLONIES/mL STAPHYLOCOCCUS AUREUS  Culture, blood (Routine X 2) w Reflex to ID Panel     Status: None   Collection Time: 04/22/22 12:19 PM   Specimen: BLOOD  Result Value Ref Range Status   Specimen Description   Final    BLOOD BLOOD RIGHT HAND Performed at [facility] [address], [place], [place] [address]    Special Requests   Final    BOTTLES DRAWN AEROBIC AND ANAEROBIC Blood Culture adequate volume Performed at [facility] [address], [place], [place] [address]    Culture   Final    NO GROWTH 5 DAYS Performed at [facility], [place] [address], [place], [place] [address]    Report Status 04/27/2022 FINAL  Final  Culture, blood (Routine X 2) w Reflex to ID Panel     Status: None   Collection Time: 04/22/22 12:39 PM   Specimen: BLOOD  Result Value Ref Range Status   Specimen Description   Final    BLOOD BLOOD LEFT ARM Performed at [facility] [address] [place], [place] [address]    Special Requests   Final    BOTTLES DRAWN AEROBIC AND ANAEROBIC Blood Culture adequate volume Performed at [facility] [address], [place], [place] [address]    Culture   Final    NO GROWTH 5 DAYS Performed at [facility], [place] [address], [place], [place] [address]    Report Status 04/27/2022 FINAL  Final    Labs: CBC: Recent Labs  Lab 04/21/22 0547 04/22/22 0605 04/23/22 0624 04/24/22 0535 04/25/22 0715  WBC 9.2 10.9* 11.8* 8.7 8.3  NEUTROABS 7.7 9.2* 10.0* 6.5 5.7  HGB 11.7* 11.7* 11.1* 10.7* 11.3*  HCT 34.9* 35.2* 33.4* 32.2* 33.8*  MCV 87.9 88.2 87.7 87.3 87.8  PLT 145* 160 209 249 662   Basic Metabolic Panel: Recent Labs  Lab 04/22/22 0605  04/23/22 0624 04/24/22 0535 04/25/22 0715 04/26/22 0527 04/27/22 0442  NA 134* 136 140 135  --  136  K 3.6 3.6 3.4* 3.4*  --  3.9  CL 103 105 107 102  --  103  CO2 20* 18* 25 25  --  26  GLUCOSE 155* 156* 143* 213*  --  96  BUN '23 23 15 13  '$ --  14  CREATININE 1.35* 1.17 0.95 1.01  --  1.13  CALCIUM 8.2* 8.1* 8.3* 8.0*  --  8.1*  MG 1.8 1.7 1.8 1.7 1.6* 2.2  PHOS 2.4* 2.7 3.4 3.4 3.1  --    Liver Function Tests: Recent Labs  Lab 04/22/22 0605 04/23/22 0624 04/24/22 0535 04/25/22 0715 04/27/22 0442  AST 97* 97* 72* 52* 39  ALT 65* 83* 75* 67* 50*  ALKPHOS 53 62 57 55 51  BILITOT 0.8 0.8 0.4 0.4 0.4  PROT 6.1* 6.0* 5.5* 5.5* 5.2*  ALBUMIN 2.5* 2.6* 2.3* 2.3* 2.2*  CBG: Recent Labs  Lab 04/26/22 1137 04/26/22 1651 04/26/22 2012 04/27/22 0744 04/27/22 1203  GLUCAP 273* 154* 167* 101* 248*    Discharge time spent: greater than 30 minutes.  Signed: Estill Cotta, MD [facility] 04/27/2022

## 2022-04-30 DIAGNOSIS — D6869 Other thrombophilia: Secondary | ICD-10-CM | POA: Diagnosis not present

## 2022-04-30 DIAGNOSIS — U071 COVID-19: Secondary | ICD-10-CM | POA: Diagnosis not present

## 2022-04-30 DIAGNOSIS — R6520 Severe sepsis without septic shock: Secondary | ICD-10-CM | POA: Diagnosis not present

## 2022-04-30 DIAGNOSIS — N182 Chronic kidney disease, stage 2 (mild): Secondary | ICD-10-CM | POA: Diagnosis not present

## 2022-04-30 DIAGNOSIS — E785 Hyperlipidemia, unspecified: Secondary | ICD-10-CM | POA: Diagnosis not present

## 2022-04-30 DIAGNOSIS — T796XXD Traumatic ischemia of muscle, subsequent encounter: Secondary | ICD-10-CM | POA: Diagnosis not present

## 2022-04-30 DIAGNOSIS — N401 Enlarged prostate with lower urinary tract symptoms: Secondary | ICD-10-CM | POA: Diagnosis not present

## 2022-04-30 DIAGNOSIS — I480 Paroxysmal atrial fibrillation: Secondary | ICD-10-CM | POA: Diagnosis not present

## 2022-05-01 DIAGNOSIS — N182 Chronic kidney disease, stage 2 (mild): Secondary | ICD-10-CM | POA: Diagnosis not present

## 2022-05-01 DIAGNOSIS — I10 Essential (primary) hypertension: Secondary | ICD-10-CM | POA: Diagnosis not present

## 2022-05-01 DIAGNOSIS — E1169 Type 2 diabetes mellitus with other specified complication: Secondary | ICD-10-CM | POA: Diagnosis not present

## 2022-05-01 DIAGNOSIS — E785 Hyperlipidemia, unspecified: Secondary | ICD-10-CM | POA: Diagnosis not present

## 2022-05-05 DIAGNOSIS — N401 Enlarged prostate with lower urinary tract symptoms: Secondary | ICD-10-CM | POA: Diagnosis not present

## 2022-05-05 DIAGNOSIS — R338 Other retention of urine: Secondary | ICD-10-CM | POA: Diagnosis not present

## 2022-05-05 DIAGNOSIS — N138 Other obstructive and reflux uropathy: Secondary | ICD-10-CM | POA: Diagnosis not present

## 2022-05-07 DIAGNOSIS — D72829 Elevated white blood cell count, unspecified: Secondary | ICD-10-CM | POA: Diagnosis not present

## 2022-05-12 DIAGNOSIS — E113553 Type 2 diabetes mellitus with stable proliferative diabetic retinopathy, bilateral: Secondary | ICD-10-CM | POA: Diagnosis not present

## 2022-05-12 DIAGNOSIS — R338 Other retention of urine: Secondary | ICD-10-CM | POA: Diagnosis not present

## 2022-05-12 DIAGNOSIS — I480 Paroxysmal atrial fibrillation: Secondary | ICD-10-CM | POA: Diagnosis not present

## 2022-05-12 DIAGNOSIS — I10 Essential (primary) hypertension: Secondary | ICD-10-CM | POA: Diagnosis not present

## 2022-05-12 DIAGNOSIS — N138 Other obstructive and reflux uropathy: Secondary | ICD-10-CM | POA: Diagnosis not present

## 2022-05-12 DIAGNOSIS — I4891 Unspecified atrial fibrillation: Secondary | ICD-10-CM | POA: Diagnosis not present

## 2022-05-12 DIAGNOSIS — N401 Enlarged prostate with lower urinary tract symptoms: Secondary | ICD-10-CM | POA: Diagnosis not present

## 2022-05-12 DIAGNOSIS — N3000 Acute cystitis without hematuria: Secondary | ICD-10-CM | POA: Diagnosis not present

## 2022-05-13 DIAGNOSIS — I480 Paroxysmal atrial fibrillation: Secondary | ICD-10-CM | POA: Diagnosis not present

## 2022-05-19 DIAGNOSIS — R8290 Unspecified abnormal findings in urine: Secondary | ICD-10-CM | POA: Diagnosis not present

## 2022-05-19 DIAGNOSIS — N138 Other obstructive and reflux uropathy: Secondary | ICD-10-CM | POA: Diagnosis not present

## 2022-05-19 DIAGNOSIS — N3000 Acute cystitis without hematuria: Secondary | ICD-10-CM | POA: Diagnosis not present

## 2022-05-19 DIAGNOSIS — N201 Calculus of ureter: Secondary | ICD-10-CM | POA: Diagnosis not present

## 2022-05-19 DIAGNOSIS — N401 Enlarged prostate with lower urinary tract symptoms: Secondary | ICD-10-CM | POA: Diagnosis not present

## 2022-05-20 DIAGNOSIS — K8020 Calculus of gallbladder without cholecystitis without obstruction: Secondary | ICD-10-CM | POA: Diagnosis not present

## 2022-05-28 DIAGNOSIS — N281 Cyst of kidney, acquired: Secondary | ICD-10-CM | POA: Diagnosis not present

## 2022-05-28 DIAGNOSIS — N201 Calculus of ureter: Secondary | ICD-10-CM | POA: Diagnosis not present

## 2022-05-28 DIAGNOSIS — N3289 Other specified disorders of bladder: Secondary | ICD-10-CM | POA: Diagnosis not present

## 2022-05-28 DIAGNOSIS — K5730 Diverticulosis of large intestine without perforation or abscess without bleeding: Secondary | ICD-10-CM | POA: Diagnosis not present

## 2022-05-28 DIAGNOSIS — N202 Calculus of kidney with calculus of ureter: Secondary | ICD-10-CM | POA: Diagnosis not present

## 2022-05-28 DIAGNOSIS — R8290 Unspecified abnormal findings in urine: Secondary | ICD-10-CM | POA: Diagnosis not present

## 2022-05-28 DIAGNOSIS — N132 Hydronephrosis with renal and ureteral calculous obstruction: Secondary | ICD-10-CM | POA: Diagnosis not present

## 2022-05-28 DIAGNOSIS — R338 Other retention of urine: Secondary | ICD-10-CM | POA: Diagnosis not present

## 2022-05-28 DIAGNOSIS — N3000 Acute cystitis without hematuria: Secondary | ICD-10-CM | POA: Diagnosis not present

## 2022-05-28 DIAGNOSIS — N200 Calculus of kidney: Secondary | ICD-10-CM | POA: Diagnosis not present

## 2022-06-05 DIAGNOSIS — K8020 Calculus of gallbladder without cholecystitis without obstruction: Secondary | ICD-10-CM | POA: Diagnosis not present

## 2022-06-10 DIAGNOSIS — R3916 Straining to void: Secondary | ICD-10-CM | POA: Diagnosis not present

## 2022-06-10 DIAGNOSIS — N401 Enlarged prostate with lower urinary tract symptoms: Secondary | ICD-10-CM | POA: Diagnosis not present

## 2022-06-10 DIAGNOSIS — N3289 Other specified disorders of bladder: Secondary | ICD-10-CM | POA: Diagnosis not present

## 2022-06-10 DIAGNOSIS — Z880 Allergy status to penicillin: Secondary | ICD-10-CM | POA: Diagnosis not present

## 2022-06-10 DIAGNOSIS — R338 Other retention of urine: Secondary | ICD-10-CM | POA: Diagnosis not present

## 2022-06-10 DIAGNOSIS — I10 Essential (primary) hypertension: Secondary | ICD-10-CM | POA: Diagnosis not present

## 2022-06-10 DIAGNOSIS — Z7984 Long term (current) use of oral hypoglycemic drugs: Secondary | ICD-10-CM | POA: Diagnosis not present

## 2022-06-10 DIAGNOSIS — Z794 Long term (current) use of insulin: Secondary | ICD-10-CM | POA: Diagnosis not present

## 2022-06-10 DIAGNOSIS — Z7982 Long term (current) use of aspirin: Secondary | ICD-10-CM | POA: Diagnosis not present

## 2022-06-10 DIAGNOSIS — E113553 Type 2 diabetes mellitus with stable proliferative diabetic retinopathy, bilateral: Secondary | ICD-10-CM | POA: Diagnosis not present

## 2022-06-10 DIAGNOSIS — Z79899 Other long term (current) drug therapy: Secondary | ICD-10-CM | POA: Diagnosis not present

## 2022-06-10 DIAGNOSIS — R3912 Poor urinary stream: Secondary | ICD-10-CM | POA: Diagnosis not present

## 2022-06-10 DIAGNOSIS — N201 Calculus of ureter: Secondary | ICD-10-CM | POA: Diagnosis not present

## 2022-06-10 DIAGNOSIS — N179 Acute kidney failure, unspecified: Secondary | ICD-10-CM | POA: Diagnosis not present

## 2022-06-10 DIAGNOSIS — N138 Other obstructive and reflux uropathy: Secondary | ICD-10-CM | POA: Diagnosis not present

## 2022-06-10 DIAGNOSIS — I4891 Unspecified atrial fibrillation: Secondary | ICD-10-CM | POA: Diagnosis not present

## 2022-06-10 DIAGNOSIS — N132 Hydronephrosis with renal and ureteral calculous obstruction: Secondary | ICD-10-CM | POA: Diagnosis not present

## 2022-06-13 DIAGNOSIS — I480 Paroxysmal atrial fibrillation: Secondary | ICD-10-CM | POA: Diagnosis not present

## 2022-06-16 DIAGNOSIS — I480 Paroxysmal atrial fibrillation: Secondary | ICD-10-CM | POA: Diagnosis not present

## 2022-06-19 DIAGNOSIS — N201 Calculus of ureter: Secondary | ICD-10-CM | POA: Diagnosis not present

## 2022-06-19 DIAGNOSIS — Z09 Encounter for follow-up examination after completed treatment for conditions other than malignant neoplasm: Secondary | ICD-10-CM | POA: Diagnosis not present

## 2022-06-19 DIAGNOSIS — N400 Enlarged prostate without lower urinary tract symptoms: Secondary | ICD-10-CM | POA: Diagnosis not present

## 2022-06-19 DIAGNOSIS — R82998 Other abnormal findings in urine: Secondary | ICD-10-CM | POA: Diagnosis not present

## 2022-07-02 ENCOUNTER — Other Ambulatory Visit: Payer: Self-pay | Admitting: Family Medicine

## 2022-07-02 DIAGNOSIS — I6523 Occlusion and stenosis of bilateral carotid arteries: Secondary | ICD-10-CM

## 2022-07-13 DIAGNOSIS — I480 Paroxysmal atrial fibrillation: Secondary | ICD-10-CM | POA: Diagnosis not present

## 2022-07-13 DIAGNOSIS — R809 Proteinuria, unspecified: Secondary | ICD-10-CM | POA: Diagnosis not present

## 2022-07-13 DIAGNOSIS — E1122 Type 2 diabetes mellitus with diabetic chronic kidney disease: Secondary | ICD-10-CM | POA: Diagnosis not present

## 2022-07-13 DIAGNOSIS — N182 Chronic kidney disease, stage 2 (mild): Secondary | ICD-10-CM | POA: Diagnosis not present

## 2022-07-13 DIAGNOSIS — N400 Enlarged prostate without lower urinary tract symptoms: Secondary | ICD-10-CM | POA: Diagnosis not present

## 2022-07-13 DIAGNOSIS — I129 Hypertensive chronic kidney disease with stage 1 through stage 4 chronic kidney disease, or unspecified chronic kidney disease: Secondary | ICD-10-CM | POA: Diagnosis not present

## 2022-07-21 DIAGNOSIS — H40053 Ocular hypertension, bilateral: Secondary | ICD-10-CM | POA: Diagnosis not present

## 2022-07-21 DIAGNOSIS — I10 Essential (primary) hypertension: Secondary | ICD-10-CM | POA: Diagnosis not present

## 2022-07-21 DIAGNOSIS — Z8669 Personal history of other diseases of the nervous system and sense organs: Secondary | ICD-10-CM | POA: Diagnosis not present

## 2022-07-21 DIAGNOSIS — E119 Type 2 diabetes mellitus without complications: Secondary | ICD-10-CM | POA: Diagnosis not present

## 2022-07-21 DIAGNOSIS — E1169 Type 2 diabetes mellitus with other specified complication: Secondary | ICD-10-CM | POA: Diagnosis not present

## 2022-07-21 DIAGNOSIS — H59812 Chorioretinal scars after surgery for detachment, left eye: Secondary | ICD-10-CM | POA: Diagnosis not present

## 2022-07-21 DIAGNOSIS — D3131 Benign neoplasm of right choroid: Secondary | ICD-10-CM | POA: Diagnosis not present

## 2022-07-21 DIAGNOSIS — E785 Hyperlipidemia, unspecified: Secondary | ICD-10-CM | POA: Diagnosis not present

## 2022-07-23 ENCOUNTER — Ambulatory Visit
Admission: RE | Admit: 2022-07-23 | Discharge: 2022-07-23 | Disposition: A | Discharge status: Home / Self Care | Payer: PPO | Source: Ambulatory Visit | Attending: Family Medicine | Admitting: Family Medicine

## 2022-07-23 DIAGNOSIS — I10 Essential (primary) hypertension: Secondary | ICD-10-CM | POA: Diagnosis not present

## 2022-07-23 DIAGNOSIS — E119 Type 2 diabetes mellitus without complications: Secondary | ICD-10-CM | POA: Diagnosis not present

## 2022-07-23 DIAGNOSIS — I6523 Occlusion and stenosis of bilateral carotid arteries: Secondary | ICD-10-CM

## 2022-07-23 DIAGNOSIS — E785 Hyperlipidemia, unspecified: Secondary | ICD-10-CM | POA: Diagnosis not present

## 2022-08-10 DIAGNOSIS — Z794 Long term (current) use of insulin: Secondary | ICD-10-CM | POA: Diagnosis not present

## 2022-08-10 DIAGNOSIS — I6521 Occlusion and stenosis of right carotid artery: Secondary | ICD-10-CM | POA: Diagnosis not present

## 2022-08-10 DIAGNOSIS — N182 Chronic kidney disease, stage 2 (mild): Secondary | ICD-10-CM | POA: Diagnosis not present

## 2022-08-10 DIAGNOSIS — E785 Hyperlipidemia, unspecified: Secondary | ICD-10-CM | POA: Diagnosis not present

## 2022-08-10 DIAGNOSIS — E1122 Type 2 diabetes mellitus with diabetic chronic kidney disease: Secondary | ICD-10-CM | POA: Diagnosis not present

## 2022-08-10 DIAGNOSIS — E1169 Type 2 diabetes mellitus with other specified complication: Secondary | ICD-10-CM | POA: Diagnosis not present

## 2022-08-10 DIAGNOSIS — M5136 Other intervertebral disc degeneration, lumbar region: Secondary | ICD-10-CM | POA: Diagnosis not present

## 2022-08-10 DIAGNOSIS — I10 Essential (primary) hypertension: Secondary | ICD-10-CM | POA: Diagnosis not present

## 2022-08-10 DIAGNOSIS — R29818 Other symptoms and signs involving the nervous system: Secondary | ICD-10-CM | POA: Diagnosis not present

## 2022-08-10 DIAGNOSIS — D6869 Other thrombophilia: Secondary | ICD-10-CM | POA: Diagnosis not present

## 2022-08-10 DIAGNOSIS — I480 Paroxysmal atrial fibrillation: Secondary | ICD-10-CM | POA: Diagnosis not present

## 2022-08-11 DIAGNOSIS — I10 Essential (primary) hypertension: Secondary | ICD-10-CM | POA: Diagnosis not present

## 2022-08-11 DIAGNOSIS — I480 Paroxysmal atrial fibrillation: Secondary | ICD-10-CM | POA: Diagnosis not present

## 2022-08-11 DIAGNOSIS — Z6832 Body mass index (BMI) 32.0-32.9, adult: Secondary | ICD-10-CM | POA: Diagnosis not present

## 2022-08-11 DIAGNOSIS — E785 Hyperlipidemia, unspecified: Secondary | ICD-10-CM | POA: Diagnosis not present

## 2022-08-11 DIAGNOSIS — I440 Atrioventricular block, first degree: Secondary | ICD-10-CM | POA: Diagnosis not present

## 2022-08-11 DIAGNOSIS — Z79899 Other long term (current) drug therapy: Secondary | ICD-10-CM | POA: Diagnosis not present

## 2022-08-12 DIAGNOSIS — I440 Atrioventricular block, first degree: Secondary | ICD-10-CM | POA: Diagnosis not present

## 2022-08-12 DIAGNOSIS — I491 Atrial premature depolarization: Secondary | ICD-10-CM | POA: Diagnosis not present

## 2022-08-19 DIAGNOSIS — R262 Difficulty in walking, not elsewhere classified: Secondary | ICD-10-CM | POA: Diagnosis not present

## 2022-08-19 DIAGNOSIS — M545 Low back pain, unspecified: Secondary | ICD-10-CM | POA: Diagnosis not present

## 2022-08-19 DIAGNOSIS — R2681 Unsteadiness on feet: Secondary | ICD-10-CM | POA: Diagnosis not present

## 2022-08-19 DIAGNOSIS — M6281 Muscle weakness (generalized): Secondary | ICD-10-CM | POA: Diagnosis not present

## 2022-08-27 DIAGNOSIS — M545 Low back pain, unspecified: Secondary | ICD-10-CM | POA: Diagnosis not present

## 2022-08-27 DIAGNOSIS — R2681 Unsteadiness on feet: Secondary | ICD-10-CM | POA: Diagnosis not present

## 2022-08-27 DIAGNOSIS — M6281 Muscle weakness (generalized): Secondary | ICD-10-CM | POA: Diagnosis not present

## 2022-08-27 DIAGNOSIS — R262 Difficulty in walking, not elsewhere classified: Secondary | ICD-10-CM | POA: Diagnosis not present

## 2022-09-03 DIAGNOSIS — M545 Low back pain, unspecified: Secondary | ICD-10-CM | POA: Diagnosis not present

## 2022-09-03 DIAGNOSIS — R2681 Unsteadiness on feet: Secondary | ICD-10-CM | POA: Diagnosis not present

## 2022-09-03 DIAGNOSIS — R262 Difficulty in walking, not elsewhere classified: Secondary | ICD-10-CM | POA: Diagnosis not present

## 2022-09-03 DIAGNOSIS — M6281 Muscle weakness (generalized): Secondary | ICD-10-CM | POA: Diagnosis not present

## 2022-09-10 DIAGNOSIS — M545 Low back pain, unspecified: Secondary | ICD-10-CM | POA: Diagnosis not present

## 2022-09-10 DIAGNOSIS — M6281 Muscle weakness (generalized): Secondary | ICD-10-CM | POA: Diagnosis not present

## 2022-09-10 DIAGNOSIS — R262 Difficulty in walking, not elsewhere classified: Secondary | ICD-10-CM | POA: Diagnosis not present

## 2022-09-10 DIAGNOSIS — R2681 Unsteadiness on feet: Secondary | ICD-10-CM | POA: Diagnosis not present

## 2022-09-12 IMAGING — CR DG CHEST 2V
2 series · 2 of 2 positions shown · non-contrast
Comparison: None.

CLINICAL DATA: Weakness

EXAM:
CHEST - 2 VIEW

[w chest lat]
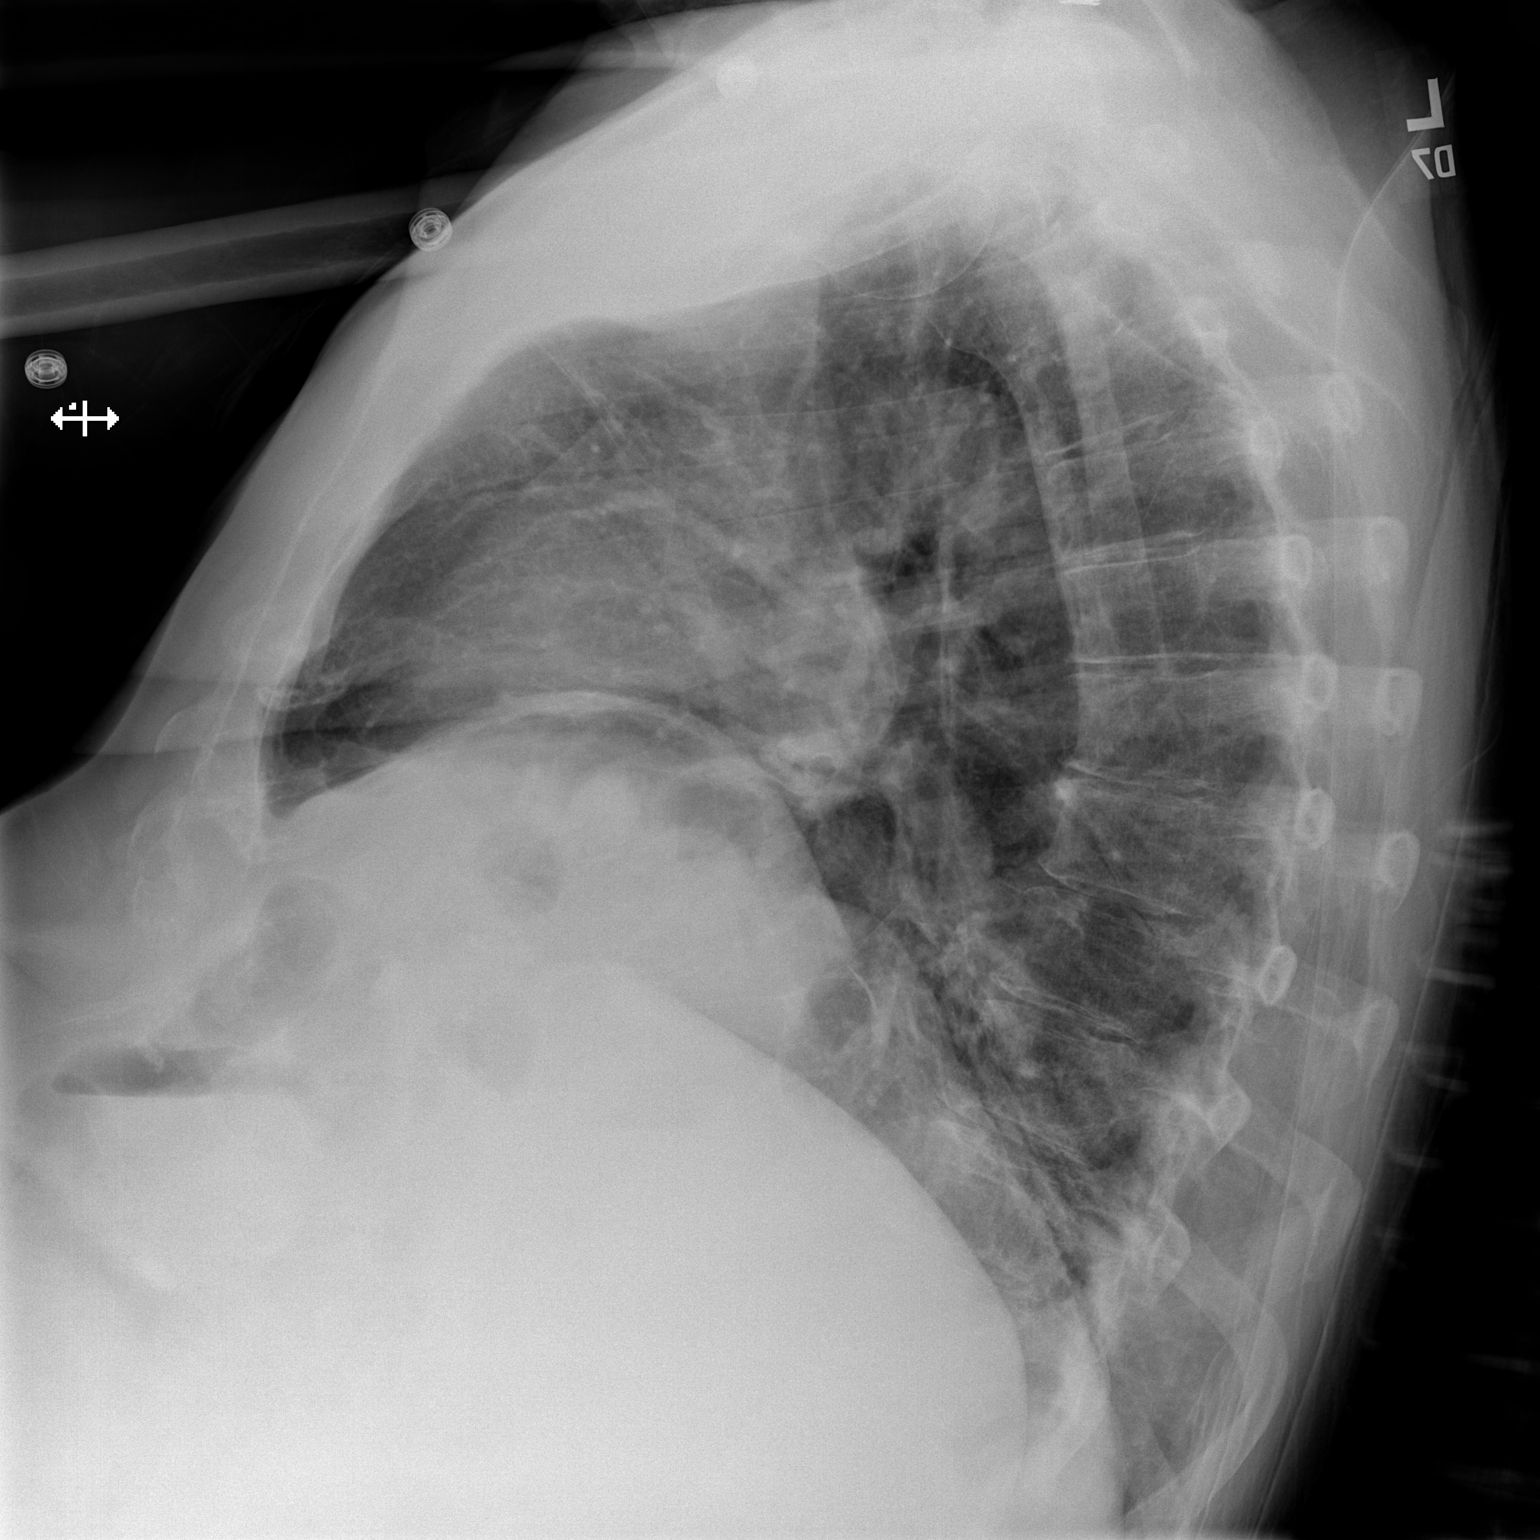

[x chest ap]
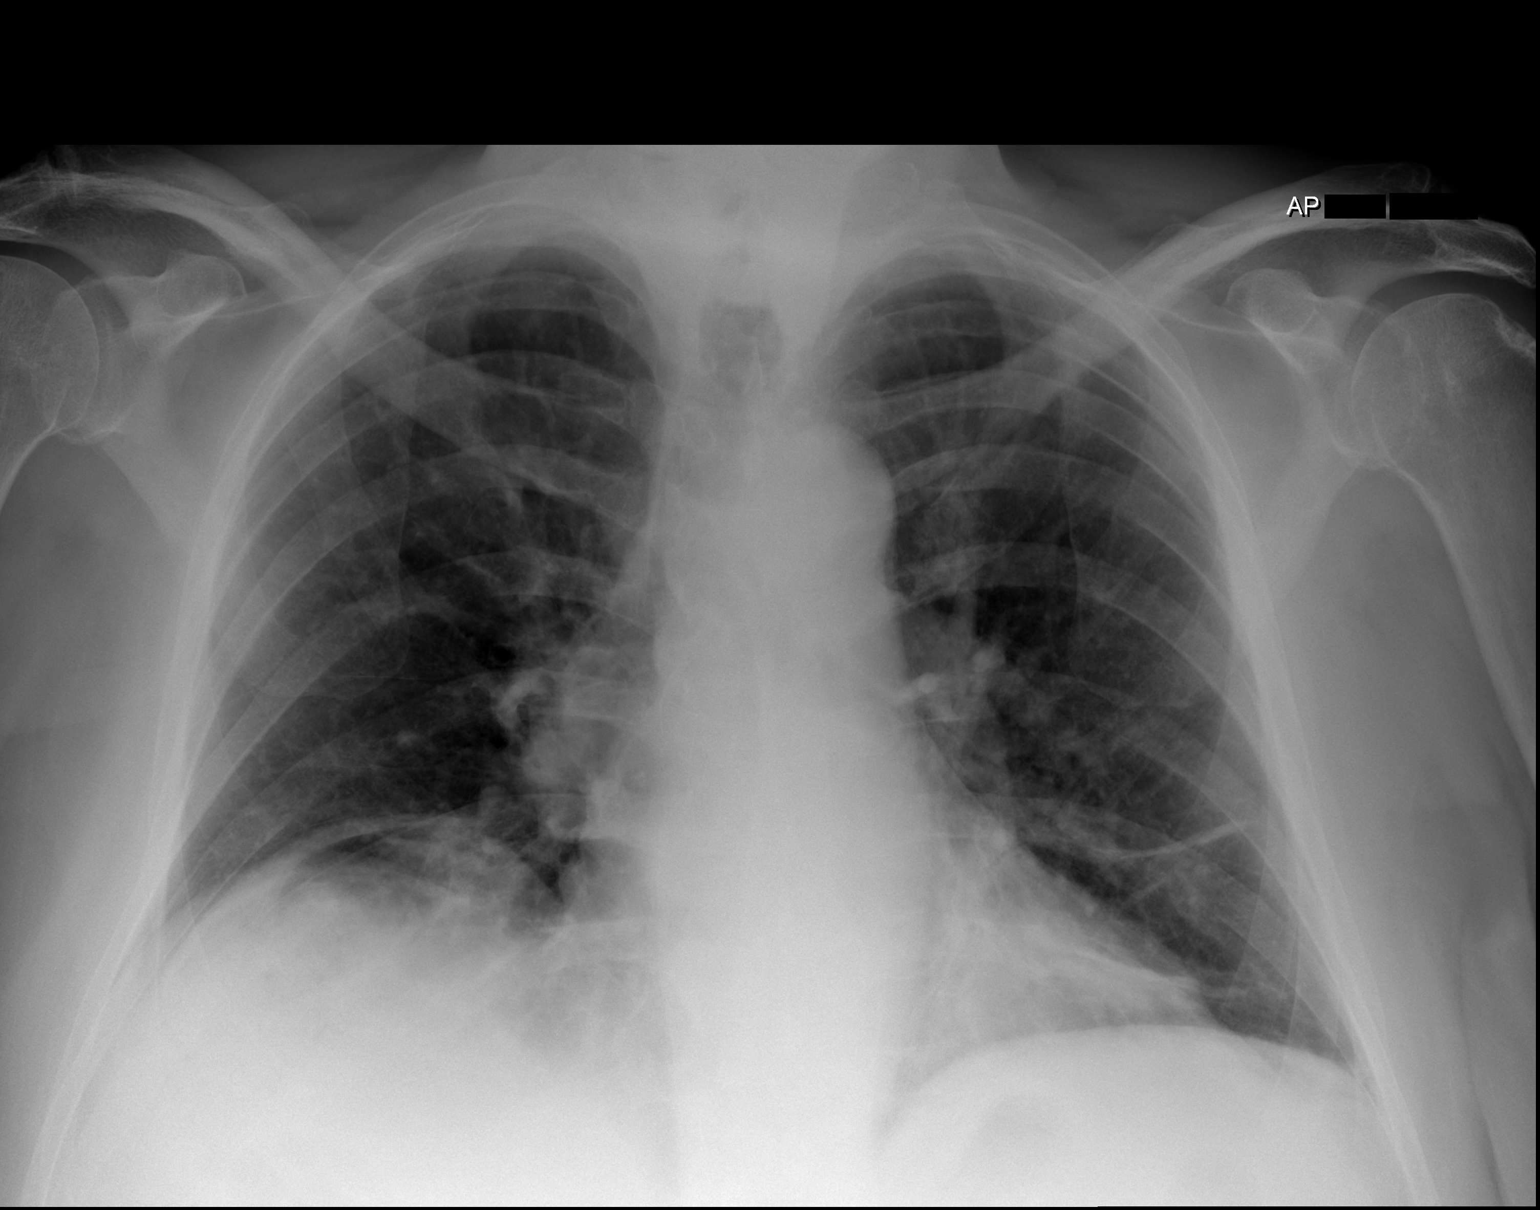

[2 of 2 positions shown; findings below may reference images not displayed]

FINDINGS: Cardiac shadows within normal limits. Patchy atelectatic changes are
noted in the bases bilaterally. No focal confluent infiltrate is
seen. No sizable effusion is noted. No acute bony abnormality is
seen.
IMPRESSION: Patchy bibasilar atelectasis.

## 2022-09-15 IMAGING — US US RENAL
1 series · 14 of 25 positions shown · non-contrast
Comparison: None

CLINICAL DATA: Acute kidney injury, hypertension, diabetes mellitus

EXAM:
RENAL / URINARY TRACT ULTRASOUND COMPLETE

[Series 1: us renal · 80 acquisitions, 14 frames shown]
[im 1/80]
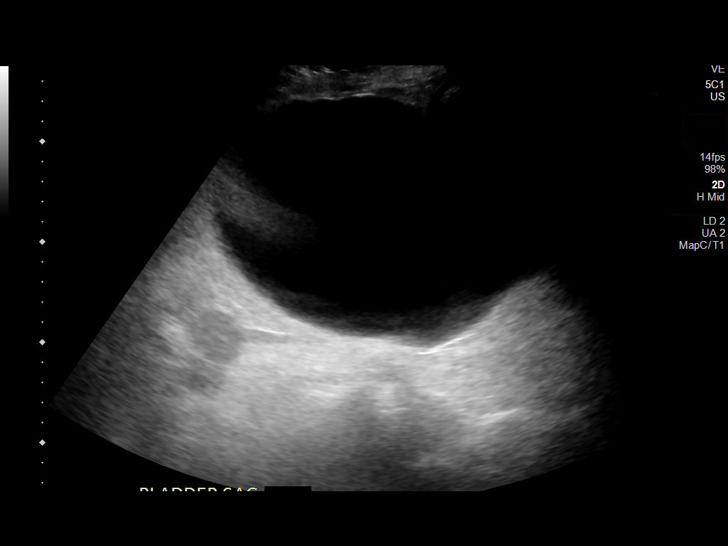
[im 7/80]
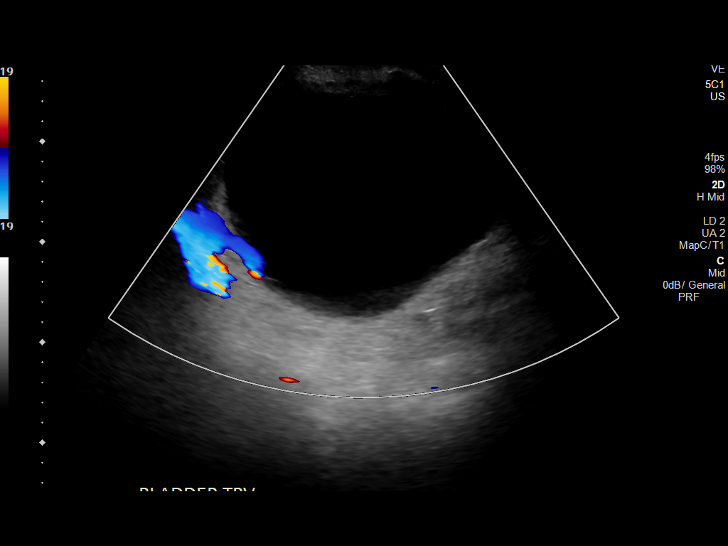
[im 14/80]
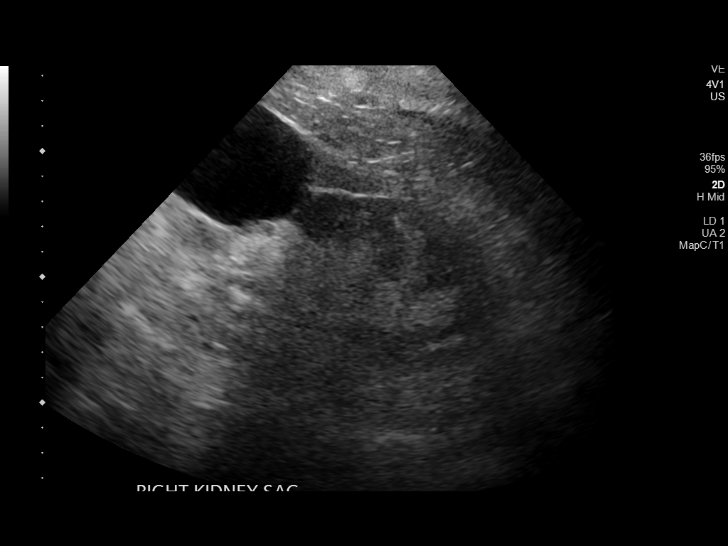
[im 20/80]
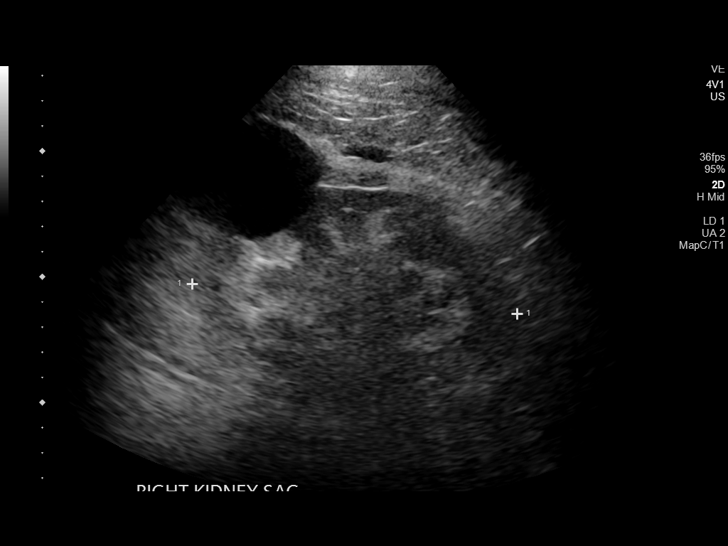
[im 27/80]
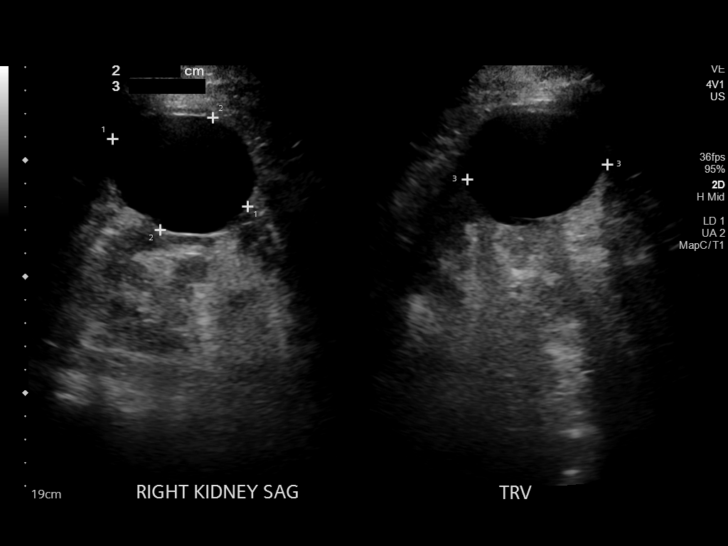
[im 30/80]
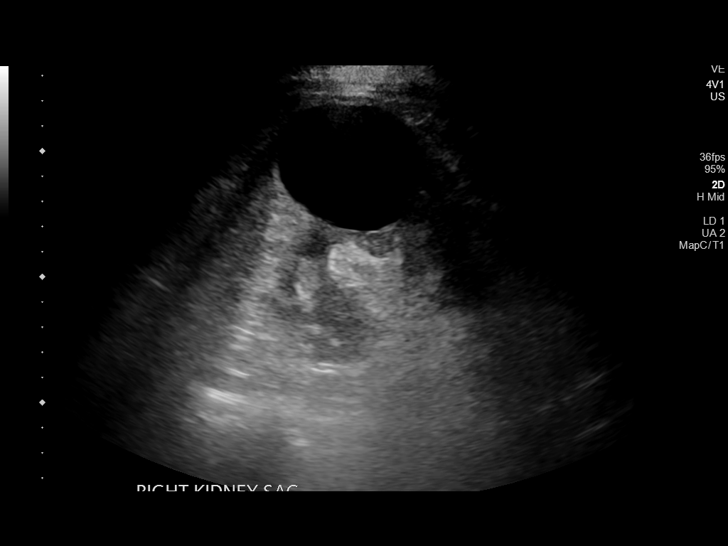
[im 37/80]
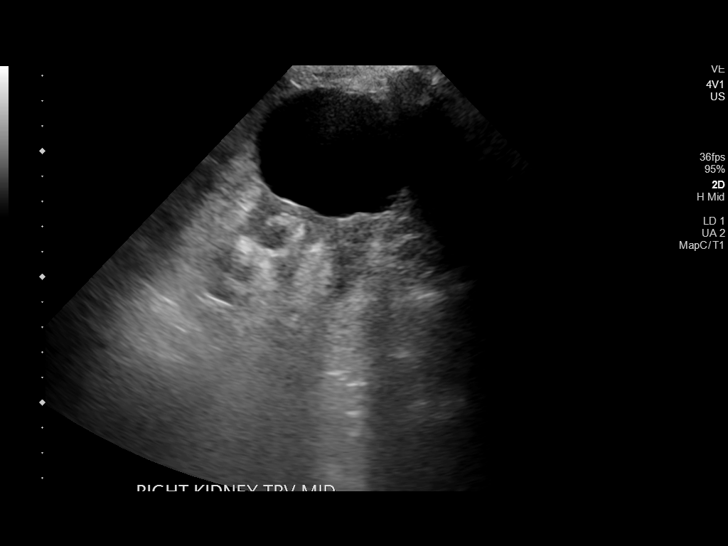
[im 43/80]
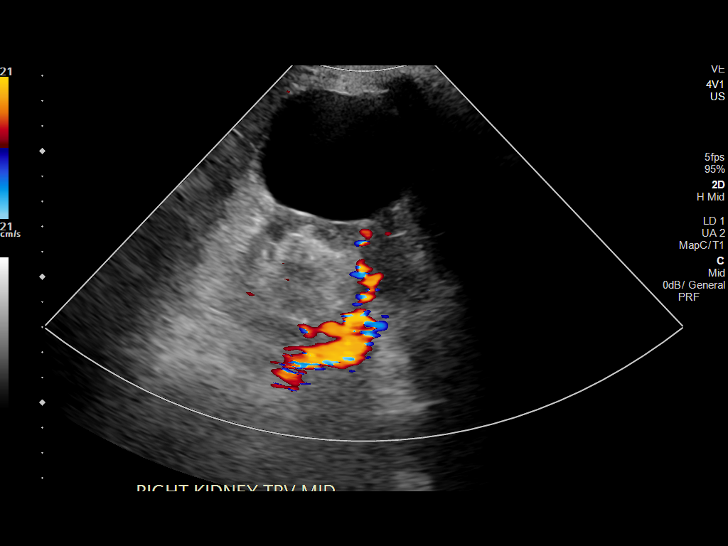
[im 50/80]
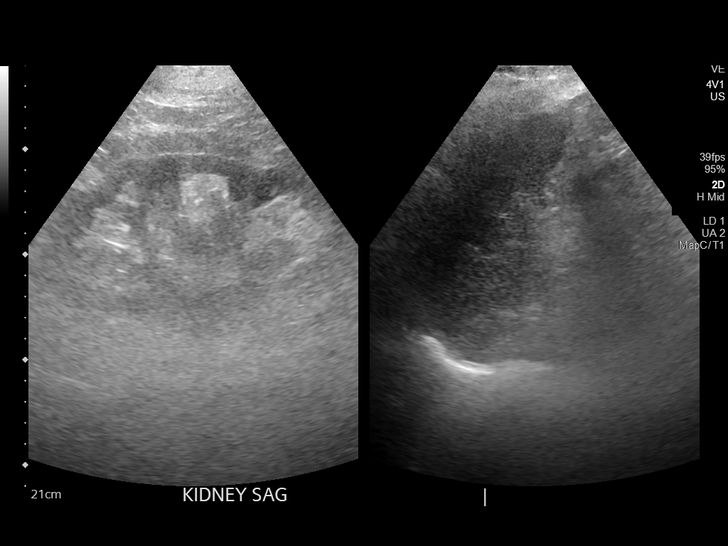
[im 53/80]
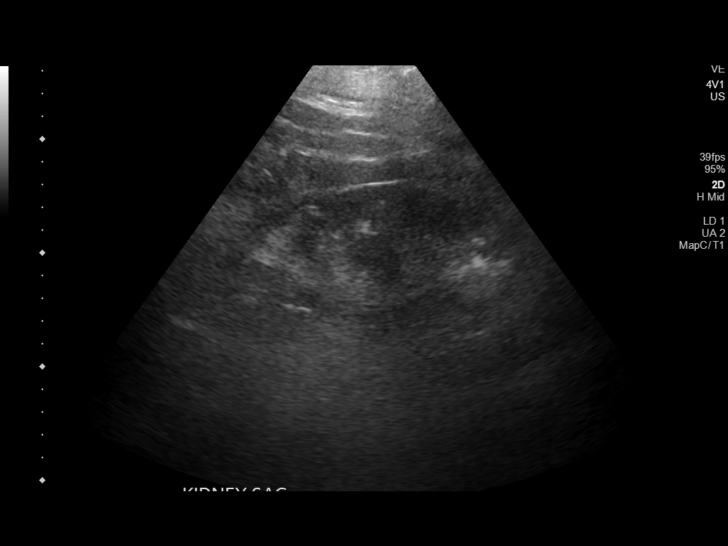
[im 60/80]
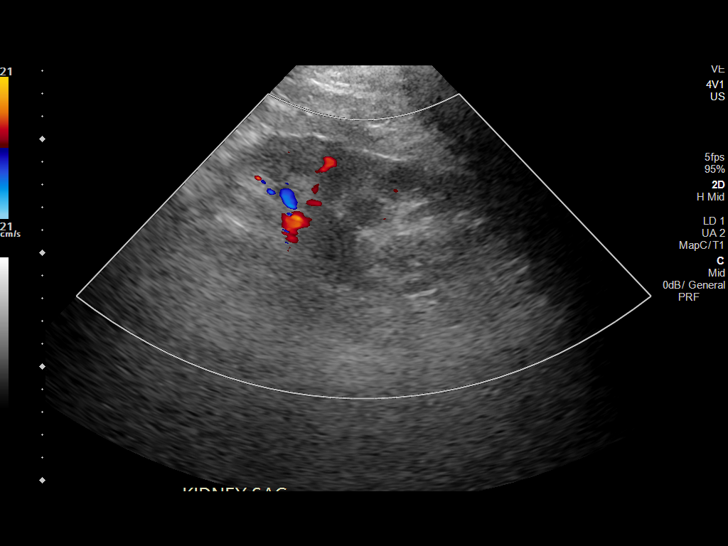
[im 66/80]
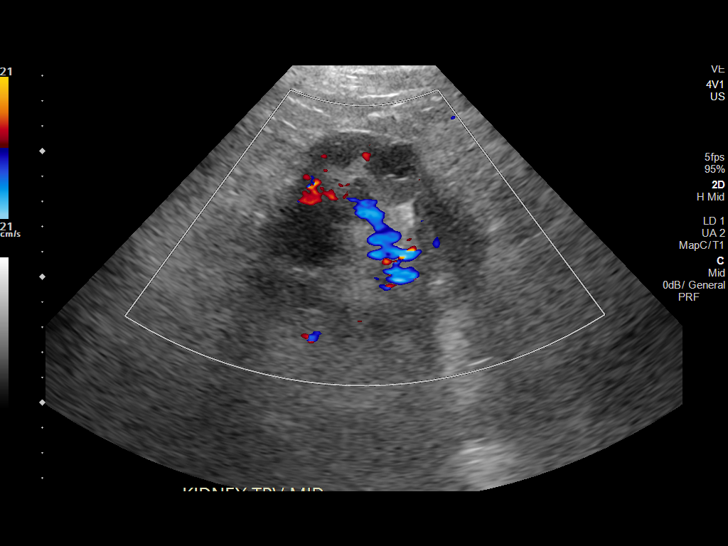
[im 73/80]
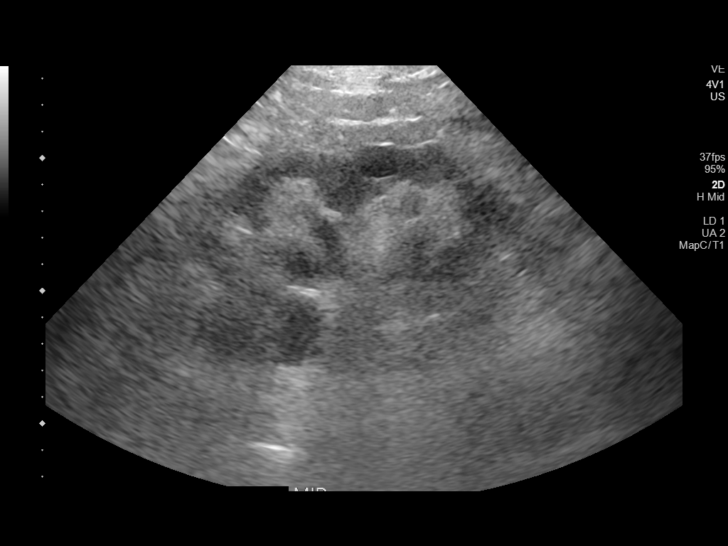
[im 80/80]
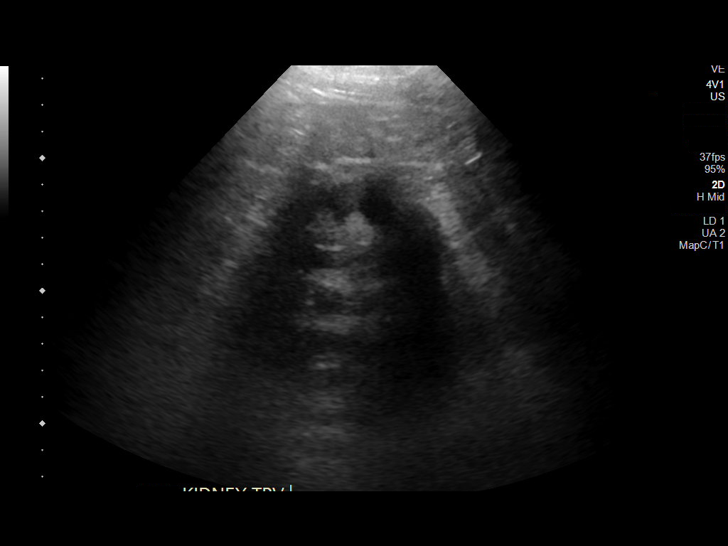

[14 of 25 positions shown; findings below may reference images not displayed]

FINDINGS: Right Kidney:

Renal measurements: 13.0 x 5.5 x 4.5 cm = volume: 170 mL. Cortical
thinning. Increased cortical echogenicity. Exophytic simple cyst
upper pole RIGHT kidney 6.5 x 5.3 x 6.1 cm. No additional mass or
hydronephrosis. No shadowing calcification.

Left Kidney:

Renal measurements: 13.7 x 7.0 x 5.6 cm = volume: 280 mL. Cortical
thinning. Increased cortical echogenicity. Tiny cyst mid kidney
x 1.3 x 1.6 cm. Additional complex peripelvic hypoechoic lesion
x 2.8 x 4.0 cm, question complicated cyst versus hypoechoic solid
mass. No calyceal dilatation or shadowing calcifications.

Bladder:

Appears normal for degree of bladder distention.

Other:

N/A
IMPRESSION: Medical renal disease changes of both kidneys.

Simple cyst upper pole RIGHT kidney 6.5 cm greatest size.

Complex peripelvic cyst versus hypoechoic solid mass of the LEFT
kidney 3.5 x 2.8 x 4.0 cm; characterization by MR recommended.

## 2022-09-17 DIAGNOSIS — R2681 Unsteadiness on feet: Secondary | ICD-10-CM | POA: Diagnosis not present

## 2022-09-17 DIAGNOSIS — M6281 Muscle weakness (generalized): Secondary | ICD-10-CM | POA: Diagnosis not present

## 2022-09-17 DIAGNOSIS — R262 Difficulty in walking, not elsewhere classified: Secondary | ICD-10-CM | POA: Diagnosis not present

## 2022-09-17 DIAGNOSIS — M545 Low back pain, unspecified: Secondary | ICD-10-CM | POA: Diagnosis not present

## 2022-09-24 DIAGNOSIS — M6281 Muscle weakness (generalized): Secondary | ICD-10-CM | POA: Diagnosis not present

## 2022-09-24 DIAGNOSIS — R2681 Unsteadiness on feet: Secondary | ICD-10-CM | POA: Diagnosis not present

## 2022-09-24 DIAGNOSIS — M545 Low back pain, unspecified: Secondary | ICD-10-CM | POA: Diagnosis not present

## 2022-09-24 DIAGNOSIS — R262 Difficulty in walking, not elsewhere classified: Secondary | ICD-10-CM | POA: Diagnosis not present

## 2022-09-30 DIAGNOSIS — N138 Other obstructive and reflux uropathy: Secondary | ICD-10-CM | POA: Diagnosis not present

## 2022-09-30 DIAGNOSIS — N401 Enlarged prostate with lower urinary tract symptoms: Secondary | ICD-10-CM | POA: Diagnosis not present

## 2022-10-02 DIAGNOSIS — M6281 Muscle weakness (generalized): Secondary | ICD-10-CM | POA: Diagnosis not present

## 2022-10-02 DIAGNOSIS — R262 Difficulty in walking, not elsewhere classified: Secondary | ICD-10-CM | POA: Diagnosis not present

## 2022-10-02 DIAGNOSIS — M545 Low back pain, unspecified: Secondary | ICD-10-CM | POA: Diagnosis not present

## 2022-10-02 DIAGNOSIS — R2681 Unsteadiness on feet: Secondary | ICD-10-CM | POA: Diagnosis not present

## 2022-10-07 DIAGNOSIS — R8290 Unspecified abnormal findings in urine: Secondary | ICD-10-CM | POA: Diagnosis not present

## 2022-10-07 DIAGNOSIS — R339 Retention of urine, unspecified: Secondary | ICD-10-CM | POA: Diagnosis not present

## 2022-10-08 DIAGNOSIS — M545 Low back pain, unspecified: Secondary | ICD-10-CM | POA: Diagnosis not present

## 2022-10-08 DIAGNOSIS — M6281 Muscle weakness (generalized): Secondary | ICD-10-CM | POA: Diagnosis not present

## 2022-10-08 DIAGNOSIS — R2681 Unsteadiness on feet: Secondary | ICD-10-CM | POA: Diagnosis not present

## 2022-10-08 DIAGNOSIS — R262 Difficulty in walking, not elsewhere classified: Secondary | ICD-10-CM | POA: Diagnosis not present

## 2022-10-15 DIAGNOSIS — M6281 Muscle weakness (generalized): Secondary | ICD-10-CM | POA: Diagnosis not present

## 2022-10-15 DIAGNOSIS — M545 Low back pain, unspecified: Secondary | ICD-10-CM | POA: Diagnosis not present

## 2022-10-15 DIAGNOSIS — R262 Difficulty in walking, not elsewhere classified: Secondary | ICD-10-CM | POA: Diagnosis not present

## 2022-10-15 DIAGNOSIS — R2681 Unsteadiness on feet: Secondary | ICD-10-CM | POA: Diagnosis not present

## 2022-10-22 DIAGNOSIS — R262 Difficulty in walking, not elsewhere classified: Secondary | ICD-10-CM | POA: Diagnosis not present

## 2022-10-22 DIAGNOSIS — R2681 Unsteadiness on feet: Secondary | ICD-10-CM | POA: Diagnosis not present

## 2022-10-22 DIAGNOSIS — M6281 Muscle weakness (generalized): Secondary | ICD-10-CM | POA: Diagnosis not present

## 2022-10-22 DIAGNOSIS — M545 Low back pain, unspecified: Secondary | ICD-10-CM | POA: Diagnosis not present

## 2022-10-29 DIAGNOSIS — M545 Low back pain, unspecified: Secondary | ICD-10-CM | POA: Diagnosis not present

## 2022-10-29 DIAGNOSIS — M6281 Muscle weakness (generalized): Secondary | ICD-10-CM | POA: Diagnosis not present

## 2022-10-29 DIAGNOSIS — R2681 Unsteadiness on feet: Secondary | ICD-10-CM | POA: Diagnosis not present

## 2022-10-29 DIAGNOSIS — R262 Difficulty in walking, not elsewhere classified: Secondary | ICD-10-CM | POA: Diagnosis not present

## 2022-11-05 DIAGNOSIS — M545 Low back pain, unspecified: Secondary | ICD-10-CM | POA: Diagnosis not present

## 2022-11-05 DIAGNOSIS — R2681 Unsteadiness on feet: Secondary | ICD-10-CM | POA: Diagnosis not present

## 2022-11-05 DIAGNOSIS — M6281 Muscle weakness (generalized): Secondary | ICD-10-CM | POA: Diagnosis not present

## 2022-11-05 DIAGNOSIS — R262 Difficulty in walking, not elsewhere classified: Secondary | ICD-10-CM | POA: Diagnosis not present

## 2022-11-12 DIAGNOSIS — M6281 Muscle weakness (generalized): Secondary | ICD-10-CM | POA: Diagnosis not present

## 2022-11-12 DIAGNOSIS — R262 Difficulty in walking, not elsewhere classified: Secondary | ICD-10-CM | POA: Diagnosis not present

## 2022-11-12 DIAGNOSIS — M545 Low back pain, unspecified: Secondary | ICD-10-CM | POA: Diagnosis not present

## 2022-11-12 DIAGNOSIS — R2681 Unsteadiness on feet: Secondary | ICD-10-CM | POA: Diagnosis not present

## 2022-11-19 DIAGNOSIS — R2681 Unsteadiness on feet: Secondary | ICD-10-CM | POA: Diagnosis not present

## 2022-11-19 DIAGNOSIS — R262 Difficulty in walking, not elsewhere classified: Secondary | ICD-10-CM | POA: Diagnosis not present

## 2022-11-19 DIAGNOSIS — M545 Low back pain, unspecified: Secondary | ICD-10-CM | POA: Diagnosis not present

## 2022-11-19 DIAGNOSIS — M6281 Muscle weakness (generalized): Secondary | ICD-10-CM | POA: Diagnosis not present

## 2022-11-25 DIAGNOSIS — E785 Hyperlipidemia, unspecified: Secondary | ICD-10-CM | POA: Diagnosis not present

## 2022-11-25 DIAGNOSIS — I480 Paroxysmal atrial fibrillation: Secondary | ICD-10-CM | POA: Diagnosis not present

## 2022-11-25 DIAGNOSIS — E1122 Type 2 diabetes mellitus with diabetic chronic kidney disease: Secondary | ICD-10-CM | POA: Diagnosis not present

## 2022-11-25 DIAGNOSIS — N182 Chronic kidney disease, stage 2 (mild): Secondary | ICD-10-CM | POA: Diagnosis not present

## 2022-11-25 DIAGNOSIS — Z794 Long term (current) use of insulin: Secondary | ICD-10-CM | POA: Diagnosis not present

## 2022-11-25 DIAGNOSIS — M5136 Other intervertebral disc degeneration, lumbar region: Secondary | ICD-10-CM | POA: Diagnosis not present

## 2022-11-25 DIAGNOSIS — R5383 Other fatigue: Secondary | ICD-10-CM | POA: Diagnosis not present

## 2022-11-25 DIAGNOSIS — E1169 Type 2 diabetes mellitus with other specified complication: Secondary | ICD-10-CM | POA: Diagnosis not present

## 2022-11-25 DIAGNOSIS — I10 Essential (primary) hypertension: Secondary | ICD-10-CM | POA: Diagnosis not present

## 2022-11-25 DIAGNOSIS — D6869 Other thrombophilia: Secondary | ICD-10-CM | POA: Diagnosis not present

## 2022-11-25 DIAGNOSIS — I6521 Occlusion and stenosis of right carotid artery: Secondary | ICD-10-CM | POA: Diagnosis not present

## 2022-11-26 ENCOUNTER — Other Ambulatory Visit: Payer: Self-pay | Admitting: Family Medicine

## 2022-11-26 DIAGNOSIS — M6281 Muscle weakness (generalized): Secondary | ICD-10-CM | POA: Diagnosis not present

## 2022-11-26 DIAGNOSIS — M545 Low back pain, unspecified: Secondary | ICD-10-CM | POA: Diagnosis not present

## 2022-11-26 DIAGNOSIS — R262 Difficulty in walking, not elsewhere classified: Secondary | ICD-10-CM | POA: Diagnosis not present

## 2022-11-26 DIAGNOSIS — R2681 Unsteadiness on feet: Secondary | ICD-10-CM | POA: Diagnosis not present

## 2022-11-26 DIAGNOSIS — M5136 Other intervertebral disc degeneration, lumbar region: Secondary | ICD-10-CM

## 2022-12-01 DIAGNOSIS — R262 Difficulty in walking, not elsewhere classified: Secondary | ICD-10-CM | POA: Diagnosis not present

## 2022-12-01 DIAGNOSIS — M545 Low back pain, unspecified: Secondary | ICD-10-CM | POA: Diagnosis not present

## 2022-12-01 DIAGNOSIS — R2681 Unsteadiness on feet: Secondary | ICD-10-CM | POA: Diagnosis not present

## 2022-12-01 DIAGNOSIS — M6281 Muscle weakness (generalized): Secondary | ICD-10-CM | POA: Diagnosis not present

## 2022-12-09 DIAGNOSIS — B961 Klebsiella pneumoniae [K. pneumoniae] as the cause of diseases classified elsewhere: Secondary | ICD-10-CM | POA: Diagnosis not present

## 2022-12-09 DIAGNOSIS — Z7982 Long term (current) use of aspirin: Secondary | ICD-10-CM | POA: Diagnosis not present

## 2022-12-09 DIAGNOSIS — R0682 Tachypnea, not elsewhere classified: Secondary | ICD-10-CM | POA: Diagnosis not present

## 2022-12-09 DIAGNOSIS — K8031 Calculus of bile duct with cholangitis, unspecified, with obstruction: Secondary | ICD-10-CM | POA: Diagnosis not present

## 2022-12-09 DIAGNOSIS — Z8616 Personal history of COVID-19: Secondary | ICD-10-CM | POA: Diagnosis not present

## 2022-12-09 DIAGNOSIS — K828 Other specified diseases of gallbladder: Secondary | ICD-10-CM | POA: Diagnosis not present

## 2022-12-09 DIAGNOSIS — I959 Hypotension, unspecified: Secondary | ICD-10-CM | POA: Diagnosis not present

## 2022-12-09 DIAGNOSIS — N189 Chronic kidney disease, unspecified: Secondary | ICD-10-CM | POA: Diagnosis not present

## 2022-12-09 DIAGNOSIS — Z87440 Personal history of urinary (tract) infections: Secondary | ICD-10-CM | POA: Diagnosis not present

## 2022-12-09 DIAGNOSIS — M6282 Rhabdomyolysis: Secondary | ICD-10-CM | POA: Diagnosis not present

## 2022-12-09 DIAGNOSIS — W19XXXA Unspecified fall, initial encounter: Secondary | ICD-10-CM | POA: Diagnosis not present

## 2022-12-09 DIAGNOSIS — R739 Hyperglycemia, unspecified: Secondary | ICD-10-CM | POA: Diagnosis not present

## 2022-12-09 DIAGNOSIS — K8309 Other cholangitis: Secondary | ICD-10-CM | POA: Diagnosis not present

## 2022-12-09 DIAGNOSIS — Z79899 Other long term (current) drug therapy: Secondary | ICD-10-CM | POA: Diagnosis not present

## 2022-12-09 DIAGNOSIS — A419 Sepsis, unspecified organism: Secondary | ICD-10-CM | POA: Diagnosis not present

## 2022-12-09 DIAGNOSIS — N179 Acute kidney failure, unspecified: Secondary | ICD-10-CM | POA: Diagnosis not present

## 2022-12-09 DIAGNOSIS — R509 Fever, unspecified: Secondary | ICD-10-CM | POA: Diagnosis not present

## 2022-12-09 DIAGNOSIS — D696 Thrombocytopenia, unspecified: Secondary | ICD-10-CM | POA: Diagnosis not present

## 2022-12-09 DIAGNOSIS — E1122 Type 2 diabetes mellitus with diabetic chronic kidney disease: Secondary | ICD-10-CM | POA: Diagnosis not present

## 2022-12-09 DIAGNOSIS — R0689 Other abnormalities of breathing: Secondary | ICD-10-CM | POA: Diagnosis not present

## 2022-12-09 DIAGNOSIS — K8033 Calculus of bile duct with acute cholangitis with obstruction: Secondary | ICD-10-CM | POA: Diagnosis not present

## 2022-12-09 DIAGNOSIS — R7881 Bacteremia: Secondary | ICD-10-CM | POA: Diagnosis not present

## 2022-12-09 DIAGNOSIS — K8030 Calculus of bile duct with cholangitis, unspecified, without obstruction: Secondary | ICD-10-CM | POA: Diagnosis not present

## 2022-12-09 DIAGNOSIS — K838 Other specified diseases of biliary tract: Secondary | ICD-10-CM | POA: Diagnosis not present

## 2022-12-09 DIAGNOSIS — E872 Acidosis, unspecified: Secondary | ICD-10-CM | POA: Diagnosis not present

## 2022-12-09 DIAGNOSIS — I10 Essential (primary) hypertension: Secondary | ICD-10-CM | POA: Diagnosis not present

## 2022-12-09 DIAGNOSIS — I129 Hypertensive chronic kidney disease with stage 1 through stage 4 chronic kidney disease, or unspecified chronic kidney disease: Secondary | ICD-10-CM | POA: Diagnosis not present

## 2022-12-09 DIAGNOSIS — A4159 Other Gram-negative sepsis: Secondary | ICD-10-CM | POA: Diagnosis not present

## 2022-12-09 DIAGNOSIS — Z20822 Contact with and (suspected) exposure to covid-19: Secondary | ICD-10-CM | POA: Diagnosis not present

## 2022-12-09 DIAGNOSIS — Z743 Need for continuous supervision: Secondary | ICD-10-CM | POA: Diagnosis not present

## 2022-12-09 DIAGNOSIS — R6520 Severe sepsis without septic shock: Secondary | ICD-10-CM | POA: Diagnosis not present

## 2022-12-09 DIAGNOSIS — Z833 Family history of diabetes mellitus: Secondary | ICD-10-CM | POA: Diagnosis not present

## 2022-12-09 DIAGNOSIS — E785 Hyperlipidemia, unspecified: Secondary | ICD-10-CM | POA: Diagnosis not present

## 2022-12-09 DIAGNOSIS — K8689 Other specified diseases of pancreas: Secondary | ICD-10-CM | POA: Diagnosis not present

## 2022-12-09 DIAGNOSIS — Z9889 Other specified postprocedural states: Secondary | ICD-10-CM | POA: Diagnosis not present

## 2022-12-09 DIAGNOSIS — N400 Enlarged prostate without lower urinary tract symptoms: Secondary | ICD-10-CM | POA: Diagnosis not present

## 2022-12-09 DIAGNOSIS — Z794 Long term (current) use of insulin: Secondary | ICD-10-CM | POA: Diagnosis not present

## 2022-12-09 DIAGNOSIS — R4182 Altered mental status, unspecified: Secondary | ICD-10-CM | POA: Diagnosis not present

## 2022-12-09 DIAGNOSIS — Z8249 Family history of ischemic heart disease and other diseases of the circulatory system: Secondary | ICD-10-CM | POA: Diagnosis not present

## 2022-12-09 DIAGNOSIS — Z86010 Personal history of colonic polyps: Secondary | ICD-10-CM | POA: Diagnosis not present

## 2022-12-09 DIAGNOSIS — K8050 Calculus of bile duct without cholangitis or cholecystitis without obstruction: Secondary | ICD-10-CM | POA: Diagnosis not present

## 2022-12-16 DIAGNOSIS — D649 Anemia, unspecified: Secondary | ICD-10-CM | POA: Diagnosis not present

## 2022-12-16 DIAGNOSIS — I10 Essential (primary) hypertension: Secondary | ICD-10-CM | POA: Diagnosis not present

## 2022-12-16 DIAGNOSIS — J309 Allergic rhinitis, unspecified: Secondary | ICD-10-CM | POA: Diagnosis not present

## 2022-12-16 DIAGNOSIS — N179 Acute kidney failure, unspecified: Secondary | ICD-10-CM | POA: Diagnosis not present

## 2022-12-16 DIAGNOSIS — A419 Sepsis, unspecified organism: Secondary | ICD-10-CM | POA: Diagnosis not present

## 2022-12-16 DIAGNOSIS — R7881 Bacteremia: Secondary | ICD-10-CM | POA: Diagnosis not present

## 2022-12-16 DIAGNOSIS — K8309 Other cholangitis: Secondary | ICD-10-CM | POA: Diagnosis not present

## 2022-12-16 DIAGNOSIS — Z743 Need for continuous supervision: Secondary | ICD-10-CM | POA: Diagnosis not present

## 2022-12-16 DIAGNOSIS — E785 Hyperlipidemia, unspecified: Secondary | ICD-10-CM | POA: Diagnosis not present

## 2022-12-16 DIAGNOSIS — E559 Vitamin D deficiency, unspecified: Secondary | ICD-10-CM | POA: Diagnosis not present

## 2022-12-16 DIAGNOSIS — N390 Urinary tract infection, site not specified: Secondary | ICD-10-CM | POA: Diagnosis not present

## 2022-12-16 DIAGNOSIS — E11319 Type 2 diabetes mellitus with unspecified diabetic retinopathy without macular edema: Secondary | ICD-10-CM | POA: Diagnosis not present

## 2022-12-16 DIAGNOSIS — E119 Type 2 diabetes mellitus without complications: Secondary | ICD-10-CM | POA: Diagnosis not present

## 2022-12-16 DIAGNOSIS — R600 Localized edema: Secondary | ICD-10-CM | POA: Diagnosis not present

## 2022-12-16 DIAGNOSIS — I4891 Unspecified atrial fibrillation: Secondary | ICD-10-CM | POA: Diagnosis not present

## 2022-12-16 DIAGNOSIS — M1990 Unspecified osteoarthritis, unspecified site: Secondary | ICD-10-CM | POA: Diagnosis not present

## 2022-12-18 ENCOUNTER — Other Ambulatory Visit: Payer: PPO

## 2022-12-18 DIAGNOSIS — R7881 Bacteremia: Secondary | ICD-10-CM | POA: Diagnosis not present

## 2022-12-18 DIAGNOSIS — I4891 Unspecified atrial fibrillation: Secondary | ICD-10-CM | POA: Diagnosis not present

## 2022-12-18 DIAGNOSIS — N179 Acute kidney failure, unspecified: Secondary | ICD-10-CM | POA: Diagnosis not present

## 2022-12-21 DIAGNOSIS — E785 Hyperlipidemia, unspecified: Secondary | ICD-10-CM | POA: Diagnosis not present

## 2022-12-21 DIAGNOSIS — M1990 Unspecified osteoarthritis, unspecified site: Secondary | ICD-10-CM | POA: Diagnosis not present

## 2022-12-21 DIAGNOSIS — E119 Type 2 diabetes mellitus without complications: Secondary | ICD-10-CM | POA: Diagnosis not present

## 2022-12-21 DIAGNOSIS — J309 Allergic rhinitis, unspecified: Secondary | ICD-10-CM | POA: Diagnosis not present

## 2022-12-21 DIAGNOSIS — I4891 Unspecified atrial fibrillation: Secondary | ICD-10-CM | POA: Diagnosis not present

## 2022-12-21 DIAGNOSIS — I10 Essential (primary) hypertension: Secondary | ICD-10-CM | POA: Diagnosis not present

## 2022-12-21 DIAGNOSIS — D649 Anemia, unspecified: Secondary | ICD-10-CM | POA: Diagnosis not present

## 2022-12-21 DIAGNOSIS — E559 Vitamin D deficiency, unspecified: Secondary | ICD-10-CM | POA: Diagnosis not present

## 2022-12-21 DIAGNOSIS — E11319 Type 2 diabetes mellitus with unspecified diabetic retinopathy without macular edema: Secondary | ICD-10-CM | POA: Diagnosis not present

## 2022-12-21 DIAGNOSIS — R600 Localized edema: Secondary | ICD-10-CM | POA: Diagnosis not present

## 2022-12-28 DIAGNOSIS — E119 Type 2 diabetes mellitus without complications: Secondary | ICD-10-CM | POA: Diagnosis not present

## 2022-12-28 DIAGNOSIS — I4891 Unspecified atrial fibrillation: Secondary | ICD-10-CM | POA: Diagnosis not present

## 2022-12-28 DIAGNOSIS — I10 Essential (primary) hypertension: Secondary | ICD-10-CM | POA: Diagnosis not present

## 2022-12-28 DIAGNOSIS — J309 Allergic rhinitis, unspecified: Secondary | ICD-10-CM | POA: Diagnosis not present

## 2022-12-28 DIAGNOSIS — E11319 Type 2 diabetes mellitus with unspecified diabetic retinopathy without macular edema: Secondary | ICD-10-CM | POA: Diagnosis not present

## 2022-12-28 DIAGNOSIS — D649 Anemia, unspecified: Secondary | ICD-10-CM | POA: Diagnosis not present

## 2022-12-28 DIAGNOSIS — E785 Hyperlipidemia, unspecified: Secondary | ICD-10-CM | POA: Diagnosis not present

## 2023-01-04 DIAGNOSIS — M1990 Unspecified osteoarthritis, unspecified site: Secondary | ICD-10-CM | POA: Diagnosis not present

## 2023-01-04 DIAGNOSIS — E11319 Type 2 diabetes mellitus with unspecified diabetic retinopathy without macular edema: Secondary | ICD-10-CM | POA: Diagnosis not present

## 2023-01-04 DIAGNOSIS — J309 Allergic rhinitis, unspecified: Secondary | ICD-10-CM | POA: Diagnosis not present

## 2023-01-04 DIAGNOSIS — D649 Anemia, unspecified: Secondary | ICD-10-CM | POA: Diagnosis not present

## 2023-01-04 DIAGNOSIS — I4891 Unspecified atrial fibrillation: Secondary | ICD-10-CM | POA: Diagnosis not present

## 2023-01-04 DIAGNOSIS — E785 Hyperlipidemia, unspecified: Secondary | ICD-10-CM | POA: Diagnosis not present

## 2023-01-04 DIAGNOSIS — E119 Type 2 diabetes mellitus without complications: Secondary | ICD-10-CM | POA: Diagnosis not present

## 2023-01-12 DIAGNOSIS — D509 Iron deficiency anemia, unspecified: Secondary | ICD-10-CM | POA: Diagnosis not present

## 2023-01-12 DIAGNOSIS — Z9181 History of falling: Secondary | ICD-10-CM | POA: Diagnosis not present

## 2023-01-12 DIAGNOSIS — D6869 Other thrombophilia: Secondary | ICD-10-CM | POA: Diagnosis not present

## 2023-01-12 DIAGNOSIS — E559 Vitamin D deficiency, unspecified: Secondary | ICD-10-CM | POA: Diagnosis not present

## 2023-01-12 DIAGNOSIS — M1990 Unspecified osteoarthritis, unspecified site: Secondary | ICD-10-CM | POA: Diagnosis not present

## 2023-01-12 DIAGNOSIS — E669 Obesity, unspecified: Secondary | ICD-10-CM | POA: Diagnosis not present

## 2023-01-12 DIAGNOSIS — Z794 Long term (current) use of insulin: Secondary | ICD-10-CM | POA: Diagnosis not present

## 2023-01-12 DIAGNOSIS — K8309 Other cholangitis: Secondary | ICD-10-CM | POA: Diagnosis not present

## 2023-01-12 DIAGNOSIS — M6282 Rhabdomyolysis: Secondary | ICD-10-CM | POA: Diagnosis not present

## 2023-01-12 DIAGNOSIS — Z6830 Body mass index (BMI) 30.0-30.9, adult: Secondary | ICD-10-CM | POA: Diagnosis not present

## 2023-01-12 DIAGNOSIS — I4891 Unspecified atrial fibrillation: Secondary | ICD-10-CM | POA: Diagnosis not present

## 2023-01-12 DIAGNOSIS — J309 Allergic rhinitis, unspecified: Secondary | ICD-10-CM | POA: Diagnosis not present

## 2023-01-12 DIAGNOSIS — E785 Hyperlipidemia, unspecified: Secondary | ICD-10-CM | POA: Diagnosis not present

## 2023-01-12 DIAGNOSIS — Z8616 Personal history of COVID-19: Secondary | ICD-10-CM | POA: Diagnosis not present

## 2023-01-12 DIAGNOSIS — Z556 Problems related to health literacy: Secondary | ICD-10-CM | POA: Diagnosis not present

## 2023-01-12 DIAGNOSIS — Z87440 Personal history of urinary (tract) infections: Secondary | ICD-10-CM | POA: Diagnosis not present

## 2023-01-12 DIAGNOSIS — I10 Essential (primary) hypertension: Secondary | ICD-10-CM | POA: Diagnosis not present

## 2023-01-12 DIAGNOSIS — N200 Calculus of kidney: Secondary | ICD-10-CM | POA: Diagnosis not present

## 2023-01-12 DIAGNOSIS — N400 Enlarged prostate without lower urinary tract symptoms: Secondary | ICD-10-CM | POA: Diagnosis not present

## 2023-01-12 DIAGNOSIS — E11319 Type 2 diabetes mellitus with unspecified diabetic retinopathy without macular edema: Secondary | ICD-10-CM | POA: Diagnosis not present

## 2023-01-12 DIAGNOSIS — Z7984 Long term (current) use of oral hypoglycemic drugs: Secondary | ICD-10-CM | POA: Diagnosis not present

## 2023-01-12 DIAGNOSIS — Z7982 Long term (current) use of aspirin: Secondary | ICD-10-CM | POA: Diagnosis not present

## 2023-01-18 DIAGNOSIS — Z8719 Personal history of other diseases of the digestive system: Secondary | ICD-10-CM | POA: Diagnosis not present

## 2023-01-18 DIAGNOSIS — Z8619 Personal history of other infectious and parasitic diseases: Secondary | ICD-10-CM | POA: Diagnosis not present

## 2023-01-18 DIAGNOSIS — N182 Chronic kidney disease, stage 2 (mild): Secondary | ICD-10-CM | POA: Diagnosis not present

## 2023-01-25 DIAGNOSIS — K819 Cholecystitis, unspecified: Secondary | ICD-10-CM | POA: Diagnosis not present

## 2023-01-28 DIAGNOSIS — N182 Chronic kidney disease, stage 2 (mild): Secondary | ICD-10-CM | POA: Diagnosis not present

## 2023-01-28 DIAGNOSIS — E559 Vitamin D deficiency, unspecified: Secondary | ICD-10-CM | POA: Diagnosis not present

## 2023-01-29 DIAGNOSIS — K8070 Calculus of gallbladder and bile duct without cholecystitis without obstruction: Secondary | ICD-10-CM | POA: Diagnosis not present

## 2023-01-29 DIAGNOSIS — Q453 Other congenital malformations of pancreas and pancreatic duct: Secondary | ICD-10-CM | POA: Diagnosis not present

## 2023-01-29 DIAGNOSIS — K8020 Calculus of gallbladder without cholecystitis without obstruction: Secondary | ICD-10-CM | POA: Diagnosis not present

## 2023-01-29 DIAGNOSIS — K862 Cyst of pancreas: Secondary | ICD-10-CM | POA: Diagnosis not present

## 2023-01-29 DIAGNOSIS — R109 Unspecified abdominal pain: Secondary | ICD-10-CM | POA: Diagnosis not present

## 2023-02-02 DIAGNOSIS — I129 Hypertensive chronic kidney disease with stage 1 through stage 4 chronic kidney disease, or unspecified chronic kidney disease: Secondary | ICD-10-CM | POA: Diagnosis not present

## 2023-02-02 DIAGNOSIS — N400 Enlarged prostate without lower urinary tract symptoms: Secondary | ICD-10-CM | POA: Diagnosis not present

## 2023-02-02 DIAGNOSIS — E1122 Type 2 diabetes mellitus with diabetic chronic kidney disease: Secondary | ICD-10-CM | POA: Diagnosis not present

## 2023-02-02 DIAGNOSIS — N1831 Chronic kidney disease, stage 3a: Secondary | ICD-10-CM | POA: Diagnosis not present

## 2023-02-02 DIAGNOSIS — I480 Paroxysmal atrial fibrillation: Secondary | ICD-10-CM | POA: Diagnosis not present

## 2023-02-02 DIAGNOSIS — R809 Proteinuria, unspecified: Secondary | ICD-10-CM | POA: Diagnosis not present

## 2023-02-17 DIAGNOSIS — E669 Obesity, unspecified: Secondary | ICD-10-CM | POA: Diagnosis not present

## 2023-02-17 DIAGNOSIS — Z79899 Other long term (current) drug therapy: Secondary | ICD-10-CM | POA: Diagnosis not present

## 2023-02-17 DIAGNOSIS — K8012 Calculus of gallbladder with acute and chronic cholecystitis without obstruction: Secondary | ICD-10-CM | POA: Diagnosis not present

## 2023-02-17 DIAGNOSIS — Z87442 Personal history of urinary calculi: Secondary | ICD-10-CM | POA: Diagnosis not present

## 2023-02-17 DIAGNOSIS — Z833 Family history of diabetes mellitus: Secondary | ICD-10-CM | POA: Diagnosis not present

## 2023-02-17 DIAGNOSIS — I4891 Unspecified atrial fibrillation: Secondary | ICD-10-CM | POA: Diagnosis not present

## 2023-02-17 DIAGNOSIS — I10 Essential (primary) hypertension: Secondary | ICD-10-CM | POA: Diagnosis not present

## 2023-02-17 DIAGNOSIS — E113553 Type 2 diabetes mellitus with stable proliferative diabetic retinopathy, bilateral: Secondary | ICD-10-CM | POA: Diagnosis not present

## 2023-02-17 DIAGNOSIS — Z8616 Personal history of COVID-19: Secondary | ICD-10-CM | POA: Diagnosis not present

## 2023-02-17 DIAGNOSIS — Z794 Long term (current) use of insulin: Secondary | ICD-10-CM | POA: Diagnosis not present

## 2023-02-17 DIAGNOSIS — K812 Acute cholecystitis with chronic cholecystitis: Secondary | ICD-10-CM | POA: Diagnosis not present

## 2023-02-17 DIAGNOSIS — Z6832 Body mass index (BMI) 32.0-32.9, adult: Secondary | ICD-10-CM | POA: Diagnosis not present

## 2023-02-17 DIAGNOSIS — Z8249 Family history of ischemic heart disease and other diseases of the circulatory system: Secondary | ICD-10-CM | POA: Diagnosis not present

## 2023-03-03 DIAGNOSIS — Z4659 Encounter for fitting and adjustment of other gastrointestinal appliance and device: Secondary | ICD-10-CM | POA: Diagnosis not present

## 2023-03-03 DIAGNOSIS — N189 Chronic kidney disease, unspecified: Secondary | ICD-10-CM | POA: Diagnosis not present

## 2023-03-03 DIAGNOSIS — K8050 Calculus of bile duct without cholangitis or cholecystitis without obstruction: Secondary | ICD-10-CM | POA: Diagnosis not present

## 2023-03-03 DIAGNOSIS — K862 Cyst of pancreas: Secondary | ICD-10-CM | POA: Diagnosis not present

## 2023-03-03 DIAGNOSIS — Z9049 Acquired absence of other specified parts of digestive tract: Secondary | ICD-10-CM | POA: Diagnosis not present

## 2023-03-03 DIAGNOSIS — E785 Hyperlipidemia, unspecified: Secondary | ICD-10-CM | POA: Diagnosis not present

## 2023-03-03 DIAGNOSIS — E1122 Type 2 diabetes mellitus with diabetic chronic kidney disease: Secondary | ICD-10-CM | POA: Diagnosis not present

## 2023-03-03 DIAGNOSIS — Z8719 Personal history of other diseases of the digestive system: Secondary | ICD-10-CM | POA: Diagnosis not present

## 2023-03-03 DIAGNOSIS — Z9689 Presence of other specified functional implants: Secondary | ICD-10-CM | POA: Diagnosis not present

## 2023-03-03 DIAGNOSIS — I129 Hypertensive chronic kidney disease with stage 1 through stage 4 chronic kidney disease, or unspecified chronic kidney disease: Secondary | ICD-10-CM | POA: Diagnosis not present

## 2023-04-15 DIAGNOSIS — I10 Essential (primary) hypertension: Secondary | ICD-10-CM | POA: Diagnosis not present

## 2023-06-21 DIAGNOSIS — L814 Other melanin hyperpigmentation: Secondary | ICD-10-CM | POA: Diagnosis not present

## 2023-06-21 DIAGNOSIS — L821 Other seborrheic keratosis: Secondary | ICD-10-CM | POA: Diagnosis not present

## 2023-06-21 DIAGNOSIS — L218 Other seborrheic dermatitis: Secondary | ICD-10-CM | POA: Diagnosis not present

## 2023-06-21 DIAGNOSIS — Z85828 Personal history of other malignant neoplasm of skin: Secondary | ICD-10-CM | POA: Diagnosis not present

## 2023-06-21 DIAGNOSIS — D2261 Melanocytic nevi of right upper limb, including shoulder: Secondary | ICD-10-CM | POA: Diagnosis not present

## 2023-06-21 DIAGNOSIS — D224 Melanocytic nevi of scalp and neck: Secondary | ICD-10-CM | POA: Diagnosis not present

## 2023-07-06 DIAGNOSIS — R112 Nausea with vomiting, unspecified: Secondary | ICD-10-CM | POA: Diagnosis not present

## 2023-07-06 DIAGNOSIS — Z860101 Personal history of adenomatous and serrated colon polyps: Secondary | ICD-10-CM | POA: Diagnosis not present

## 2023-07-22 ENCOUNTER — Other Ambulatory Visit: Payer: Self-pay | Admitting: Family Medicine

## 2023-07-22 DIAGNOSIS — I6521 Occlusion and stenosis of right carotid artery: Secondary | ICD-10-CM

## 2023-07-27 DIAGNOSIS — N1831 Chronic kidney disease, stage 3a: Secondary | ICD-10-CM | POA: Diagnosis not present

## 2023-07-30 ENCOUNTER — Other Ambulatory Visit

## 2023-08-03 DIAGNOSIS — R198 Other specified symptoms and signs involving the digestive system and abdomen: Secondary | ICD-10-CM | POA: Diagnosis not present

## 2023-08-03 DIAGNOSIS — R112 Nausea with vomiting, unspecified: Secondary | ICD-10-CM | POA: Diagnosis not present

## 2023-08-03 DIAGNOSIS — Z13810 Encounter for screening for upper gastrointestinal disorder: Secondary | ICD-10-CM | POA: Diagnosis not present

## 2023-08-09 ENCOUNTER — Ambulatory Visit
Admission: RE | Admit: 2023-08-09 | Discharge: 2023-08-09 | Disposition: A | Discharge status: Home / Self Care | Source: Ambulatory Visit | Attending: Family Medicine | Admitting: Family Medicine

## 2023-08-09 DIAGNOSIS — I6521 Occlusion and stenosis of right carotid artery: Secondary | ICD-10-CM

## 2023-08-09 DIAGNOSIS — R0989 Other specified symptoms and signs involving the circulatory and respiratory systems: Secondary | ICD-10-CM | POA: Diagnosis not present

## 2023-08-31 DIAGNOSIS — K5730 Diverticulosis of large intestine without perforation or abscess without bleeding: Secondary | ICD-10-CM | POA: Diagnosis not present

## 2023-08-31 DIAGNOSIS — D128 Benign neoplasm of rectum: Secondary | ICD-10-CM | POA: Diagnosis not present

## 2023-08-31 DIAGNOSIS — Z860101 Personal history of adenomatous and serrated colon polyps: Secondary | ICD-10-CM | POA: Diagnosis not present

## 2023-08-31 DIAGNOSIS — D125 Benign neoplasm of sigmoid colon: Secondary | ICD-10-CM | POA: Diagnosis not present

## 2023-08-31 DIAGNOSIS — K648 Other hemorrhoids: Secondary | ICD-10-CM | POA: Diagnosis not present

## 2023-08-31 DIAGNOSIS — Z1211 Encounter for screening for malignant neoplasm of colon: Secondary | ICD-10-CM | POA: Diagnosis not present

## 2023-08-31 DIAGNOSIS — K635 Polyp of colon: Secondary | ICD-10-CM | POA: Diagnosis not present

## 2023-09-20 DIAGNOSIS — N1831 Chronic kidney disease, stage 3a: Secondary | ICD-10-CM | POA: Diagnosis not present

## 2023-09-20 DIAGNOSIS — L814 Other melanin hyperpigmentation: Secondary | ICD-10-CM | POA: Diagnosis not present

## 2023-09-20 DIAGNOSIS — D239 Other benign neoplasm of skin, unspecified: Secondary | ICD-10-CM | POA: Diagnosis not present

## 2023-09-28 DIAGNOSIS — E1122 Type 2 diabetes mellitus with diabetic chronic kidney disease: Secondary | ICD-10-CM | POA: Diagnosis not present

## 2023-09-28 DIAGNOSIS — N200 Calculus of kidney: Secondary | ICD-10-CM | POA: Diagnosis not present

## 2023-09-28 DIAGNOSIS — N400 Enlarged prostate without lower urinary tract symptoms: Secondary | ICD-10-CM | POA: Diagnosis not present

## 2023-09-28 DIAGNOSIS — I129 Hypertensive chronic kidney disease with stage 1 through stage 4 chronic kidney disease, or unspecified chronic kidney disease: Secondary | ICD-10-CM | POA: Diagnosis not present

## 2023-09-28 DIAGNOSIS — N1832 Chronic kidney disease, stage 3b: Secondary | ICD-10-CM | POA: Diagnosis not present

## 2023-09-28 DIAGNOSIS — R809 Proteinuria, unspecified: Secondary | ICD-10-CM | POA: Diagnosis not present

## 2023-10-13 DIAGNOSIS — Z23 Encounter for immunization: Secondary | ICD-10-CM | POA: Diagnosis not present

## 2023-10-13 DIAGNOSIS — E785 Hyperlipidemia, unspecified: Secondary | ICD-10-CM | POA: Diagnosis not present

## 2023-10-13 DIAGNOSIS — R946 Abnormal results of thyroid function studies: Secondary | ICD-10-CM | POA: Diagnosis not present

## 2023-10-13 DIAGNOSIS — E1122 Type 2 diabetes mellitus with diabetic chronic kidney disease: Secondary | ICD-10-CM | POA: Diagnosis not present

## 2023-10-13 DIAGNOSIS — I6521 Occlusion and stenosis of right carotid artery: Secondary | ICD-10-CM | POA: Diagnosis not present

## 2023-10-13 DIAGNOSIS — N182 Chronic kidney disease, stage 2 (mild): Secondary | ICD-10-CM | POA: Diagnosis not present

## 2023-10-13 DIAGNOSIS — Z794 Long term (current) use of insulin: Secondary | ICD-10-CM | POA: Diagnosis not present

## 2023-10-13 DIAGNOSIS — I10 Essential (primary) hypertension: Secondary | ICD-10-CM | POA: Diagnosis not present

## 2023-10-13 DIAGNOSIS — I480 Paroxysmal atrial fibrillation: Secondary | ICD-10-CM | POA: Diagnosis not present

## 2023-12-02 DIAGNOSIS — E039 Hypothyroidism, unspecified: Secondary | ICD-10-CM | POA: Diagnosis not present

## 2024-01-05 DIAGNOSIS — H029 Unspecified disorder of eyelid: Secondary | ICD-10-CM | POA: Diagnosis not present

## 2024-01-05 DIAGNOSIS — D3131 Benign neoplasm of right choroid: Secondary | ICD-10-CM | POA: Diagnosis not present

## 2024-01-05 DIAGNOSIS — H50012 Monocular esotropia, left eye: Secondary | ICD-10-CM | POA: Diagnosis not present

## 2024-01-05 DIAGNOSIS — H40053 Ocular hypertension, bilateral: Secondary | ICD-10-CM | POA: Diagnosis not present

## 2024-01-05 DIAGNOSIS — E113291 Type 2 diabetes mellitus with mild nonproliferative diabetic retinopathy without macular edema, right eye: Secondary | ICD-10-CM | POA: Diagnosis not present

## 2024-01-26 DIAGNOSIS — E039 Hypothyroidism, unspecified: Secondary | ICD-10-CM | POA: Diagnosis not present

## 2024-01-31 IMAGING — US US CAROTID DUPLEX BILAT
1 series · 13 of 24 positions shown · non-contrast
Comparison: None.

CLINICAL DATA: 75-year-old male with a history of carotid bruit

EXAM:
BILATERAL CAROTID DUPLEX ULTRASOUND
TECHNIQUE: Gray scale imaging, color Doppler and duplex ultrasound were
performed of bilateral carotid and vertebral arteries in the neck.

[Series 1: us carotid duplex bilat · 0.06mm/px · 13 of 59 slices shown]
[im 1/59]
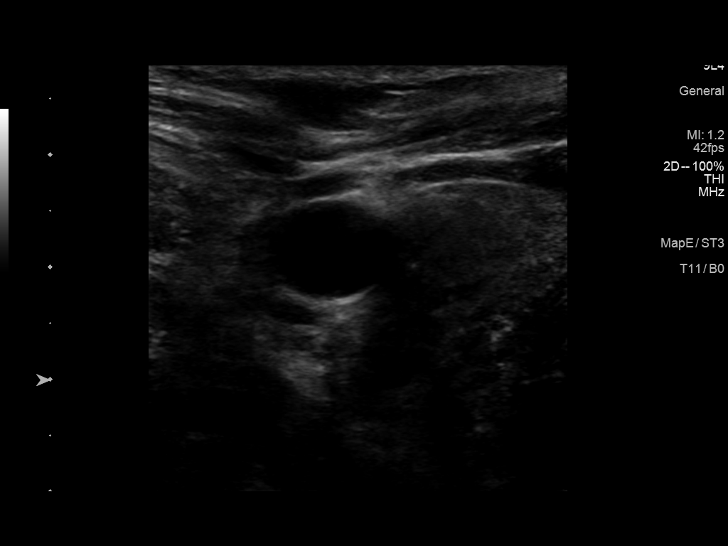
[im 6/59]
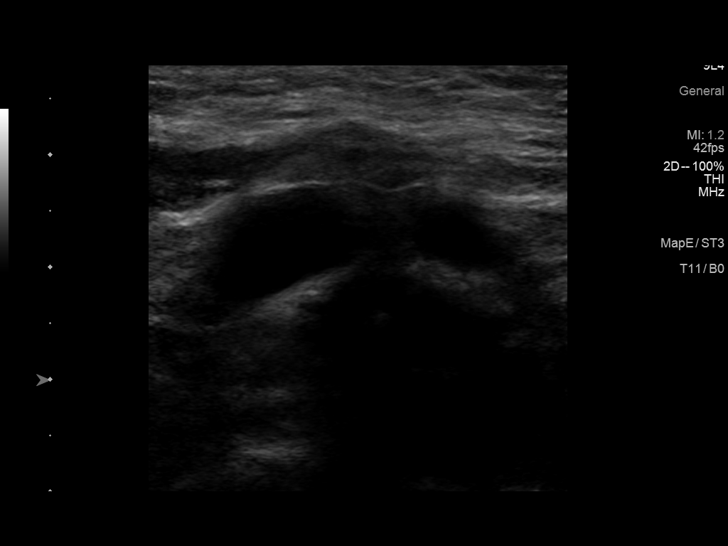
[im 11/59]
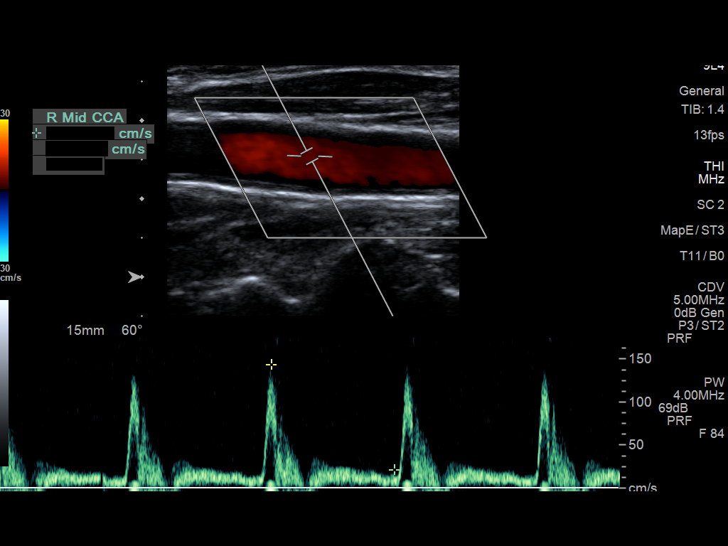
[im 16/59]
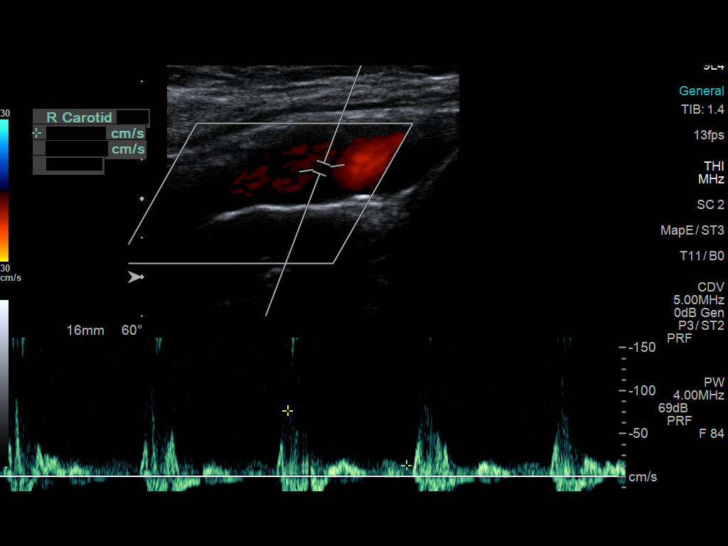
[im 21/59]
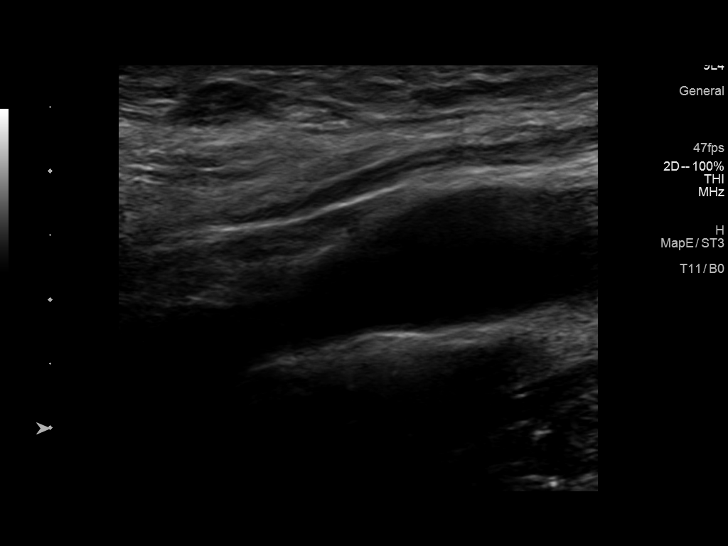
[im 26/59]
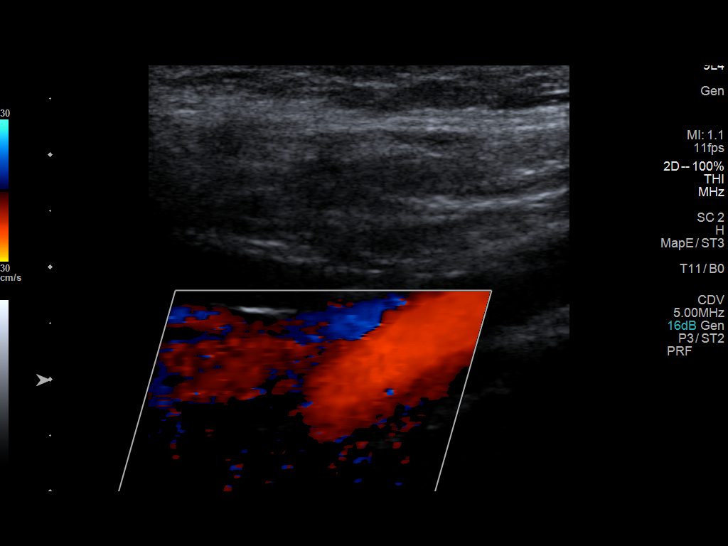
[im 31/59]
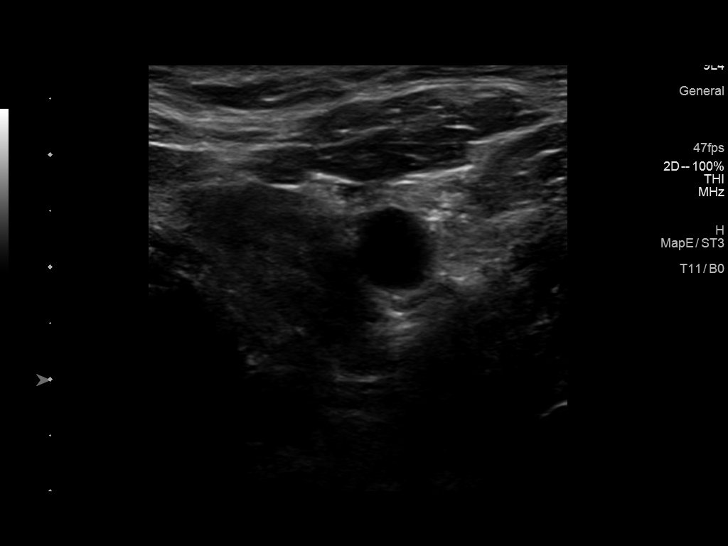
[im 33/59]
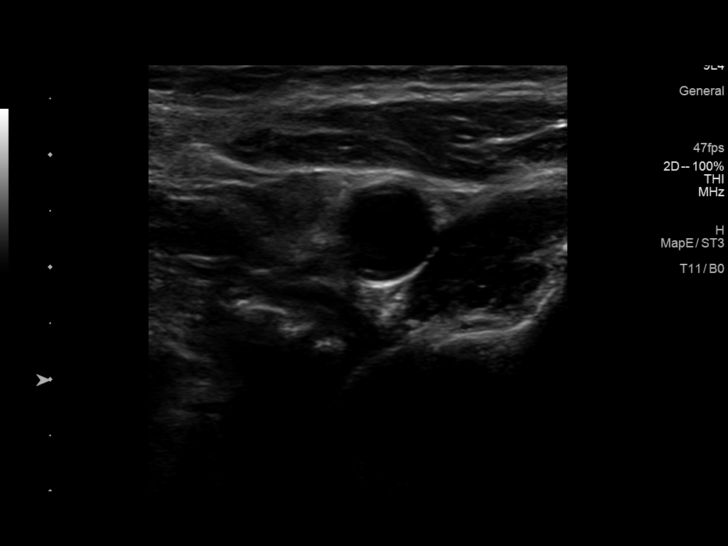
[im 38/59]
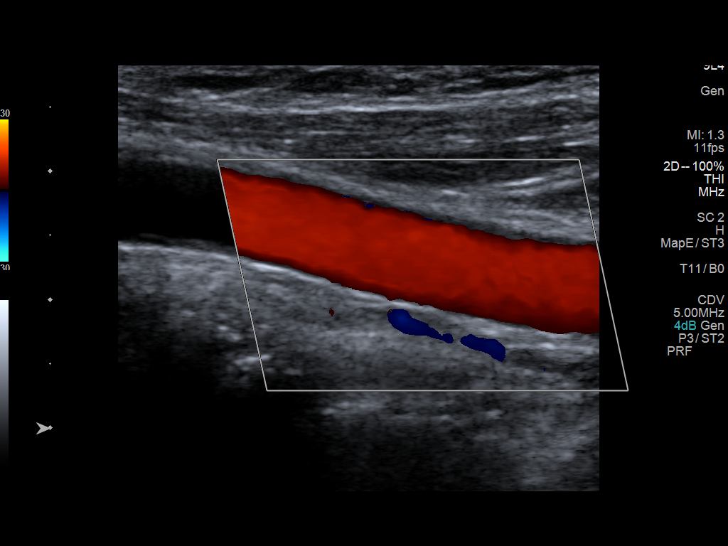
[im 43/59]
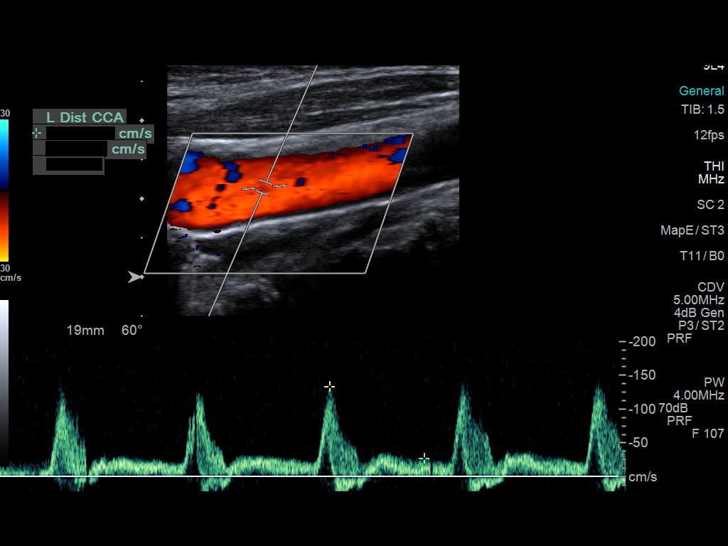
[im 48/59]
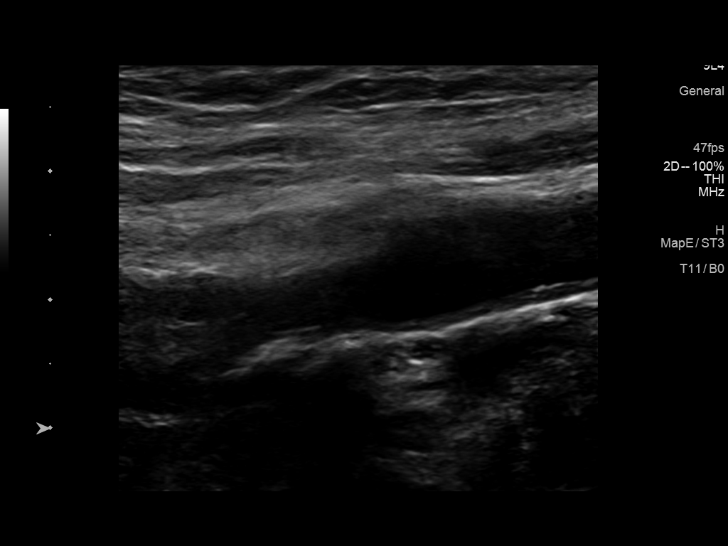
[im 53/59]
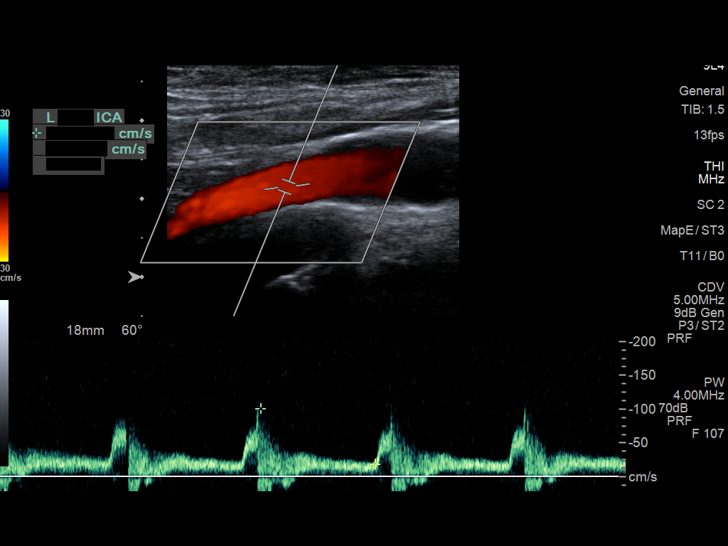
[im 59/59]
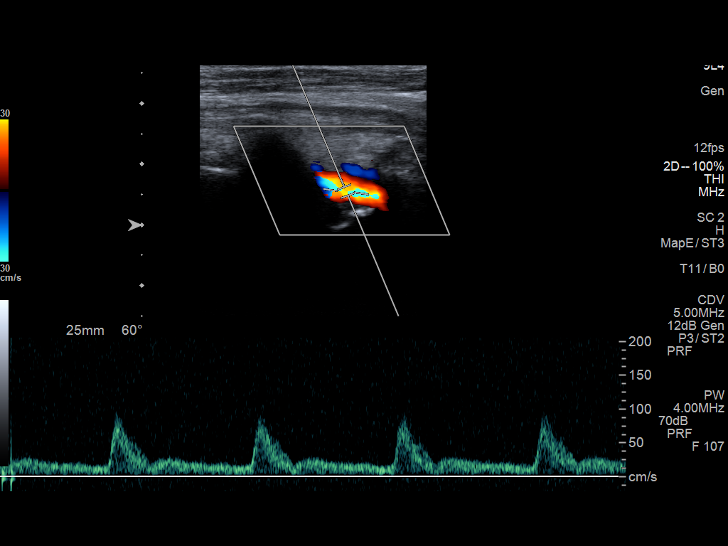

[13 of 24 positions shown; findings below may reference images not displayed]

FINDINGS: Criteria: Quantification of carotid stenosis is based on velocity
parameters that correlate the residual internal carotid diameter
with NASCET-based stenosis levels, using the diameter of the distal
internal carotid lumen as the denominator for stenosis measurement.

The following velocity measurements were obtained:

RIGHT

ICA:  Systolic 148 cm/sec, Diastolic 21 cm/sec

CCA:  121 cm/sec

SYSTOLIC ICA/CCA RATIO:

ECA:  345 cm/sec

LEFT

ICA:  Systolic 116 cm/sec, Diastolic 37 cm/sec

CCA:  165 cm/sec

SYSTOLIC ICA/CCA RATIO:

ECA:  148 cm/sec

Right Brachial SBP: 120

Left Brachial SBP: 139

RIGHT CAROTID ARTERY: No significant calcified disease of the right
common carotid artery. Intermediate waveform maintained. Homogeneous
plaque without significant calcifications at the right carotid
bifurcation. Low resistance waveform of the right ICA. No
significant tortuosity.

RIGHT VERTEBRAL ARTERY: Antegrade flow with low resistance waveform.

LEFT CAROTID ARTERY: No significant calcified disease of the left
common carotid artery. Intermediate waveform maintained. Homogeneous
plaque at the left carotid bifurcation without significant
calcifications. Low resistance waveform of the left ICA.

LEFT VERTEBRAL ARTERY:  Antegrade flow with low resistance waveform.
IMPRESSION: Right:

Homogeneous plaque at the right carotid bifurcation, with discordant
results regarding degree of stenosis by established duplex criteria.
Peak velocity suggests 50%-69% stenosis, with the ICA/ CCA ratio
suggesting a lesser degree of stenosis. If establishing a more
accurate degree of stenosis is required, cerebral angiogram should
be considered, or as a second best test, CTA.

Left:

Color duplex indicates minimal homogeneous plaque, with no
hemodynamically significant stenosis by duplex criteria in the
extracranial cerebrovascular circulation.

## 2024-02-17 DIAGNOSIS — D3131 Benign neoplasm of right choroid: Secondary | ICD-10-CM | POA: Diagnosis not present

## 2024-02-17 DIAGNOSIS — H35372 Puckering of macula, left eye: Secondary | ICD-10-CM | POA: Diagnosis not present

## 2024-02-17 DIAGNOSIS — H3581 Retinal edema: Secondary | ICD-10-CM | POA: Diagnosis not present

## 2024-02-17 DIAGNOSIS — E119 Type 2 diabetes mellitus without complications: Secondary | ICD-10-CM | POA: Diagnosis not present

## 2024-02-17 DIAGNOSIS — H31093 Other chorioretinal scars, bilateral: Secondary | ICD-10-CM | POA: Diagnosis not present
